# Patient Record
Sex: Female | Born: 1955 | Race: White | Hispanic: No | Marital: Married | Smoking: Never smoker
Health system: Southern US, Community
[De-identification: ages and names within clinical notes are randomized; demographics above are authoritative.]

## PROBLEM LIST (undated history)

## (undated) DIAGNOSIS — I340 Nonrheumatic mitral (valve) insufficiency: Secondary | ICD-10-CM

## (undated) DIAGNOSIS — IMO0002 Reserved for concepts with insufficient information to code with codable children: Secondary | ICD-10-CM

## (undated) DIAGNOSIS — D649 Anemia, unspecified: Secondary | ICD-10-CM

## (undated) DIAGNOSIS — K219 Gastro-esophageal reflux disease without esophagitis: Secondary | ICD-10-CM

## (undated) DIAGNOSIS — R079 Chest pain, unspecified: Secondary | ICD-10-CM

## (undated) DIAGNOSIS — F419 Anxiety disorder, unspecified: Secondary | ICD-10-CM

## (undated) DIAGNOSIS — Z87898 Personal history of other specified conditions: Secondary | ICD-10-CM

## (undated) DIAGNOSIS — R9430 Abnormal result of cardiovascular function study, unspecified: Secondary | ICD-10-CM

## (undated) DIAGNOSIS — J45909 Unspecified asthma, uncomplicated: Secondary | ICD-10-CM

## (undated) DIAGNOSIS — R55 Syncope and collapse: Secondary | ICD-10-CM

## (undated) DIAGNOSIS — R002 Palpitations: Secondary | ICD-10-CM

## (undated) DIAGNOSIS — K589 Irritable bowel syndrome without diarrhea: Secondary | ICD-10-CM

## (undated) HISTORY — DX: Irritable bowel syndrome, unspecified: K58.9

## (undated) HISTORY — DX: Reserved for concepts with insufficient information to code with codable children: IMO0002

## (undated) HISTORY — PX: UPPER GASTROINTESTINAL ENDOSCOPY: SHX188

## (undated) HISTORY — DX: Nonrheumatic mitral (valve) insufficiency: I34.0

## (undated) HISTORY — PX: COLONOSCOPY: SHX174

## (undated) HISTORY — DX: Syncope and collapse: R55

## (undated) HISTORY — PX: CHOLECYSTECTOMY: SHX55

## (undated) HISTORY — DX: Gastro-esophageal reflux disease without esophagitis: K21.9

## (undated) HISTORY — PX: STOMACH SURGERY: SHX791

## (undated) HISTORY — DX: Abnormal result of cardiovascular function study, unspecified: R94.30

## (undated) HISTORY — DX: Chest pain, unspecified: R07.9

## (undated) HISTORY — PX: ABDOMINAL HYSTERECTOMY: SHX81

## (undated) HISTORY — DX: Palpitations: R00.2

## (undated) HISTORY — DX: Unspecified asthma, uncomplicated: J45.909

---

## 2001-02-14 ENCOUNTER — Ambulatory Visit (HOSPITAL_COMMUNITY): Admission: RE | Admit: 2001-02-14 | Discharge: 2001-02-14 | Payer: Self-pay | Admitting: Internal Medicine

## 2001-07-31 ENCOUNTER — Ambulatory Visit (HOSPITAL_COMMUNITY): Admission: RE | Admit: 2001-07-31 | Discharge: 2001-07-31 | Payer: Self-pay | Admitting: Internal Medicine

## 2001-10-18 ENCOUNTER — Encounter (INDEPENDENT_AMBULATORY_CARE_PROVIDER_SITE_OTHER): Payer: Self-pay | Admitting: Internal Medicine

## 2001-10-18 ENCOUNTER — Ambulatory Visit (HOSPITAL_COMMUNITY): Admission: RE | Admit: 2001-10-18 | Discharge: 2001-10-18 | Payer: Self-pay | Admitting: Internal Medicine

## 2001-11-02 ENCOUNTER — Ambulatory Visit (HOSPITAL_COMMUNITY): Admission: RE | Admit: 2001-11-02 | Discharge: 2001-11-02 | Payer: Self-pay | Admitting: Internal Medicine

## 2002-05-27 ENCOUNTER — Ambulatory Visit (HOSPITAL_COMMUNITY): Admission: RE | Admit: 2002-05-27 | Discharge: 2002-05-27 | Payer: Self-pay | Admitting: Oral Surgery

## 2002-05-27 ENCOUNTER — Encounter: Payer: Self-pay | Admitting: Oral Surgery

## 2003-03-29 ENCOUNTER — Encounter: Payer: Self-pay | Admitting: Cardiology

## 2003-07-11 ENCOUNTER — Ambulatory Visit (HOSPITAL_COMMUNITY): Admission: RE | Admit: 2003-07-11 | Discharge: 2003-07-11 | Payer: Self-pay | Admitting: Internal Medicine

## 2004-03-19 ENCOUNTER — Ambulatory Visit (HOSPITAL_COMMUNITY): Admission: RE | Admit: 2004-03-19 | Discharge: 2004-03-19 | Payer: Self-pay | Admitting: Internal Medicine

## 2004-04-13 ENCOUNTER — Ambulatory Visit (HOSPITAL_COMMUNITY): Admission: RE | Admit: 2004-04-13 | Discharge: 2004-04-13 | Payer: Self-pay | Admitting: Internal Medicine

## 2004-04-23 ENCOUNTER — Ambulatory Visit (HOSPITAL_COMMUNITY): Admission: RE | Admit: 2004-04-23 | Discharge: 2004-04-23 | Payer: Self-pay | Admitting: Internal Medicine

## 2004-09-03 ENCOUNTER — Ambulatory Visit: Payer: Self-pay | Admitting: Internal Medicine

## 2004-09-28 ENCOUNTER — Ambulatory Visit: Payer: Self-pay | Admitting: Internal Medicine

## 2004-09-28 ENCOUNTER — Ambulatory Visit (HOSPITAL_COMMUNITY): Admission: RE | Admit: 2004-09-28 | Discharge: 2004-09-28 | Payer: Self-pay | Admitting: Internal Medicine

## 2004-11-16 ENCOUNTER — Ambulatory Visit: Payer: Self-pay | Admitting: Internal Medicine

## 2005-02-22 ENCOUNTER — Ambulatory Visit: Payer: Self-pay | Admitting: Internal Medicine

## 2005-03-17 ENCOUNTER — Ambulatory Visit: Payer: Self-pay | Admitting: Internal Medicine

## 2005-03-17 ENCOUNTER — Ambulatory Visit (HOSPITAL_COMMUNITY): Admission: RE | Admit: 2005-03-17 | Discharge: 2005-03-17 | Payer: Self-pay | Admitting: Internal Medicine

## 2005-06-14 ENCOUNTER — Ambulatory Visit: Payer: Self-pay | Admitting: Internal Medicine

## 2005-09-08 ENCOUNTER — Ambulatory Visit: Payer: Self-pay | Admitting: Internal Medicine

## 2005-09-08 ENCOUNTER — Ambulatory Visit (HOSPITAL_COMMUNITY): Admission: RE | Admit: 2005-09-08 | Discharge: 2005-09-08 | Payer: Self-pay | Admitting: Internal Medicine

## 2005-09-20 ENCOUNTER — Ambulatory Visit: Payer: Self-pay | Admitting: Internal Medicine

## 2006-02-21 ENCOUNTER — Ambulatory Visit: Payer: Self-pay | Admitting: Internal Medicine

## 2006-03-15 ENCOUNTER — Ambulatory Visit: Payer: Self-pay | Admitting: Internal Medicine

## 2006-04-13 ENCOUNTER — Ambulatory Visit: Payer: Self-pay | Admitting: Internal Medicine

## 2006-04-28 ENCOUNTER — Ambulatory Visit: Payer: Self-pay | Admitting: Internal Medicine

## 2006-05-02 ENCOUNTER — Ambulatory Visit (HOSPITAL_COMMUNITY): Admission: RE | Admit: 2006-05-02 | Discharge: 2006-05-02 | Payer: Self-pay | Admitting: Internal Medicine

## 2006-05-02 ENCOUNTER — Ambulatory Visit: Payer: Self-pay | Admitting: Internal Medicine

## 2006-05-02 ENCOUNTER — Encounter (INDEPENDENT_AMBULATORY_CARE_PROVIDER_SITE_OTHER): Payer: Self-pay | Admitting: Specialist

## 2006-06-29 ENCOUNTER — Ambulatory Visit: Payer: Self-pay | Admitting: Internal Medicine

## 2006-10-19 ENCOUNTER — Ambulatory Visit: Payer: Self-pay | Admitting: Internal Medicine

## 2007-02-16 ENCOUNTER — Ambulatory Visit: Payer: Self-pay | Admitting: Internal Medicine

## 2007-06-13 ENCOUNTER — Encounter: Payer: Self-pay | Admitting: Cardiology

## 2007-06-13 ENCOUNTER — Ambulatory Visit: Payer: Self-pay | Admitting: Family Medicine

## 2007-06-19 ENCOUNTER — Ambulatory Visit: Payer: Self-pay | Admitting: Cardiology

## 2007-07-24 ENCOUNTER — Ambulatory Visit: Payer: Self-pay | Admitting: Cardiology

## 2007-08-28 ENCOUNTER — Encounter: Payer: Self-pay | Admitting: Physician Assistant

## 2007-08-28 ENCOUNTER — Ambulatory Visit: Payer: Self-pay | Admitting: Cardiology

## 2007-09-05 ENCOUNTER — Encounter: Payer: Self-pay | Admitting: Cardiology

## 2007-09-18 ENCOUNTER — Ambulatory Visit: Payer: Self-pay | Admitting: Cardiology

## 2007-10-02 ENCOUNTER — Encounter: Payer: Self-pay | Admitting: Cardiology

## 2007-10-02 ENCOUNTER — Ambulatory Visit (HOSPITAL_COMMUNITY): Admission: RE | Admit: 2007-10-02 | Discharge: 2007-10-02 | Payer: Self-pay | Admitting: Cardiology

## 2007-10-02 ENCOUNTER — Ambulatory Visit: Payer: Self-pay | Admitting: Internal Medicine

## 2007-10-20 ENCOUNTER — Encounter: Payer: Self-pay | Admitting: Cardiology

## 2007-10-30 ENCOUNTER — Ambulatory Visit: Payer: Self-pay | Admitting: Cardiology

## 2009-03-18 ENCOUNTER — Encounter: Payer: Self-pay | Admitting: Cardiology

## 2009-03-27 ENCOUNTER — Encounter: Payer: Self-pay | Admitting: Cardiology

## 2009-05-16 ENCOUNTER — Encounter: Payer: Self-pay | Admitting: Cardiology

## 2009-05-26 ENCOUNTER — Ambulatory Visit: Payer: Self-pay | Admitting: Cardiology

## 2009-05-26 DIAGNOSIS — E785 Hyperlipidemia, unspecified: Secondary | ICD-10-CM

## 2009-05-26 DIAGNOSIS — R002 Palpitations: Secondary | ICD-10-CM

## 2009-05-26 DIAGNOSIS — R55 Syncope and collapse: Secondary | ICD-10-CM

## 2009-05-26 DIAGNOSIS — K222 Esophageal obstruction: Secondary | ICD-10-CM | POA: Insufficient documentation

## 2009-05-26 DIAGNOSIS — K589 Irritable bowel syndrome without diarrhea: Secondary | ICD-10-CM

## 2009-05-26 DIAGNOSIS — I2789 Other specified pulmonary heart diseases: Secondary | ICD-10-CM | POA: Insufficient documentation

## 2009-06-06 ENCOUNTER — Telehealth: Payer: Self-pay | Admitting: Physician Assistant

## 2009-07-04 ENCOUNTER — Encounter: Payer: Self-pay | Admitting: Cardiology

## 2009-07-05 ENCOUNTER — Encounter: Payer: Self-pay | Admitting: Cardiology

## 2009-07-07 ENCOUNTER — Telehealth (INDEPENDENT_AMBULATORY_CARE_PROVIDER_SITE_OTHER): Payer: Self-pay | Admitting: *Deleted

## 2009-07-07 ENCOUNTER — Encounter: Payer: Self-pay | Admitting: Cardiology

## 2009-07-14 ENCOUNTER — Encounter: Payer: Self-pay | Admitting: Cardiology

## 2009-07-14 ENCOUNTER — Ambulatory Visit: Payer: Self-pay | Admitting: Cardiology

## 2009-08-04 ENCOUNTER — Encounter: Payer: Self-pay | Admitting: Cardiology

## 2009-08-18 ENCOUNTER — Encounter: Payer: Self-pay | Admitting: Cardiology

## 2009-10-20 ENCOUNTER — Encounter: Payer: Self-pay | Admitting: Cardiology

## 2009-10-22 ENCOUNTER — Ambulatory Visit: Payer: Self-pay | Admitting: Cardiology

## 2009-10-22 DIAGNOSIS — R0602 Shortness of breath: Secondary | ICD-10-CM

## 2009-10-22 DIAGNOSIS — R0789 Other chest pain: Secondary | ICD-10-CM

## 2009-11-11 DEATH — deceased

## 2009-12-29 ENCOUNTER — Encounter: Payer: Self-pay | Admitting: Cardiology

## 2010-01-14 ENCOUNTER — Encounter: Payer: Self-pay | Admitting: Cardiology

## 2010-04-06 ENCOUNTER — Encounter: Payer: Self-pay | Admitting: Cardiology

## 2010-04-17 ENCOUNTER — Encounter: Payer: Self-pay | Admitting: Cardiology

## 2010-05-11 ENCOUNTER — Encounter: Payer: Self-pay | Admitting: Cardiology

## 2010-05-22 ENCOUNTER — Encounter: Payer: Self-pay | Admitting: Cardiology

## 2010-06-04 ENCOUNTER — Ambulatory Visit: Payer: Self-pay | Admitting: Cardiology

## 2010-06-04 ENCOUNTER — Encounter (INDEPENDENT_AMBULATORY_CARE_PROVIDER_SITE_OTHER): Payer: Self-pay | Admitting: *Deleted

## 2010-07-13 ENCOUNTER — Ambulatory Visit: Payer: Self-pay | Admitting: Internal Medicine

## 2010-07-24 ENCOUNTER — Ambulatory Visit: Payer: Self-pay | Admitting: Internal Medicine

## 2010-07-24 ENCOUNTER — Ambulatory Visit (HOSPITAL_COMMUNITY): Admission: RE | Admit: 2010-07-24 | Discharge: 2010-07-24 | Payer: Self-pay | Admitting: Internal Medicine

## 2010-08-31 ENCOUNTER — Ambulatory Visit: Payer: Self-pay | Admitting: Internal Medicine

## 2010-10-13 NOTE — Assessment & Plan Note (Signed)
Summary: 6 month fu recv reminder   Visit Type:  Follow-up Primary Provider:  Dr. Doreen Beam   History of Present Illness: 55 year old female, with no documented history of CAD, but with history of long-standing chest pain and chronic dyspnea, presents for scheduled followup.  Since her last visit, she has undergone extensive workup: She was briefly hospitalized here at [facility] in October for chest pain, and was released with plans for an outpatient stress test. This was once again negative, as had been the case in October 2008. She also had normal LVF by 2-D echo; however, it suggested a dilated IVC, and a followup ultrasound was ordered. This suggested only mildly dilated IVC, with no evidence of occlusion. She also underwent recent PFT testing, per Dr. Sherril Croon, which indicated normal lung volumes and moderate decrease in diffusing capacity. Of note, she had had previous formal PFTs, which we ordered, which indicated mild airflow obstruction (Gold stage II), with normal DLCO.   In addition, patient also has history of mild pulmonary hypertension by previous echocardiography. This current study, however, did not suggest any significant findings. Patient has also been formally evaluated by Dr. Cherie Ouch, in the past.  Clinically, she continues to complain of intermittent chest pain, which is unpredictable in onset and brief in duration. She also complains of chronic dyspnea. At time of last visit, she was placed on low dose Toprol for palpitations, but subsequently decreased this to 12.5 mg daily, citing low blood pressure readings at home. She also has not had any recurrent syncope, which was felt to be vasovagal in nature.    Preventive Screening-Counseling & Management  Alcohol-Tobacco     Smoking Status: never  Current Medications (verified): 1)  Nexium 40 Mg Cpdr (Esomeprazole Magnesium) .... Take 1 Capsule By Mouth Two Times A Day 2)  Alprazolam 0.5 Mg Tabs (Alprazolam) .... Take  1 Tablet By Mouth Two Times A Day As Needed 3)  Bentyl 20 Mg Tabs (Dicyclomine Hcl) .... Take 1 Tablet By Mouth Twice Times A Day 4)  Multivitamins   Tabs (Multiple Vitamin) .... Daily 5)  Vitamin B-12 2500 Mcg Subl (Cyanocobalamin) .... Take 1/2 Daily 6)  Vitamin B Complex-C  Caps (B Complex-C) .... Take 1 Capsule By Mouth Once A Day 7)  Toprol Xl 25 Mg Xr24h-Tab (Metoprolol Succinate) .... Take 1 Tablet By Mouth Once A Day 8)  Sucralfate 1 Gm Tabs (Sucralfate) .... Take One By Mouth Ac & Hs 9)  Azelastine Hcl 137 Mcg/spray Soln (Azelastine Hcl) .... Two Sprays/nostrils Two Times A Day 10)  Ventolin Hfa 108 (90 Base) Mcg/act Aers (Albuterol Sulfate) .... As Needed 11)  Trazodone Hcl 50 Mg Tabs (Trazodone Hcl) .... Take 1 Tablet By Mouth Once A Day At Betime As Needed 12)  Anucort-Hc 25 Mg Supp (Hydrocortisone Acetate) .... As Needed 13)  Atrovent Hfa 17 Mcg/act Aers (Ipratropium Bromide Hfa) .... One Puff As Needed  Allergies (verified): 1)  Darvocet 2)  Codeine 3)  * Fentanyl  Comments:  Nurse/Medical Assistant: The patient's medications and allergies were reviewed with the patient and were updated in the Medication and Allergy Lists. Bottles reviewed.  Past History:  Past Medical History: Last updated: 05/26/2009  1. She has chronic GERD.and stricture  2. History of gastric ulcer which was subsequently documented to have      healed completely.  Her H pylori serologies have been negative.  3. She also has history of dysphagia.  Last EGD was in December2006.  She  had mild changes of reflux esophagitis, limited GE junction and      incomplete ring. dyslipidemia multiple pulmonary hypertension anemia resolved irritable bowel syndrome chronic dyspnea status-post CPX  Review of Systems       No fevers, chills, hemoptysis, dysphagia, melena, hematocheezia, hematuria, rash, claudication, orthopnea, pnd, pedal edema. no recurrent syncope. Occasional palpitations. All other  systems negative.   Vital Signs:  Patient profile:   55 year old female Height:      64 inches Weight:      108 pounds Pulse rate:   69 / minute BP sitting:   110 / 74  (left arm) Cuff size:   regular  Vitals Entered By: Carlye Grippe (October 22, 2009 10:37 AM)   Physical Exam  Additional Exam:  GEN:55 year old female, sitting upright, in no distress HEENT: NCAT,PERRLA,EOMI NECK: palpable pulses, no bruits; no JVD; no TM LUNGS: CTA bilaterally HEART: RRR (S1S2); no significant murmurs; no rubs; no gallops ABD: soft, NT; intact BS EXT: intact distal pulses; no edema SKIN: warm, dry MUSC: no obvious deformity NEURO: A/O (x3)     Impression & Recommendations:  Problem # 1:  CHEST PAIN, ATYPICAL (ICD-786.59)  patient has long-standing history of atypical chest pain, and most recently had her second exercise stress Cardiolite, once again negative for ischemia. She does not suggest any change from her baseline pattern; therefore, no further ischemic testing is indicated. Moreover, a recent 2-D echo was also, once again, within normal limits. There was some suggestion of a dilated IVC; however, followup ultrasound suggested that this was only mildly dilated, and with no evidence of obstruction. This workup was initiated by Dr. Sherril Croon. We'll plan on having her return to Dr. Andee Lineman in 6 months, or sooner if needed.  Problem # 2:  PALPITATIONS, RECURRENT (ICD-785.1)  continue low dose Toprol.  Problem # 3:  DYSPNEA (ICD-786.05)  Will substitute albuterol with Atrovent MDI, to minimize the potential for inducing tachypalpitations.  Patient Instructions: 1)  Stop Albuterol MDI 2)  Atrovent MDI - may use one puff as needed  3)  Follow up in  6 months.   Prescriptions: ATROVENT HFA 17 MCG/ACT AERS (IPRATROPIUM BROMIDE HFA) one puff as needed  #1 x 0   Entered by:   Hoover Brunette, LPN   Authorized by:   Lewayne Bunting, MD, [facility]   Signed by:   Hoover Brunette, LPN on 16/06/9603   Method  used:   Electronically to        [facility] (retail)       [address]       [place], [place]  [address]       Ph: [phone]       Fax: [phone]   RxID:   [id number]   Handout requested.

## 2010-10-13 NOTE — Letter (Signed)
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 [address]   [place], [place] [address]   Phone: [phone]  Fax: [phone]     June 04, 2010    [facility]   The above named patient had a medical visit today at:  [facility].  Please take this into consideration when reviewing the time away from work/school.      Sincerely yours,  Architectural technologist

## 2010-10-13 NOTE — Assessment & Plan Note (Signed)
Summary: 6 mo fu per aug reminder   Visit Type:  Follow-up Primary Lindsay Lawrence:  Dr. Doreen Beam   History of Present Illness: the patient is a 55 year old female with no prior history of documented CAD.  She has a long-standing history of chest pain and chronic dyspnea.  Please see details note from February 2011.  The patient had a negative Lexiscan in November of 2010.  She complains of reflux and possible surgical spasm.  She has been using nitroglycerin with some success.  However she has no exertional chest pain.  From a cardiovascular standpoint she appears to be stable.  Preventive Screening-Counseling & Management  Alcohol-Tobacco     Smoking Status: never  Current Medications (verified): 1)  Nexium 40 Mg Cpdr (Esomeprazole Magnesium) .... Take 1 Capsule By Mouth Two Times A Day 2)  Alprazolam 0.5 Mg Tabs (Alprazolam) .... Take 1 Tablet By Mouth Two Times A Day As Needed 3)  Multivitamins   Tabs (Multiple Vitamin) .... Daily 4)  Vitamin B-12 2500 Mcg Subl (Cyanocobalamin) .... Take 1/2 Daily 5)  Vitamin B Complex-C  Caps (B Complex-C) .... Take 1 Capsule By Mouth Once A Day 6)  Toprol Xl 25 Mg Xr24h-Tab (Metoprolol Succinate) .... Take 1/2 Tablet By Mouth Once A Day 7)  Amitriptyline Hcl 10 Mg Tabs (Amitriptyline Hcl) .... Take 1 Tablet By Mouth Once A Day 8)  Flonase 50 Mcg/act Susp (Fluticasone Propionate) .... As Needed 9)  Ergocalciferol 8000 Unit/ml Soln (Ergocalciferol) .... Take 1.75ml By Mouth Once Daily. 10)  Fiber 625 Mg Tabs (Calcium Polycarbophil) .... Take 1 Tablet By Mouth Four Times A Day 11)  Magnesium 250 Mg Tabs (Magnesium) .... Take 1 Tablet By Mouth Once A Day 12)  Canasa 1000 Mg Supp (Mesalamine) .... As Needed At Bedtime 13)  Nitrostat 0.4 Mg Subl (Nitroglycerin) .... Dissolve One Tablet Under Tongue For Severe Chest Pain As Needed Every 5 Minutes, Not To Exceed 3 in 15 Min Time Frame  Allergies: 1)  Darvocet 2)  Codeine 3)  * Fentanyl 4)  Asa 5)   Nsaids  Comments:  Nurse/Medical Assistant: The patient's medications were reviewed with the patient and were updated in the Medication List. Pt brought medication bottles to office visit.  Cyril Loosen, RN, BSN (June 04, 2010 3:04 PM)  Past History:  Past Surgical History: Last updated: 05/23/2009  hysterectomy   Family History: Last updated: 05/23/2009   She has one brother and three sisters and they all have  abdominal problems but details unknown.  Father had MI at 108 and died at 69  of another MI.  Mother is doing fairly well at 69.  Social History: Last updated: 05/23/2009  She is married.  She has been working at [employer] in [place],  [place], for the last 13 years.  She does not have any children.  She  has never smoked cigarettes or drank alcohol.  Her husband has Crohn's  disease and has been sick for years.  Risk Factors: Smoking Status: never (06/04/2010)  Past Medical History:  1. She has chronic GERD.and stricture  2. History of gastric ulcer which was subsequently documented to have      healed completely.  Her H pylori serologies have been negative.  3. She also has history of dysphagia.  Last EGD was in December2006.  She      had mild changes of reflux esophagitis, limited GE junction and      incomplete ring. dyslipidemia multiple pulmonary hypertension anemia resolved  irritable bowel syndrome chronic dyspnea status-post CPX, mildly decreased DLCO but otherwise normal pulmonary function studies vitamin D deficiency esophageal spasm.  Review of Systems       The patient complains of chest pain and dyspnea on exertion.  The patient denies fatigue, malaise, fever, weight gain/loss, vision loss, decreased hearing, hoarseness, palpitations, shortness of breath, prolonged cough, wheezing, sleep apnea, coughing up blood, abdominal pain, blood in stool, nausea, vomiting, diarrhea, heartburn, incontinence, blood in urine, muscle weakness, joint  pain, leg swelling, rash, skin lesions, headache, fainting, dizziness, depression, anxiety, enlarged lymph nodes, easy bruising or bleeding, and environmental allergies.    Vital Signs:  Patient profile:   55 year old female Height:      63 inches Weight:      114.25 pounds BMI:     20.31 Pulse rate:   61 / minute BP sitting:   93 / 64  (left arm) Cuff size:   regular  Vitals Entered By: Cyril Loosen, RN, BSN (June 04, 2010 2:58 PM) Comments Follow up office visit. No cardiac complaints.   Physical Exam  Additional Exam:  GEN:55 year old female, sitting upright, in no distress HEENT: NCAT,PERRLA,EOMI NECK: palpable pulses, no bruits; no JVD; no TM LUNGS: CTA bilaterally HEART: RRR (S1S2); no significant murmurs; no rubs; no gallops ABD: soft, NT; intact BS EXT: intact distal pulses; no edema SKIN: warm, dry MUSC: no obvious deformity NEURO: A/O (x3)     EKG  Procedure date:  06/04/2010  Findings:      sinus rhythm with occasional PVCs otherwise normal tracing heart rate 72 beats/min  Impression & Recommendations:  Problem # 1:  DYSPNEA (ICD-786.05) no evidence of ischemia.  EKG is within normal limits.  Negative stress test in the recent past.  Abnormal DLCO otherwise normal pulmonary function studies Her updated medication list for this problem includes:    Toprol Xl 25 Mg Xr24h-tab (Metoprolol succinate) .Marland Kitchen... Take 1/2 tablet by mouth once a day  Problem # 2:  ESOPHAGEAL STRICTURE (ICD-530.3) patient appearsto have esophageall spasm.  She can use p.r.n. nitroglycerin.  Problem # 3:  PALPITATIONS, RECURRENT (ICD-785.1) stable. Her updated medication list for this problem includes:    Toprol Xl 25 Mg Xr24h-tab (Metoprolol succinate) .Marland Kitchen... Take 1/2 tablet by mouth once a day    Nitrostat 0.4 Mg Subl (Nitroglycerin) .Marland Kitchen... Dissolve one tablet under tongue for severe chest pain as needed every 5 minutes, not to exceed 3 in 15 min time frame  Patient  Instructions: 1)  Nitroglycerin as needed for severe chest pain 2)  Follow up in  1 year Prescriptions: TOPROL XL 25 MG XR24H-TAB (METOPROLOL SUCCINATE) Take 1/2 tablet by mouth once a day  #15 x 11   Entered by:   Hoover Brunette, LPN   Authorized by:   Lewayne Bunting, MD, [facility]   Signed by:   Hoover Brunette, LPN on 09/81/1914   Method used:   Electronically to        [facility] ([facility])       [address]       [place], [place]  [address]       Ph: [phone]       Fax: [phone]   RxID:   [id number] NITROSTAT 0.4 MG SUBL (NITROGLYCERIN) dissolve one tablet under tongue for severe chest pain as needed every 5 minutes, not to exceed 3 in 15 min time frame  #25 x 3  Entered by:   Hoover Brunette, LPN   Authorized by:   Lewayne Bunting, MD, [facility]   Signed by:   Hoover Brunette, LPN on 16/06/9603   Method used:   Electronically to        [facility]ret[facility])       [address]       [place], [place]  [address]       Ph: [phone]       Fax: [phone]   RxID:   [id number]

## 2010-10-13 NOTE — Letter (Signed)
Summary: [facility] D/C DR. DHRUV VYAS  [facility] D[facility]C DR. DHRUV VYAS   Imported By: Zachary George 10/22/2009 09:51:40  _____________________________________________________________________  External Attachment:    Type:   Image     Comment:   External Document

## 2010-11-09 ENCOUNTER — Ambulatory Visit (INDEPENDENT_AMBULATORY_CARE_PROVIDER_SITE_OTHER): Payer: BC Managed Care – PPO | Admitting: Internal Medicine

## 2010-11-09 DIAGNOSIS — Z862 Personal history of diseases of the blood and blood-forming organs and certain disorders involving the immune mechanism: Secondary | ICD-10-CM

## 2010-11-09 DIAGNOSIS — E559 Vitamin D deficiency, unspecified: Secondary | ICD-10-CM

## 2010-11-09 DIAGNOSIS — K273 Acute peptic ulcer, site unspecified, without hemorrhage or perforation: Secondary | ICD-10-CM

## 2010-11-09 DIAGNOSIS — K626 Ulcer of anus and rectum: Secondary | ICD-10-CM

## 2010-11-24 LAB — SALICYLATE LEVEL: Salicylate Lvl: <4 mg/dL (ref 2.8–20.0)

## 2010-12-28 ENCOUNTER — Ambulatory Visit (INDEPENDENT_AMBULATORY_CARE_PROVIDER_SITE_OTHER): Payer: BC Managed Care – PPO | Admitting: Internal Medicine

## 2010-12-28 DIAGNOSIS — R1310 Dysphagia, unspecified: Secondary | ICD-10-CM

## 2010-12-28 DIAGNOSIS — K219 Gastro-esophageal reflux disease without esophagitis: Secondary | ICD-10-CM

## 2011-01-01 ENCOUNTER — Ambulatory Visit (HOSPITAL_COMMUNITY)
Admission: RE | Admit: 2011-01-01 | Discharge: 2011-01-01 | Disposition: A | Discharge status: Home / Self Care | Payer: BC Managed Care – PPO | Source: Ambulatory Visit | Attending: Internal Medicine | Admitting: Internal Medicine

## 2011-01-01 ENCOUNTER — Encounter (HOSPITAL_BASED_OUTPATIENT_CLINIC_OR_DEPARTMENT_OTHER): Payer: BC Managed Care – PPO | Admitting: Internal Medicine

## 2011-01-01 DIAGNOSIS — K222 Esophageal obstruction: Secondary | ICD-10-CM

## 2011-01-01 DIAGNOSIS — R1310 Dysphagia, unspecified: Secondary | ICD-10-CM

## 2011-01-01 DIAGNOSIS — I10 Essential (primary) hypertension: Secondary | ICD-10-CM | POA: Insufficient documentation

## 2011-01-01 DIAGNOSIS — Z79899 Other long term (current) drug therapy: Secondary | ICD-10-CM | POA: Insufficient documentation

## 2011-01-01 DIAGNOSIS — K259 Gastric ulcer, unspecified as acute or chronic, without hemorrhage or perforation: Secondary | ICD-10-CM

## 2011-01-01 DIAGNOSIS — K219 Gastro-esophageal reflux disease without esophagitis: Secondary | ICD-10-CM

## 2011-01-01 DIAGNOSIS — K257 Chronic gastric ulcer without hemorrhage or perforation: Secondary | ICD-10-CM | POA: Insufficient documentation

## 2011-01-17 NOTE — Op Note (Signed)
NAMEKURSTYN, LARIOS                ACCOUNT NO.:  [id number]  MEDICAL RECORD NO.:  [id number]           PATIENT TYPE:  O  LOCATION:  DAYP                          FACILITY:  [facility]  PHYSICIAN:  Lionel December, M.D.    DATE OF BIRTH:  13-Jan-1956  DATE OF PROCEDURE:  01/01/2011 DATE OF DISCHARGE:                              OPERATIVE REPORT   PROCEDURE:  Esophagogastroduodenoscopy with esophageal dilation.  INDICATION:  Lindsay Lawrence is 55 year old Caucasian female with chronic GERD, history of Schatzki's ring who presents with recurrent solid food dysphagia.  She has had multiple dilation in the past most recently in November 2011.  She also has nonhealing peptic ulcer disease. Procedures were reviewed with the patient.  Informed consent was obtained.  MEDS FOR CONSCIOUS SEDATION:  Cetacaine spray for oropharyngeal topical anesthesia, Demerol 40 mg IV, Versed 7 mg IV.  FINDINGS:  Procedure performed in endoscopy suite.  The patient's vital signs and O2 sat were monitored during the procedure and remained stable.  The patient was placed in left lateral recumbent position and Pentax videoscope was passed through oropharynx without any difficulty into esophagus.  Esophagus.  Mucosa of the esophagus was unremarkable.  GE junction was located 40 cm from the incisors and appeared to be noncritical narrowing in this level, however, there was no obvious ring noted.  Stomach.  Other than small amount of bile it was empty.  Distended very well by insufflation.  In the gastric body, there were some linear scars and a 3-mm ulcer along the posterior wall.  In the antrum, there were two ulcers, one was about 5-6 mm along the posterior wall and one distal to it was about 10-12 mm long and 4-5 mm wide.  There was another scar proximal to the smaller ulcer.  Pyloric channel was patent.  However, there were two ulcers involving the pyloric channel, one at 7- 8 o'clock, another one towards 4 o'clock.   One on the right site was maybe 4-5 mm, other one was slightly larger.  Angularis, fundus, and cardia were examined by retroflexion of scope and were normal.  Duodenum.  Bulbar mucosa was normal.  The scar along the medial wall site previous ulceration.  Scope was passed in second part of duodenal mucosa and folds were normal.  Endoscope was withdrawn.  Esophageal dilation was performed by passing 54-French followed by 56- French Maloney dilator to full insertion.  As the dilator was withdrawn, endoscope was passed again and esophageal mucosa reexamined.  There was a very small focal mucosal disruption at GE junction.  Endoscope was withdrawn.  The patient tolerated the procedure well.  FINAL DIAGNOSES: 1. Noncritical narrowing or stricture at GE junction. 2. Esophagus dilated by passing 56-French Maloney dilator. 3. A 3-mm gastric ulcer at body and two slightly larger ulcers at     gastric antrum along with scars.  Some of the ulcers have healed. 4. Two pyloric channel ulcers, previously there was one. 5. Bulbar ulcer has completely healed.  RECOMMENDATIONS: 1. Lindsay Lawrence will continue her usual medications including Nexium 40 mg     twice daily. 2. She will return  for OV in 3 months.     Lionel December, M.D.     NR/MEDQ  D:  01/01/2011  T:  01/02/2011  Job:  [id number]  cc:   Dr. Sherryll Burger  Electronically Signed by Lionel December M.D. on 01/17/2011 09:49:49 PM

## 2011-01-26 NOTE — Assessment & Plan Note (Signed)
[facility]                          [facility] OFFICE NOTE   Lindsay Lawrence, Lindsay Lawrence                       MRN:          [id number]  DATE:09/18/2007                            DOB:          [birth year]    PRIMARY CARDIOLOGIST:  Lindsay Codding, MD, [facility]   REASON FOR VISIT:  Scheduled clinic follow-up.  Please refer to my  previous office note of November 10, for full details.   At that time, the patient returned for continued evaluation of  progressive, significant exertional dyspnea.  She has no known history  of heart disease and had a normal exercise stress Cardiolite this past  October.   I referred her for a repeat 2-D echocardiogram, for close monitoring of  previously noted mild pulmonary hypertension by a 2-D echocardiogram,  performed at Dr. Bonnita Levan office, in August.  The repeat study, reviewed  by Dr. Myrtis Ser, suggested continued mild pulmonary hypertension (33 mmHg)  and otherwise preserved left ventricular function, as well as normal  right ventricular function.  Of note, the echocardiogram in August was  read by Dr. Andee Lineman.   We also ordered a formal PFT with DLCO and room air ABG.  This yielded a  pO2 of 100 on room air and the PFT suggested mild airflow obstruction  (Gold stage II) with normal DLCO.  Blood tests consisting of ANA, ESR,  and rheumatoid factor were all normal.   The patient also informs me today that she has never been exposed to  Phen-Fen products, for weight reduction.  She also further clarifies her  shortness of breath as an inability to take a full breath of air when  she tries to sing in the choir.  As previously noted, she has never  smoked tobacco.   CURRENT MEDICATIONS:  Unchanged from previous office visit.   PHYSICAL EXAMINATION:  Blood pressure 88/65, pulse 67, weight 104.8  (down 6 pounds).  GENERAL:  A 54 year old female, quite thin and somewhat palate, sitting  upright in no distress.  HEENT:  Normocephalic, atraumatic.  NECK:  Palpable bilateral carotid pulses without bruits; no JVD at 90  degrees.  LUNGS:  Clear to auscultation all fields.  HEART:  Regular rhythm (S1, S2).  A soft grade 1-2/6 post holosystolic  murmur at the base.  ABDOMEN:  Benign.  EXTREMITIES:  There is no significant edema.  NEURO:  Flat affect, but no focal deficit.   ASSESSMENT:  1. Persistent, exertional dyspnea.      a.     Normal left ventricular function by repeat 2-D echo.      b.     Normal adequate exercise stress Cardiolite; ejection       fraction of 70%, October 2008.  2. Mild pulmonary hypertension.      a.     By 2-D echocardiography  3. Gastroesophageal reflux disease/esophageal stricture.      a.     Status post dilatation.  4. Asthma.  5. Irritable bowel syndrome.   PLAN:  Following review with Dr. Andee Lineman, recommendations are as follows:  1. Schedule a cardiopulmonary function test  in [place] in the next      several weeks, for further clarification of the etiology of the      persistent exertional dyspnea.  This will also help elucidate if      she has worsening pulmonary hypertension with exertion.  2. Ambulate here in the hallway and check pre/post SAO2 on room air.  3. Baseline 2 view chest x-ray.  4. Follow-up with Dr. Cherie Ouch later this month, as      scheduled.  5. Surveillance echocardiography in 2-3 years, for close monitoring of      mild pulmonary hypertension.  6. Schedule return clinic follow-up with myself and Dr. Andee Lineman in 6      weeks, for review of study results and further recommendations.      Gene Serpe, PA-C  Electronically Signed      Lindsay Codding, MD,FACC  Electronically Signed   GS/MedQ  DD: 09/18/2007  DT: 09/18/2007  Job #: [id number]   cc:   Doreen Beam, MD

## 2011-01-26 NOTE — Assessment & Plan Note (Signed)
NAMEMarland Kitchen  Lindsay, Lawrence                 CHART#:  [id number]   DATE:  02/16/2007                       DOB:  07-26-1956   PRESENTING COMPLAINT:  Abdominal pain and dysphagia.   OBJECTIVE:  The patient is a 55 year old Caucasian female patient of Dr.  Eliberto Ivory with a complicated history, who is here for a scheduled visit.  She was last seen 4 months ago.  She states she has not felt well over  the last 2 weeks.  She has felt tired and exhausted.  She complains of a  crampy pain across the lower abdomen.  She has had some nausea or queasy  feeling but has not been vomiting.  She states she has been able to  maintain her intake, though.  She believes she has a good appetite.  Her  weight is up by a pound and a half since her last visit but still over  30 pounds less than what she had when her symptoms began back in 2002.  She states her bowels move every day.  Lately they have been more  regular than before.  She has occasional blood on the tissue.  She also  complains of dysphagia.  She has had her esophagus dilated a few times,  more recently in December 2006.  She had incomplete ring and focal  esophagitis at GE junction, possibly pill-related.  Her esophagus was  dilated by passing 56 and 58 [place] Maloney dilator.  She states her  heartburn is well-controlled.  She is working full-time at [employer].   She is on:  1. Premarin 0.625 mg daily.  2. MVI daily.  3. Ferrous sulfate 325 mg daily.  4. Xanax 0.25-0.5 mg b.i.d. p.r.n.  5. Calcium with D daily.  6. Nexium 40 mg q.a.m.  7. Dicyclomine 20 mg b.i.d. p.r.n.  8. Citrucel 4 g daily.  9. Maalox p.r.n.   Her prior workup has been quite extensive.  Upper GI with small bowel  follow-through in May 2002, CT abdomen, and again in January 2003,  colonoscopy in June 2002 with removal of a small cecal adenoma.  A  repeat colonoscopy in August 2007 revealing distal proctitis with  ulceration.  This was felt to be a solitary rectal ulcer,  treated with  mesalamine suppositories.  Biopsy from this ulcer reveals nonspecific  inflammation.   She has had a history of gastric ulcer.  She has undergone multiple  EGDs, more recently as above.  She has had positive celiac antibody  panel in November 2003.  She was maintained on a gluten-free diet for  several weeks without symptomatic improvement.  She also had evaluation  at [facility] and it was felt that she does not have celiac disease.  Other  studies include abdominal angiography in July 2003, normal TSH, cortisol  levels.  Her H. pylori serology in the past has been negative.  She also  had a chest CT.  She had laparoscopic cholecystectomy in March 2003.  She had a normal bone density study 5 years ago.   She has a history of iron-deficiency anemia.  Her serum ferritin has  been as low as 4 but has been gradually coming up.   OBJECTIVE:  VITAL SIGNS:  Weight 107-1/2 pounds.  She is 5 feet 4 inches  tall.  Pulse 88 per minute, blood  pressure 98/60, temperature is 98.9.  HEENT:  Conjunctivae are pink.  Sclerae are nonicteric.  Oropharyngeal  mucosa is normal.  NECK:  No neck masses are noted.  LUNGS:  Clear to auscultation.  ABDOMEN:  Flat and soft.  She has some mild tenderness at LLQ area.  Sigmoid colon is palpable.  No guarding noted.  EXTREMITIES:  She does not have peripheral edema or clubbing.   Labs from Feb 09, 2007:  WBC is 5.6, H&H 11.6 and 34.1, platelet count  is 262,000, and MCV is 85.7.  Her serum ferritin is 12 ng/mL.  Her  hemoglobin one year ago was 13.1 and serum ferritin was 13 in January  2008.   ASSESSMENT:  1. Lower abdominal pain.  I feel this is due to irritable bowel      syndrome.  Dicyclomine is not providing much relief.  I am not sure      as to the reason for the flare-up.  She has been on an      antidepressant in the past, which has helped.  I feel this is worth      a try because other therapies are not helping.  2. Dysphagia.  Her  esophagus has been dilated on a few occasions.  If      it gets worse, I will consider EGD with a biopsy to rule out      eosinophilic esophagitis.  This is one diagnosis which has not been      considered; however, this would only account for her dysphagia and      not other symptoms.  3. Weight loss.  She has lost over 30 pounds, but her weight has been      gradually coming up over the last few years.  4. History of iron deficiency.  Her serum ferritin is coming up      although it is quite low.  Her H&H is normal but the trend is one      of slow drop.   PLAN:  1. Will start her on trazodone 50 mg q.h.s.  She will increase her      dose to 75 mg after 2 weeks.  Prescription given for a daily dose      of 150-mg tablets, 30 with 3 refills.  2. As trazodone kicks in, she can start cutting back on her      dicyclomine so she would not get constipated.  She was given      samples of Nexium.  3. A prescription given for Xanax 0.5 mg tablets, 60 with one refill.  4. The patient will call us with a progress report in a few weeks.  5. She will have a CBC repeated in one month from now.       Lionel December, M.D.  Electronically Signed     NR/MEDQ  D:  02/16/2007  T:  02/17/2007  Job:  [id number]   cc:   Weyman Pedro

## 2011-01-26 NOTE — Assessment & Plan Note (Signed)
[facility]                          [facility] OFFICE NOTE   AIMAR, SHREWSBURY                       MRN:          [id number]  DATE:06/13/2007                            DOB:          1955/11/30    PRIMARY CARE PHYSICIAN:  Dr. Sherril Croon.   SUPERVISING CARDIOLOGIST:  Dr. Andee Lineman.   SUMMARY OF HISTORY:  Ms. Chamblin is a 55 year old white female who is  referred by Dr. Sherril Croon for evaluation of her dyspnea.   Ms. Corral describes a long history dating back years of shortness of  breath.  However, more recently in the last couple of years she states  that it is gradually getting worse.  Her shortness of breath may occur  at any time but it particularly noticing that it is worse in the various  seasons, hot weather and with heavy exertion.  She states that it does  not hurt when she takes a deep breath, she states that she feels like  she can not take a deep breath and that she can not get enough air.  Rarely it may be followed by some anterior chest tightness without  radiation, nausea, vomiting or diaphoresis.  When this shortness of  breath resolves the rightness resolves.  She stated a long time ago she  was taking allergy shots for everything and occasionally takes a  p.r.n. Claritin.  She felt that her shots and her allergy medications  had helped her shortness of breath but her most recent symptom seems to  be a lot worse.  She actually states that she has had PFTs over the  summer and these were okay.  She has had an echocardiogram to evaluate  her symptoms, the results of this showed an EF of 60%, could not rule  out diastolic dysfunction and there is evidence of mild pulmonary  hypertension.   Patient states that in her usual activities at work and around the house  she does not have to rest because of shortness of breath or chest  discomfort.  In August she was hospitalized with near syncope and she  was found to be anemic and has followup with  Dr. Dionicia Abler.  She was placed  on iron.  During this time she also noticed some lower extremity edema  but this has not been an issue for her.  She specifically denies any  orthopnea, there is rare PND.   PAST MEDICAL HISTORY:  ALLERGIES INCLUDE CODEINE, FENTANYL, DARVOCET.   CURRENT MEDICATIONS:  1. MiraLax daily.  2. Nexium 40 mg b.i.d.  3. Trazodone 150 a third of a tablet daily.  4. Klor-Con daily.  5. Alprazolam 0.5 daily.  6. Premarin 0.625 daily.  7. Asmanex 220 mcg daily.  8. Hyoscyamine 0.125 b.i.d.  9. Iron.  10.Multivitamins.  11.Albuterol p.r.n.   PAST MEDICAL HISTORY:  1. Notable for remote stress testing that was unremarkable.  2. Asthma.  3. Allergies.  4. Anemia.  5. Irritable bowel syndrome.  6. Esophageal dilatation with negative H. pylori.  7. Peptic ulcer disease/hiatal hernia with GERD.  8. Has a history of hysterectomy, cholecystectomy  and T&A.   SOCIAL HISTORY:  She resides with her husband in [place] in her own home,  they do not have any children.  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  She does not exercise on a regular basis, she tries to  maintain a healthy diet.  She does not take any herbal medications.   FAMILY HISTORY:  Her mother is alive in her 62s, does not have any  health issues.  Her father died at the age of 63 with a second heart  attack.  She has 2 sisters age 61 and 53, alive and well and a brother  in his 59s that is alive and well.   REVIEW OF SYSTEMS:  In addition to the above is notable for longterm  mouth breathing, she states that she is unable to breath through her  nose..  Contacts.  Rare snoring, cough with nonproduction, rare nocturia  and chronic runny nose.  All other systems are unremarkable.   PHYSICAL EXAMINATION:  GENERAL:  Well-nourished, well-developed,  pleasant white female in no apparent distress.  Weight is 108 pounds,  blood pressure 94/65, pulse 70.  HEENT:  Unremarkable.  NECK:  Supple without  thyromegaly, adenopathy, JVD or carotid bruits.  CHEST:  Symmetrical excursion.  LUNGS:  Clear to auscultation.  HEART:  PMI is not displaced, regular rate and rhythm.  I did not  appreciate any murmurs, rubs, clicks or gallops.  I did not appreciate any abdominal or femoral bruits, all pulses are  symmetrical and intact.  ABDOMEN:  Slightly rounded, bowel sounds present without organomegaly,  masses or tenderness.  EXTREMITIES:  Negative cyanosis, clubbing or edema.  MUSCULOSKELETAL:  Neuro is unremarkable.   EKG shows normal sinus rhythm, normal axis.   IMPRESSION:  1. Shortness of breath of uncertain etiology.  2. History as above.   DISPOSITION:  Given her EKG and echocardiogram her symptoms do not sound  cardiac in etiology, however, given the sudden death of her brother with  myocardial infarction and the rare chest discomfort followed by her  onset of shortness of breath I feel a stress Myoview is warranted to  rule out cardiac etiology to her shortness of breath.  Ms. Cardin is  agreeable to proceed.  I explained to her the indications, procedure,  risk and benefits of a stress test.  She is under the understanding if  this is found to be abnormal we would refer her for a cardiac  catheterization.  If the stress test is unremarkable would consider  following up with a pulmonologist, consider a CPX test as well as  following up with an allergist/EENT.  In the meantime if she has any  problems or difficulties she was encouraged to call our office.      Joellyn Rued, PA-C  Electronically Signed      Learta Codding, MD,FACC  Electronically Signed   EW/MedQ  DD: 06/13/2007  DT: 06/13/2007  Job #: [id number]

## 2011-01-26 NOTE — Assessment & Plan Note (Signed)
[facility]                          [facility]ICE NOTE   DASHAWN, GOLDA                       MRN:          [id number]  DATE:07/24/2007                            DOB:          1955/09/24    PRIMARY CARDIOLOGIST:  Dr. Lewayne Bunting.   REASON FOR VISIT:  Scheduled 1 month followup.  Please refer to PA-C  Irving Burton Wilson's initial cardiology consultation note of September 30, for  full details.   At that time, the patient was referred for further evaluation of  significant exertional dyspnea.  According to the patient today, this  has progressed significantly over just this past year.  She presented  with no prior history of heart disease.   A recent 2D echo in Dr. Sherril Croon' office showed normal left ventricular  function, but with mild pulmonary hypertension (40-50 mmHg), mild mitral  regurgitation, and no focal wall motion abnormalities.  The patient was  then referred for an exercise stress Cardiolite, per Joellyn Rued, PA-C,  to rule out ischemic etiology.  The patient was able to exercise 6-1/2  minutes.  She Had no associated chest pain, but did have some  significant shortness of breath, and had normal perfusion images; EF  70%.   All of these results were reviewed in full with the patient today.   The patient has never smoked tobacco and reports no history of  hypertension, diabetes mellitus, or hyperlipidemia.   CURRENT MEDICATIONS:  Unchanged from recent visit.   PHYSICAL EXAMINATION:  Blood pressure 100/62, pulse 69 and regular,  weight 110.  GENERAL:  A 55 year old female, quite thin and somewhat pallid, but in  no distress.  HEENT:  Normocephalic, atraumatic.  NECK:  Palpable bilateral carotid pulses without bruits.  LUNGS:  Clear to auscultation in all fields.  HEART:  Regular rate and rhythm (S1, S2), a soft grade 1/6 to 2/6  holosystolic murmur at the base.  ABDOMEN:  Benign.  EXTREMITIES:  No significant edema.  NEURO:  No focal deficit.   IMPRESSION:  1. Exertional dyspnea.      a.     Normal left ventricular function, by recent echo.      b.     Normal adequate exercise stress Cardiolite.  2. Pulmonary hypertension.  3. Gastroesophageal reflux disease/peptic ulcer disease.  4. Asthma.   PLAN:  Following review with Dr. Andee Lineman, recommendation is to pursue the  following:  1. Surveillance annual 2D echocardiography for close monitoring of      pulmonary hypertension.  2. Formal pulmonary function test with DLCO and room air ABG.  If this      indicates hypoxemia, then plan is to follow up with a CT scan of      the chest for further evaluation (e.g. thromboembolic disease).  3. Check ANA, ESR, and rheumatoid factor.  4. Return clinic followup with myself and Dr. Andee Lineman for review of      these study results, and further recommendations.  Consideration      may be given at that time for a formal pulmonary evaluation with  Dr. Orson Aloe.  Of note, we may also need to review if the patient      had previously been exposed to diet-enhancers (Fen-Phen      medications).      Gene Serpe, PA-C  Electronically Signed      Learta Codding, MD,FACC  Electronically Signed   GS/MedQ  DD: 07/24/2007  DT: 07/25/2007  Job #: [id number]   cc:   Doreen Beam

## 2011-01-26 NOTE — Assessment & Plan Note (Signed)
[facility]                          [facility]   Lindsay Lawrence, Lindsay Lawrence                       MRN:          [id number]  DATE:10/30/2007                            DOB:          03/31/1956    HISTORY OF PRESENT ILLNESS:  The patient is a 55 year old female with a  history of exertional dyspnea.  The patient was evaluated, CPX test  results which we have not available yet.  The patient also had an  ischemia workup which was within normal limits.  She has mild pulmonary  hypertension by repeat echocardiographic testing.  CPX was ordered,  mainly, to rule out deconditioning as a cause of her dyspnea.  The  patient does not appear to have significant cardiovascular disease.  Also, pulmonary function tests were only mildly abnormal with normal  DLCO and mild obstructive parameters.  She denies any substernal chest  pain.  The patient does have significant IBS and has had very poor  appetite.  She has lost weight, and now she also is anemic with a  hemoglobin of 10.5, likely contributing to her dyspnea.   MEDICATIONS:  1. MiraLax.  2. Nexium 40 mg a day.  3. Alprazolam 0.5 daily.  4. Premarin 0.625.  5. Hyoscyamine 0.125 daily.   PHYSICAL EXAMINATION:  VITAL SIGNS:  Blood pressure is 84/60, heart rate  is 61, weight is 105 pounds.  NECK EXAM:  Normal carotid upstroke, no carotid bruit.  LUNGS:  Clear breath sounds bilaterally.  HEART:  Regular rate and rhythm, normal S1, S2.  ABDOMEN:  Soft.  EXTREMITY EXAM:  No cyanosis, clubbing, or edema.   PROBLEM LIST:  1. Exertional dyspnea.      a.     Normal left ventricular function by echo.      b.     Normal stress Cardiolite study with an ejection fraction of       70%.  2. Mild pulmonary hypertension.  3. Gastroesophageal reflux disease/esophageal stricture and status      post dilatation.  4. Asthma.  5. Irritable bowel syndrome.  6. No definite pulmonary disease.   PLAN:  1. The  patient has no definite cardiovascular disease.  We can reorder      an echocardiogram in 1 year to follow up on the mild degree of      pulmonary hypertension.  2. The patient saw Dr. Orson Aloe, also; he felt that the patient does      not have a significant underlying lung disease.  3. From a cardiovascular perspective, no further workup is required      now.  4. I did discuss with the patient her significant weight loss and      anemia, and this will need further evaluation by her primary care      physician.  I have also suggested the patient use Endurox or      accelerate protein supplements.     Learta Codding, MD,FACC  Electronically Signed    GED/MedQ  DD: 10/30/2007  DT: 10/31/2007  Job #: [id number]

## 2011-01-29 NOTE — Op Note (Signed)
NAMEJERALDINE, Lindsay Lawrence                          ACCOUNT NO.:  [id number]   MEDICAL RECORD NO.:  [id number]                   PATIENT TYPE:  AMB   LOCATION:  DAY                                  FACILITY:  [facility]   PHYSICIAN:  Lionel December, M.D.                 DATE OF BIRTH:  [birth year]   DATE OF PROCEDURE:  03/19/2004  DATE OF DISCHARGE:                                  PROCEDURE NOTE   PROCEDURE:  Esophagogastroduodenoscopy.   ENDOSCOPIST:  Lionel December, M.D.   INDICATION:  Omari is a 55 year old Caucasian female with recurrent  abdominal pain with intermittent nausea and vomiting/heaving, who also has  unexplained weight loss of about 50 pounds.  Over the last 2 years, she has  undergone numerous studies.  She was also evaluated at [facility].  She has  history of gastric ulcer which was documented to have healed in 2003.  Now  she is back with epigastric pain which is not responding to therapy.  She is  therefore undergoing diagnostic EGD.  Procedure and risks were reviewed with  the patient; informed consent was obtained.   PREOPERATIVE MEDICATIONS:  Demerol 25 mg IV, Versed 4 mg IV.   FINDINGS:  Procedure performed in endoscopy suite.  Patient's vital signs  and O2 saturations were monitored during the procedure and remained stable.  The patient was placed in the left lateral decubitus position and the  Olympus video scope was passed via oropharynx without any difficulty into  esophagus.   ESOPHAGUS:  Mucosa of the esophagus normal.  Squamocolumnar junction was  unremarkable.   STOMACH:  It was empty and distended very well with insufflation.  Folds in  the proximal stomach are normal.  Examination of the mucosa revealed  multiple erosions at antrum, along with a linear ulcer which was over a  centimeter long but superficial.  Biopsy was taken from this ulcer/angularis  margin for histology.  Pyloric channel was patent.  Angularis, fundus and  cardia were examined by  retroflexing the scope and were normal.   DUODENUM:  Exam of the bulb reveals normal mucosa.  Mucosa and folds in the  second part of the duodenum were normal.  Endoscope was withdrawn.  Patient  tolerated the procedure well.   FINAL DIAGNOSIS:  Erosive antral gastritis with linear superficial ulcer at  antral mucosa, biopsied for histology.   Note:  Her Helicobacter pylori serology in the past has been negative and  she does not take any nonsteroidal anti-inflammatory drugs.   RECOMMENDATIONS:  1. She will continue her PPI at b.i.d. schedule.  2. I will be contact the patient with biopsy results and further     recommendations.      ___________________________________________  Lionel December, M.D.   NR/MEDQ  D:  03/19/2004  T:  03/19/2004  Job:  [id number]

## 2011-01-29 NOTE — Procedures (Signed)
Lindsay Lawrence, Lindsay Lawrence                          ACCOUNT NO.:  [id number]   MEDICAL RECORD NO.:  [id number]                  PATIENT TYPE:  PREC   LOCATION:                                       FACILITY:   PHYSICIAN:  Kelly Bing, M.D.               DATE OF BIRTH:  August 18, 1956   DATE OF PROCEDURE:  04/23/2004  DATE OF DISCHARGE:                                  ECHOCARDIOGRAM   REFERRING PHYSICIAN:  Dr. Karilyn Cota.   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   M-MODE:  1. Aorta 2.2.  2. Left atrium 3.4.  3. Septum 0.7.  4. Posterior wall 0.7.  5. LV diastole 3.9.  6. LV systole 2.5  7. RV diastole 3.1.   FINDINGS:  1. Technically adequate echocardiographic study.  2. Normal left atrium, right atrium, and right ventricle.  3. Very mild aortic valvular sclerosis; normal function.  4. Normal tricuspid valve; trace regurgitation; estimated RV systolic     pressure probably normal.  5. Minimal mitral valve thickening; borderline prolapse; trivial     regurgitation.  6. Normal internal dimension, wall thickness, regional and global function     of the left ventricle.  7. IVC at the upper limits of normal in diameter; size decreases normally     with inspiration.  8. Minimal posterior pericardia effusion.  9. No evidence for right ventricular pressure or volume overload.      ___________________________________________                                            Glorieta Bing, M.D.   RR/MEDQ  D:  04/24/2004  T:  04/24/2004  Job:  [id number]

## 2011-01-29 NOTE — Op Note (Signed)
NAMERUTHE, Lindsay Lawrence                ACCOUNT NO.:  [id number]   MEDICAL RECORD NO.:  [id number]          PATIENT TYPE:  AMB   LOCATION:  DAY                           FACILITY:  [facility]   PHYSICIAN:  Lionel December, M.D.    DATE OF BIRTH:  06/30/1956   DATE OF PROCEDURE:  03/17/2005  DATE OF DISCHARGE:                                 OPERATIVE REPORT   PROCEDURE:  Esophagogastroduodenoscopy with esophageal dilation.   INDICATIONS:  Deby is a 55 year old Caucasian female with recurrent solid  food dysphagia. She has chronic GERD but symptoms well-controlled with  therapy. She had her esophagus dilated back in January this year when she  was found to have inconspicuous ring. She noted relief for a few months.  Barium study was done recently which showed slight narrowing at GE junction  with barium pill hangover. She is therefore undergoing this exam. Procedure  risks were reviewed with the patient, and informed consent was obtained.   PREMEDICATION:  Cetacaine spray for pharyngeal topical anesthesia, Demerol  15 mg IV, Versed 6 mg IV in divided dose.   FINDINGS:  Procedure performed in endoscopy suite. The patient's vital signs  and O2 saturation were monitored during the procedure and remained stable.  The patient was placed in left lateral position and Olympus videoscope was  passed via oropharynx without any difficulty into esophagus.   Esophagus. Mucosa of the esophagus was normal. GE junction was at 38 cm, and  there was no obvious ring or stricture formation.   Stomach. It was empty and distended very well insufflation. Folds of  proximal stomach were normal. Examination mucosa at body, antrum, pyloric  channel as well as angularis, fundus and cardia was normal.   Duodenum. Bulbar mucosa was normal. Scope was passed to the second part of  the duodenum where mucosa and folds were normal. Endoscope was withdrawn.   Esophagus was dilated by passing 56-French Maloney dilator to  full  insertion. As the dilator was withdrawn, endoscope was passed again and  esophagus reexamined. There was a small mucosal tear at GE junction,  indicative of inconspicuous ring. Pictures taken for the record. Endoscope  was withdrawn. The patient tolerated the procedure well.   FINAL DIAGNOSIS:  Inconspicuous distal esophageal ring which was  dilated/disrupted by passing 56-French Maloney dilator.   The rest of the exam was normal.   RECOMMENDATIONS:  She will continue antireflux measures and Nexium at 40 mg  p.o. q.a.m.       NR/MEDQ  D:  03/17/2005  T:  03/17/2005  Job:  [id number]   cc:   Nena Jordan

## 2011-01-29 NOTE — H&P (Signed)
Lindsay, Lawrence                ACCOUNT NO.:  [id number]   MEDICAL RECORD NO.:  [id number]          PATIENT TYPE:  AMB   LOCATION:  DAY                           FACILITY:  [facility]   PHYSICIAN:  Lionel December, M.D.    DATE OF BIRTH:  04-09-1956   DATE OF ADMISSION:  05/02/2006  DATE OF DISCHARGE:  LH                                HISTORY & PHYSICAL   PRESENTING COMPLAINT:  Lower abdominal pain, diarrhea and rectal bleeding.   HISTORY OF PRESENT ILLNESS:  Lindsay Lawrence is a 55 year old Caucasian female  patient of Dr. Magnus Ivan who has been seen fairly regularly in our office  since 2003 with multiple medical problems predominate for abdominal pain and  progressive weight loss.  She has undergone fairly extensive evaluation with  some findings along the way as reviewed under past medical history but  weight loss never reversed.  She also felt to have IBS.  She was last seen  on February 21, 2006, when she was complaining of abdominal pain and diarrhea  and I felt that she had flare-up of her IBS.  Dicyclomine dose was  increased.  Her hemoccult was positive.  Her H&H, however, was normal at  12.8 and 38.3 and ferritin was 16; it was less than 5 over a year ago.  We  repeated three more Hemoccults last month and all of these are positive.  The patient does not feel well.  She has had worsening pain across the lower  abdomen over the last 2 weeks with constant diarrhea.  She had seven stools  yesterday.  She has been passing small amount of fresh blood but yesterday  she passed a moderate amount of blood.  She also noticed some black aspects  but no frank melena.  She does not have a good appetite.  She has been  nauseated but has not experienced vomiting.  However, she has not lost any  weight since her last visit, in fact, her weight is up by 2 pounds, still 40  pounds less than what she used to be in 2003.  There is no history of recent  antibiotic use or travel out of the country.  She has not  had fever,  hematuria or vaginal bleeding.   MEDICATIONS:  1. Premarin 0.625 mg daily.  2. Multivitamins daily.  3. Ferrous sulfate 325 mg daily.  4. Xanax 4.5 mg b.i.d. p.r.n.  5. Calcium with D daily.  6. Nexium 40 mg q.a.m.  7. Dicyclomine 10 mg three times a day.  8. Citrucel one tablespoonful daily.   PAST MEDICAL HISTORY:  1. She has chronic GERD.  2. History of gastric ulcer which was subsequently documented to have      healed completely.  Her H pylori serologies have been negative.  3. She also has history of dysphagia.  Last EGD was in December2006.  She      had mild changes of reflux esophagitis, limited GE junction and      incomplete ring.   Regarding her weight loss, she has had multiple studies including CAT scans  ultrasound,  abdominal angio as well as the TSH levels cortisol level and  chest CT revealing a stable left upper lobe density.   She had colonoscopy in June 2002 with removal of a small cecal adenoma.  Her  last colonoscopy was in October2004 with rectal ulcers and two polyps which  were inflammatory.  Biopsy from these ulcers revealed nonspecific  inflammation with ulceration.  She was treated with Pentasa suppositories  and responded.  She also has irritable bowel syndrome.  History of iron-  deficiency anemia, her H&H has always been normal and serum ferritin has  been coming up with iron therapy.   She had laparoscopic cholecystectomy in February2006 with resolution of her  vomiting and right upper quadrant pain.   She had hysterectomy 8 or 9 years ago.   As far as her abdominal pain and weight loss is concerned, she was also  evaluated.  Her evaluation for celiac disease was negative although at one  point it was felt that she may have celiac disease.   ALLERGIES:  DARVOCET-N 114, CODEINE, OTC antihistamines and fentanyl.   FAMILY HISTORY:  She has one brother and three sisters and they all have  abdominal problems but details unknown.   Father had MI at 62 and died at 62  of another MI.  Mother is doing fairly well at 107.   SOCIAL HISTORY:  She is married.  She has been working at [employer] in [place],  [place], for the last 13 years.  She does not have any children.  She  has never smoked cigarettes or drank alcohol.  Her husband has Crohn's  disease and has been sick for years.   OBJECTIVE:  GENERAL:  A well-developed, thin Caucasian female who is in no  acute distress.  VITAL SIGNS:  She weighs 105 pounds.  She is 5 feet 4 inches tall.  Pulse 80  per minute, blood pressure 104/72, temperature is 98.3.  HEENT:  Conjunctivae is pink.  Sclerae is nonicteric.  Oropharyngeal mucosa  is normal.  No neck masses are noted.  CARDIAC:  Regular rhythm.  Normal S1-S2.  No murmur or gallop noted.  LUNGS:  Clear to auscultation.  ABDOMEN:  Flat.  Bowel sounds are hyperactive.  On palpation she has mild  generalized tenderness but more so across lower abdomen without masses or  organomegaly and no guarding.  RECTAL:  Examination deferred.  She does not have clubbing or edema.   CBC from April 25, 2006, WBC 5.1, H&H is 12.4 and 36.9, platelet count is  215,000 and MCV is 86.9.   ASSESSMENT:  Lindsay Lawrence is a 55 year old Caucasian female who presents with  diarrhea, lower abdominal pain and rectal bleeding.  She has had hemoccult  times four and all of these have been positive.  Her H&H is normal.  She has  history of nonspecific rectal ulcers (October2004) and she had a small cecal  adenoma removed in June2002.  With a presentation one has to be concerned  about colitis or inflammatory bowel disease.  I do not believe that  proctitis alone would explain her diarrhea and lower abdominal pain.   RECOMMENDATIONS:  1. She will continue dicyclomine at 10 mg t.i.d.  2. Colonoscopy to be performed and a pH in the future.  Further      recommendation will depend on endoscopic findings.     Lionel December, M.D.  Electronically  Signed     NR/MEDQ  D:  04/28/2006  T:  04/28/2006  Job:  [id number]   cc:   Nena Jordan  [address]  [place] [address]

## 2011-01-29 NOTE — Op Note (Signed)
NAMELINLEE, CROMIE                ACCOUNT NO.:  [id number]   MEDICAL RECORD NO.:  [id number]          PATIENT TYPE:  AMB   LOCATION:  DAY                           FACILITY:  [facility]   PHYSICIAN:  Lionel December, M.D.    DATE OF BIRTH:  1956/03/08   DATE OF PROCEDURE:  09/28/2004  DATE OF DISCHARGE:                                 OPERATIVE REPORT   PROCEDURE:  Esophagogastroduodenoscopy with esophageal dilation.   INDICATION:  Lucindia is a 55 year old Caucasian female with recurrent  epigastric pain and intermittent nausea and vomiting, who also complains of  dysphagia.  She has a history of gastric ulcer, which was documented to have  healed completely on EGD over a year ago.  She is undergoing diagnostic and  therapeutic procedure.  The procedures were reviewed with the patient,  informed consent was obtained.   PREMEDICATION:  Cetacaine spray for pharyngeal topical anesthesia, Demerol  15 mg IV, Versed 8 mg IV in divided dose.   FINDINGS:  Procedure performed in endoscopy suite.  The patient's vital  signs and O2 saturation were monitored during procedure and remained stable.  The patient was placed in the left lateral recumbent position.  The Olympus  video scope was passed via oropharynx without any difficulty into esophagus.   Esophagus:  Mucosa of the esophagus was normal.  There was erythema at GE  junction, which was at 40 cm from the incisors.  No hernia was noted.   Stomach:  It was empty and distended very well insufflation.  Folds of  proximal stomach normal.  Examination of the mucosa at body, antrum, pyloric  channel as well as angularis, fundus and cardia was normal.   Duodenum:  Examination of the bulbar mucosa was normal.  Scope was passed to  the second and third part of the duodenum, where the mucosa and folds are  normal.  Endoscope was withdrawn.   Esophagus was dilated by passing 54-French Maloney dilator to full  insertion.  As the dilator was withdrawn,  the endoscope was passed again and  mucosa of the esophagus was reexamined. There were two small linear tears at  25-26 cm and another small tear at GE junction.  Endoscope was withdrawn.  The patient tolerated the procedure well.   FINAL DIAGNOSES:  1.  Mild changes of reflux esophagitis limited to the gastroesophageal      junction with inconspicuous ring.  2.  Normal examination of the stomach, first and second part of the      duodenum.  3.  Esophagus dilated by passing 54 French Maloney dilator, resulting in      disruption of the ring along with two small tears at the body of      esophagus.   RECOMMENDATIONS:  1.  She will continue antireflux measures and Nexium at 40 mg p.o. q.a.m..  2.  As planned, will check a sedimentation rate today.      NR/MEDQ  D:  09/28/2004  T:  09/28/2004  Job:  [id number]   cc:   Weyman Pedro, M.D.

## 2011-01-29 NOTE — Op Note (Signed)
[name], Lindsay Lawrence                          ACCOUNT NO.:  [id number]   MEDICAL RECORD NO.:  [id number]                   PATIENT TYPE:  AMB   LOCATION:  DAY                                  FACILITY:  [facility]   PHYSICIAN:  Lionel December, M.D.                 DATE OF BIRTH:  1956-01-17   DATE OF PROCEDURE:  07/11/2003  DATE OF DISCHARGE:                                 OPERATIVE REPORT   PROCEDURE:  Total colonoscopy.   INDICATIONS FOR PROCEDURE:  [name] is a 55 year old Caucasian female who has  not been feeling well for the last 2 1/2 years, she has lost almost 50  pounds. She has had a very extensive workup which is summarized in my note.  She has iron deficiency anemia which was documented 2 1/2 years ago and it  has always been difficult to get her ferritin in double digits. She was  recently seen by Dr. Eliberto Ivory and she was noted to have heme positive stools.  She also has had some hematochezia. She is therefore undergoing diagnostic  colonoscopy. Her last colonoscopy was in June 2002. The procedure and risks  were reviewed with the patient and informed consent was obtained.   PREMEDICATION:  Demerol 25 mg IV, Versed 2 mg IV.   FINDINGS:  The procedure performed in endoscopy suite. The patient's vital  signs and O2 saturations were monitored during the procedure and remained  stable. The patient was placed in the left lateral decubitus position and a  rectal examination performed. No abnormality noted on external or digital  exam. The Olympus videoscope was passed in the rectum and advanced under  direct vision into the sigmoid colon and beyond. Preparation was excellent.  The scope was advanced to the cecum which was identified by the ileocecal  valve and appendiceal orifice. There was a bulging mucosa at the orifice.  This was palpate with the closed biopsy forceps, very soft. Therefore it was  left alone. As the scope was withdrawn, the colonic mucosa was once again  carefully examined and was normal. However in the rectum, there were three  small ulcers, the largest one was about 2 x 5 mm. There was a small polyp  next to one ulcer and there was another tiny polyp. These polyps are oblated  via cold biopsy and submitted in one container. A biopsy was then also taken  from these ulcers. These were so small, the biopsy essentially ablated these  ulcers. The scope was retroflexed to examine the anorectal junction which  was unremarkable. The endoscope was straightened and withdrawn. The patient  tolerated the procedure well.   FINAL DIAGNOSES:  Three small rectal ulcers and two polyps.  These polyps  were ablated via cold biopsy. The rest of the colonoscopy and terminal ileum  was normal.   The finding of rectal ulcers will explain the patient's heme positive stools  and  hematochezia; however, still will have difficulty stating that this  finding will explain all her symptoms.   RECOMMENDATIONS:  She will resume her usual diet. I will be contacting the  patient with the biopsy results and further recommendations.    ADDENDUM:  CBC was checked today and her hemoglobin is 12.6, her hematocrit  is 37.9, WBC is 5.9, platelet count is 197, and MCV is 85.5.      ___________________________________________                                            Lionel December, M.D.   NR/MEDQ  D:  07/11/2003  T:  07/11/2003  Job:  [id number]   cc:   Weyman Pedro, M.D.  [place], [place]

## 2011-01-29 NOTE — Op Note (Signed)
Lindsay Lawrence, Lindsay Lawrence                ACCOUNT NO.:  [id number]   MEDICAL RECORD NO.:  [id number]          PATIENT TYPE:  AMB   LOCATION:  DAY                           FACILITY:  [facility]   PHYSICIAN:  Lionel December, M.D.    DATE OF BIRTH:  June 02, 1956   DATE OF PROCEDURE:  05/02/2006  DATE OF DISCHARGE:                                 OPERATIVE REPORT   PROCEDURE:  Esophagogastroduodenoscopy followed by colonoscopy.   INDICATION:  Lindsay Lawrence is a 55 year old Caucasian female who was seen in the  office last week with diarrhea and rectal bleeding.  She had multiple stools  which were heme-positive.  She has also been experiencing lower abdominal  pain.  She has history of rectal ulcers.  However, she has not had any  problem for about two years.  She was here for colonoscopy, but she is  complaining of intractable burning all the way from throat down to  epigastric area, and she feels that she may have developed injury from the  pills that she took for her prep and she wants me to evaluate her upper GI  tract before colonoscopy is performed.  Both procedures were reviewed with  the patient, and informed consent was obtained.   MEDICATIONS FOR CONSCIOUS SEDATION:  Benzocaine spray for pharyngeal topical  anesthesia, Demerol 50 mg IV, Versed 4 mg IV.   FINDINGS:  Procedure performed in endoscopy suite.  The patient's vital  signs and O2 saturation were monitored during the procedure and remained  stable.   PROCEDURE #1:  Esophagogastroduodenoscopy.  The patient was placed in left  lateral position.  Olympus videoscope was passed via oropharynx without any  difficulty into esophagus.   Esophagus.  Mucosa of the esophagus was normal except there was focal  erythema and friability right at GE junction.  It was a fairly discrete  area.  The GE junction was at 40 cm from the incisors.  No hernia was noted.   Stomach.  It was empty and distended very well with insufflation.  Folds of  proximal  stomach were normal.  Examination mucosa revealed multiple  petechiae at body and antrum and granularity at antrum.  No erosions or  ulcers noted.  Pyloric channel was patent.  Angularis, fundus and cardia  were examined by retroflexing scope and were normal.   Duodenum.  Bulbar mucosa was normal.  Scope was passed into the second part  of the duodenum where mucosa and folds were normal.  Endoscope was  withdrawn.  The patient prepared for procedure #2.   PROCEDURE IN DETAIL:  Colonoscopy rectal examination performed.  No  abnormality noted on external or digital exam.  Olympus videoscope was  placed in rectum and advanced under vision into sigmoid colon and beyond.  Preparation was excellent.  Scope was passed into cecum which was identified  by appendiceal orifice and ileocecal valve.  Pictures taken for the record.  Colonic mucosa was examined for the second time on the way out and was  normal throughout.  Mucosa of proximal rectum was normal.  Distal rectal  mucosa revealed localized  erythema with friability and ulcer and in the  center.  Pictures taken for the record.  Scope was retroflexed to examine  anorectal junction, and this area with proctitis and ulceration was much  better seen on this view.  Biopsy was taken for routine histology.  Endoscope was withdrawn.  The patient tolerated the procedure well.   FINAL DIAGNOSIS:  Focal esophagitis at GE junction, possibly pill induced.   Nonerosive antral gastritis.  H pylori serology in the past have been  negative.   Distal proctitis with ulceration.  Questions __________ rectal ulcer.  Rest  of the exam was normal.  Biopsy taken from this area.   RECOMMENDATIONS:  She will increase her Nexium to 40 mg b.i.d. for 2 weeks  and then back to 40 mg q.a.m.   Canasa suppository 1 g per rectum at bedtime for 30 days.  Prescription  given for a month with 2 refills.  I will be contacting the patient with  results of biopsy and further  recommendations if any.      Lionel December, M.D.  Electronically Signed     NR/MEDQ  D:  05/02/2006  T:  05/03/2006  Job:  [id number]   cc:   Weyman Pedro, M.D.  [place], [place]

## 2011-01-29 NOTE — Op Note (Signed)
NAMEKYNLEE, KOENIGSBERG                ACCOUNT NO.:  [id number]   MEDICAL RECORD NO.:  [id number]          PATIENT TYPE:  AMB   LOCATION:  DAY                           FACILITY:  [facility]   PHYSICIAN:  Lionel December, M.D.    DATE OF BIRTH:  01/12/1956   DATE OF PROCEDURE:  09/08/2005  DATE OF DISCHARGE:                                 OPERATIVE REPORT   PROCEDURE:  Esophagogastroduodenoscopy with esophageal dilation.   INDICATION:  Amandajo is a 55 year old Caucasian female with recurrent solid  food dysphagia. She has chronic GERD, and symptoms are well-controlled with  PPI. Her last EGD/ED was in July this year, and it helped with her dysphagia  for about four to five months. She had barium study recently which was  normal. The patient would like to have her esophagus dilated before she is  referred to tertiary center for manometry and impedance study. Procedure and  risks were reviewed the with patient, abdominal discomfort informed consent  was obtained.   MEDICINES FOR CONSCIOUS SEDATION:  Cetacaine spray for pharyngeal topical  anesthesia, Demerol 25 mg IV, Versed 8 mg IV.   FINDINGS:  Procedure performed in endoscopy suite. The patient's vital signs  and O2 saturation were monitored during the procedure and remained stable.  The patient was placed in left lateral position, and Olympus videoscope was  passed via oropharynx without any difficulty into esophagus. Mucosa of the  esophagus normal. There was incomplete ring and erythema at GE junction  which was at 38 cm from the incisors. This was not critical, however. No  hernia was noted.   Stomach. It was empty and distended very well with insufflation. Folds of  proximal stomach were normal. Examination of mucosa at body, antrum, pyloric  channel as well as angularis, fundus and cardia was normal.   Duodenum. Bulbar mucosa was normal. Scope was passed into the second part of  duodenum where mucosa and folds were normal. Endoscope  was withdrawn.   Esophagus was dilated by passing 56- and 58-French Maloney dilators to full  insertion. As the dilation was completed, endoscope was passed again, and  there was some mucosal disruption right at GE junction. Pictures taken for  the record. Endoscope was withdrawn. The patient tolerated the procedure  well.   FINAL DIAGNOSIS:  Focal esophagitis at GE junction with incomplete ring  which was dilated by passing 56- and 58-French Maloney dilators.   RECOMMENDATIONS:  She will continue anti-reflux measures and Nexium at 40 mg  p.o. q.a.m. She will call the office with progress report in one week. If  dysphagia persists, will send her to Dr. Mellody Memos of [facility] for esophageal  manometry and impedance study.      Lionel December, M.D.  Electronically Signed     NR/MEDQ  D:  09/08/2005  T:  09/08/2005  Job:  [id number]   cc:   Weyman Pedro, M.D.   Nena Jordan  [address]  [place] [address]

## 2011-01-29 NOTE — H&P (Signed)
Lindsay Lawrence, Lindsay Lawrence                ACCOUNT NO.:  [id number]   MEDICAL RECORD NO.:  [id number]           PATIENT TYPE:   LOCATION:                                 FACILITY:   PHYSICIAN:  Lionel December, M.D.    DATE OF BIRTH:  [birth year]   DATE OF ADMISSION:  DATE OF DISCHARGE:  LH                                HISTORY & PHYSICAL   PRESENTING COMPLAINT:  Dysphagia, recurrent abdominal pain with nausea and  vomiting.   HISTORY OF PRESENT ILLNESS:  Lindsay Lawrence is a 55 year old Caucasian female  patient of Dr. Gretel Acre who is here for scheduled visit. Today, she has a  new symptom of dysphagia which started about a month ago. She has had a few  episodes of food impaction that she had to regurgitate in order to get  relief. She reports to her mid sternum as the site of bolus obstruction. She  states her heartburn is well controlled with therapy. She denies hematemesis  or melena. She continues to complain of pain across her upper abdomen and  left lower quadrant. She states she got sick at Thanksgiving. She had a few  episodes of vomiting and epigastric pain. She had more nausea and vomiting  the following day. She finally got better after two days. Pain in her upper  gastric area as well as LLQ and episodes of nausea and vomiting. She has had  three episodes in the last one month. She got sick Thanksgiving Day. This  sickness lasted for 2 to 3 days. She states she had excruciating pain which  doubled her over, and her husband wanted to take her to the emergency room,  but she decided not to because of long wanting time. She had another episode  on August 15, 2004 and last episode was on August 21, 2004. She continues  to complain of constipation, but MiraLax seems to be helping. She has not  lost any weight since the last visit. Her weight loss and abdominal pain  began in May of 2003. She has lost about 55 pounds. She has had very  extensive workup including head CT and MRI, chest and  abdominal CTs,  laparoscopic, abdominal angiography, in addition to EGD, colonoscopy,  ultrasound, gallbladder studies, as well as evaluation at [facility]. Her  thyroid study has been normal as have been cortisol levels. A referral to  tertiary center has been recommended multiple times, but she has declined  it. She also had small-bowel study was within normal limits. At one point,  there was question of her having celiac disease, but her symptoms did not  improve with gluten-free diet. She has gone back on regular diet. Her  helicobacter pylori serology has been negative.   Her gallbladder function was reassessed for the second time in August. She  had right pelvic kidney but no evidence of cholelithiasis. Her IVC was  distended as were the hepatic veins; however, followup echocardiography was  within normal limits. She had repeat HIDA scan, and her EF was 46%. Her EF  back in August 2003 was 88%.   MEDICATIONS:  1.  Premarin 0.625 mg q.d.  2.  B complex q.d.  3.  MVI q.d.  4.  Ferrous sulfate 325 mg p.o. b.i.d.  5.  Xanax 0.5 mg q.d. p.r.n.  6.  Zelnorm 6 mg b.i.d.  7.  Calcium and vitamin D q.d.  8.  Citrucel 1 tablespoonful daily.  9.  Nexium 40 mg q.a.m.  10. Prozac 20 mg q.d.   PAST MEDICAL HISTORY:  1.  She has history of iron-deficiency anemia.  2.  She has had gastric ulcer, and this has been documented to have healed      on followup EGDs. She had EGD in July 2005 and noted to have erosive      gastritis and recurrence of linear superficial ulcer at gastric antrum.      Biopsy showed benign mucosa. No evidence of granulomas. Her recent      colonoscopy was in October 2004 revealing three small rectal ulcers and      two polyps. Biopsy revealed nonspecific inflammation with ulceration.      These polyps were three small ulcers. Biopsy revealed nonspecific      ulceration. She was treated with Rowasa enemas. She has had normal small-      bowel studies.  3.  Irritable  bowel syndrome. She has had problem with diarrhea and/or      constipation.  4.  She has chronic GERD.  5.  She has been on chronic hormone replacement therapy. She has had      hysterectomy about six years ago.   ALLERGIES:  DARVOCET-N 100, CODEINE, AND OTC ANTIHISTAMINES.   FAMILY HISTORY:  Negative for colon carcinoma or liver disease.   SOCIAL HISTORY:  She is married. She works at [employer]. She does not have  any children. She has never smoked cigarettes or drank alcohol.   PHYSICAL EXAMINATION:  GENERAL:  Well-developed, thin, emaciated, Caucasian  female who is in no acute distress. She weighs 92 pounds. She is 5 feet 4  inches tall. Pulse 84 per minute, blood pressure 90/60. She is afebrile.  HEENT:  Conjunctivae is pink. Sclerae is nonicteric. Oropharyngeal mucosa is  normal. No neck masses are noted.  CARDIAC:  Regular rhythm. Normal S1 and S2. No murmur or gallop noted.  LUNGS:  Clear to auscultation.  ABDOMEN:  Flat, soft. She has mild tenderness at LLQ as well as epigastric  area. No organomegaly or masses noted.  RECTAL:  Deferred. She has thin extremities without edema or clubbing.   LABORATORY DATA:  Her labs from June 24, 2004:  TSH was 0.62. Bilirubin  0.2, AP 75, AST 18, ALT 15, albumin 3.3. Nonfasting glucose of 120, BUN 8,  creatinine 0.7, calcium 8.6, WBC 7.1, H and H 12.7 and 37.6, platelet count  342,000, and sed rate was 39.   ASSESSMENT:  Lindsay Lawrence is a 55 year old Caucasian female who remains with upper  and lower abdominal pain and episodic nausea and vomiting. She has lost over  50 pounds in the last three years or so, but now her weight loss seems to  have leveled off. She has had very extensive workup which has been  nonrevealing. A few things that have been found on GI studies, treating her  for peptic ulcer disease or IBS and rectal ulcers has not helped her overall condition. Now, she presents with solid food dysphagia. She is on chronic  acid  suppression. She could have a ring or esophageal motility disorder.   PLAN:  Will  proceed with esophagogastroduodenoscopy and possibly esophageal  dilation.   Will repeat her sed rate at the time of EGD. If sed rate is elevated, we  will do an ANA.   She was also given prescription for MiraLax 17 g q.d. p.r.n., one month's  supply with 5 refills.     Naje   NR/MEDQ  D:  09/03/2004  T:  09/03/2004  Job:  [id number]   cc:   [facility]  Fax: [phone]

## 2011-02-15 ENCOUNTER — Ambulatory Visit (INDEPENDENT_AMBULATORY_CARE_PROVIDER_SITE_OTHER): Payer: BC Managed Care – PPO | Admitting: Internal Medicine

## 2011-02-15 DIAGNOSIS — R109 Unspecified abdominal pain: Secondary | ICD-10-CM

## 2011-02-15 DIAGNOSIS — K270 Acute peptic ulcer, site unspecified, with hemorrhage: Secondary | ICD-10-CM

## 2011-02-15 DIAGNOSIS — Z862 Personal history of diseases of the blood and blood-forming organs and certain disorders involving the immune mechanism: Secondary | ICD-10-CM

## 2011-02-15 DIAGNOSIS — R197 Diarrhea, unspecified: Secondary | ICD-10-CM

## 2011-04-21 ENCOUNTER — Encounter (INDEPENDENT_AMBULATORY_CARE_PROVIDER_SITE_OTHER): Payer: Self-pay

## 2011-04-28 ENCOUNTER — Telehealth (INDEPENDENT_AMBULATORY_CARE_PROVIDER_SITE_OTHER): Payer: Self-pay | Admitting: *Deleted

## 2011-04-28 DIAGNOSIS — D509 Iron deficiency anemia, unspecified: Secondary | ICD-10-CM

## 2011-04-28 DIAGNOSIS — K922 Gastrointestinal hemorrhage, unspecified: Secondary | ICD-10-CM

## 2011-04-28 NOTE — Telephone Encounter (Signed)
Per Dr Karilyn Cota, it's ok to schedule [facility] for GIVENS study, this has been requested by Dr Pamala Hurry in [facility]  GIVENS sch'd 05/05/11 @ 7:30 (7:00), verbal instructions given to [facility]

## 2011-05-03 ENCOUNTER — Ambulatory Visit (HOSPITAL_COMMUNITY)
Admission: RE | Admit: 2011-05-03 | Discharge: 2011-05-03 | Disposition: A | Discharge status: Home / Self Care | Payer: BC Managed Care – PPO | Source: Ambulatory Visit | Attending: Internal Medicine | Admitting: Internal Medicine

## 2011-05-03 ENCOUNTER — Encounter (HOSPITAL_COMMUNITY)
Admission: RE | Disposition: A | Discharge status: Home / Self Care | Payer: Self-pay | Source: Ambulatory Visit | Attending: Internal Medicine

## 2011-05-03 DIAGNOSIS — K921 Melena: Secondary | ICD-10-CM

## 2011-05-03 DIAGNOSIS — K259 Gastric ulcer, unspecified as acute or chronic, without hemorrhage or perforation: Secondary | ICD-10-CM

## 2011-05-03 DIAGNOSIS — K257 Chronic gastric ulcer without hemorrhage or perforation: Secondary | ICD-10-CM | POA: Insufficient documentation

## 2011-05-03 DIAGNOSIS — D509 Iron deficiency anemia, unspecified: Secondary | ICD-10-CM | POA: Insufficient documentation

## 2011-05-03 DIAGNOSIS — K922 Gastrointestinal hemorrhage, unspecified: Secondary | ICD-10-CM

## 2011-05-03 HISTORY — PX: GIVENS CAPSULE STUDY: SHX5432

## 2011-05-03 SURGERY — IMAGING PROCEDURE, GI TRACT, INTRALUMINAL, VIA CAPSULE
Anesthesia: Moderate Sedation

## 2011-05-03 NOTE — OR Nursing (Signed)
Pt states she also takes Canasta 1000mg  supp at hs prn

## 2011-05-03 NOTE — OR Nursing (Signed)
Also takes vitamin d-3 5000 IU daily

## 2011-05-03 NOTE — OR Nursing (Signed)
Pt swallowed capsule without difficulty. Verbalizes understanding of procedure and eating/drinking restrictions. Will return at 1630 today to return equipment.

## 2011-05-07 ENCOUNTER — Encounter (HOSPITAL_COMMUNITY): Payer: Self-pay | Admitting: Internal Medicine

## 2011-05-10 NOTE — Op Note (Signed)
Job [id number]

## 2011-05-10 NOTE — Op Note (Signed)
Lindsay Lawrence, Lindsay Lawrence                ACCOUNT NO.:  [id number]  MEDICAL RECORD NO.:  [id number]  LOCATION:  [facility]                          FACILITY:  [facility]  PHYSICIAN:  Lionel December, M.D.    DATE OF BIRTH:  06/09/1956  DATE OF PROCEDURE:  05/03/2011 DATE OF DISCHARGE:  05/03/2011                              OPERATIVE REPORT   PROCEDURE:  Small bowel Given-capsule study.  INDICATION:  The patient is a 55 year old Caucasian female with complicated GI history who has nonhealing gastric ulcers, iron- deficiency anemia, recurrent GI bleed, both overt and occult who was referred to [facility] for further evaluation.  She is being evaluated by Dr. Catheryn Bacon and his team.  She had EGD and colonoscopy in June 2012.  She had 3 gastric ulcers.  Biopsies revealed benign etiology and H. pylori was negative.  Colonoscopy was unremarkable except left-sided diverticulosis.  She also had a small-bowel follow- through which was normal.  Dr. Catheryn Bacon recommended the patient undergo small bowel Given- capsule study to complete her workup.  Since the patient lives in [place], she requested study to be performed locally to save her next trip. Therefore, this study was performed at [facility].  Procedure risks were reviewed with the patient and informed consent was obtained.  FINDINGS:  The patient was able to swallow Given capsule without any difficulty.  This is an 8-hour study.  Transit times.  First gastric image noted at 7 seconds.  Duodenal bulb reached at 45 minutes and 39 seconds.  Ileocecal valve reached at 3 hours 58 minutes and 19 seconds.  Cecum reached at 3 hours 58 minutes and 23 seconds.  Mucosal findings.  Three small gastric ulcers noted with clean base.  Two of these are close to the pyloric channel, one towards 12 o'clock and another around 2.  These ulcers would correspond to previous endoscopic findings here as well at [facility]. Normal small bowel  mucosa; no submucosal abnormalities either.  FINAL DIAGNOSIS:  Three small gastric ulcers with a clean base or no stigmata of bleeds.  Normal small bowel exam.  RECOMMENDATIONS:  These findings have been reviewed with the patient. She has a followup visit at [facility].          ______________________________ Lionel December, M.D.     NR/MEDQ  D:  05/10/2011  T:  05/10/2011  Job:  [id number]

## 2011-05-31 ENCOUNTER — Ambulatory Visit (INDEPENDENT_AMBULATORY_CARE_PROVIDER_SITE_OTHER): Payer: BC Managed Care – PPO | Admitting: Internal Medicine

## 2011-05-31 ENCOUNTER — Encounter (INDEPENDENT_AMBULATORY_CARE_PROVIDER_SITE_OTHER): Payer: Self-pay | Admitting: Internal Medicine

## 2011-05-31 DIAGNOSIS — D509 Iron deficiency anemia, unspecified: Secondary | ICD-10-CM

## 2011-05-31 DIAGNOSIS — F411 Generalized anxiety disorder: Secondary | ICD-10-CM

## 2011-05-31 DIAGNOSIS — F419 Anxiety disorder, unspecified: Secondary | ICD-10-CM

## 2011-05-31 DIAGNOSIS — K279 Peptic ulcer, site unspecified, unspecified as acute or chronic, without hemorrhage or perforation: Secondary | ICD-10-CM

## 2011-05-31 MED ORDER — SUCRALFATE 1 G PO TABS: 1.0000 g | ORAL_TABLET | Freq: Two times a day (BID) | ORAL | Status: DC

## 2011-05-31 MED ORDER — ALPRAZOLAM 0.5 MG PO TABS: 0.5000 mg | ORAL_TABLET | Freq: Three times a day (TID) | ORAL | Status: DC | PRN

## 2011-05-31 MED ORDER — ESOMEPRAZOLE MAGNESIUM 40 MG PO CPDR: 40.0000 mg | DELAYED_RELEASE_CAPSULE | Freq: Every day | ORAL | Status: DC

## 2011-05-31 NOTE — Patient Instructions (Signed)
Nexium, Carafate and alprazolam prescriptions sent to your pharmacy. CBC and serum ferritin in 2 months.

## 2011-05-31 NOTE — Progress Notes (Signed)
Presenting complaint; followup for peptic ulcer disease abdominal pain and anemia. Subjective; Lindsay Lawrence is 55 year old Caucasian female who presents for scheduled visit. She I last saw in June she also had a given capsule study last month month as recommended by Dr. Catheryn Bacon of [facility]. Back in June she had EGD and a colonoscopy at you and she [facility]. She was documented to have benign gastric ulcers and colonoscopy was normal. Given capsule study revealed 3 gastric ulcers with a clean base but normal small bowel mucosa. She continues to experience epigastric pain as well as pain in left lower quadrant of her abdomen. She has frequent nausea but sporadic vomiting. She is still seeing the bright red blood with her bowel movements. Lately her stools have been formed and she is neither having diarrhea nor constipation her weight has remained stable. She was also treated for URI recently just like her husband she never did have fever. Current medications; Current Outpatient Prescriptions on File Prior to Visit  Medication Sig Dispense Refill  . ALPRAZolam (XANAX) 0.5 MG tablet Take 0.5 mg by mouth 2 (two) times daily as needed.        Marland Kitchen esomeprazole (NEXIUM) 40 MG capsule Take 40 mg by mouth daily before breakfast.        . fluticasone (VERAMYST) 27.5 MCG/SPRAY nasal spray Place 2 sprays into the nose daily.        Marland Kitchen ipratropium (ATROVENT HFA) 17 MCG/ACT inhaler Inhale 2 puffs into the lungs as needed.       . metoprolol succinate (TOPROL-XL) 25 MG 24 hr tablet Take 12.5 mg by mouth daily.        . Multiple Vitamins-Minerals (MULTIVITAMIN,TX-MINERALS) tablet Take 1 tablet by mouth daily.        . nitroGLYCERIN (NITROSTAT) 0.4 MG SL tablet Place 0.4 mg under the tongue every 5 (five) minutes as needed.        . sucralfate (CARAFATE) 1 G tablet Take 1 g by mouth daily.        objective; BP 106/66  Pulse 70  Temp(Src) 97.7 F (36.5 C) (Oral)  Ht 5\' 4"  (1.626 m)  Wt 108 lb (48.988 kg)   BMI 18.54 kg/m2 She does not appear to be in any distress. Conjunctiva is pink. Sclerae nonicteric.  Oropharyngeal mucosa is normal. No neck masses are noted. Abdomen is flat. Bowel sounds are normal. She has mild tenderness in midepigastric region and LLQ. No organomegaly or masses noted.  She does not have peripheral edema or clubbing. Lab data; CBC from 04/26/2011 WBC 2.9 hemoglobin 12.7 hematocrit 39.3; chemistry panel normal CBC from 05/10/2011 her WBC was back to normal at 5.6. Her vitamin D2 level was 13.7 which is better than the last time. Assessment; #1 nonhealing peptic ulcer disease of 3 years duration. She is currently on Nexium and Carafate as recommended by physicians from [facility]. Unless she has overt GI bleed or other complications we'll monitor her for now. Patient knows not to take any NSAIDs. #2. History of iron deficiency anemia and GI bleed. Recent H&H was normal. #3. Solitary rectal ulcer. On colonoscopy of June this year this ulcer had completely healed. He still has Canasa suppositories that she can use on when necessary basis #4 chronic low vitamin D level. High-dose therapy has not made any difference in her workup was negative for malabsorptive syndrome. Plan; I told patient I will talk with Dr. Catheryn Bacon of [facility] if he has any other  recommendations. In the meantime she will continue Nexium and Carafate; she should take Carafate 1 g twice daily. New prescription for alprazolam was also filled. She'll have CBC and serum ferritin in 2 months. Office visit in 3 months.

## 2011-06-17 ENCOUNTER — Ambulatory Visit: Payer: BC Managed Care – PPO | Admitting: Cardiology

## 2011-06-21 ENCOUNTER — Ambulatory Visit (INDEPENDENT_AMBULATORY_CARE_PROVIDER_SITE_OTHER): Payer: BC Managed Care – PPO | Admitting: Cardiology

## 2011-06-21 ENCOUNTER — Encounter: Payer: Self-pay | Admitting: Cardiology

## 2011-06-21 VITALS — BP 108/71 | HR 76 | Resp 16 | Ht 64.0 in | Wt 103.0 lb

## 2011-06-21 DIAGNOSIS — K589 Irritable bowel syndrome without diarrhea: Secondary | ICD-10-CM

## 2011-06-21 DIAGNOSIS — R55 Syncope and collapse: Secondary | ICD-10-CM

## 2011-06-21 DIAGNOSIS — R0789 Other chest pain: Secondary | ICD-10-CM

## 2011-06-21 DIAGNOSIS — R002 Palpitations: Secondary | ICD-10-CM

## 2011-06-21 MED ORDER — METOPROLOL SUCCINATE ER 25 MG PO TB24: 25.0000 mg | ORAL_TABLET | Freq: Every day | ORAL | Status: DC

## 2011-06-21 MED ORDER — NITROGLYCERIN 0.4 MG SL SUBL: 0.4000 mg | SUBLINGUAL_TABLET | SUBLINGUAL | Status: DC | PRN

## 2011-06-21 NOTE — Assessment & Plan Note (Signed)
No recurrent syncope. Stable. Continue medical therapy and no further cardiac workup needed

## 2011-06-21 NOTE — Patient Instructions (Addendum)
   Increase Metoprolol to 25mg  daily  Nitroglycerin refill given   No decaf coffee Your physician wants you to follow up in:  1 year.  You will receive a reminder letter in the mail one-two months in advance.  If you don't receive a letter, please call our office to schedule the follow up appointment

## 2011-06-21 NOTE — Progress Notes (Signed)
History of present illness: The patient is a 55 year old female with no prior history of documented CAD. She had a negative Lexi scan in 2010. She has significant intestinal problems including nonhealing ulcers and severe irritable bowel syndrome. She has irritable bowel symptoms with both diarrhea and constipation. She also significant abdominal pain throughout most of the day associated with her IBS. The patient has been seen in [facility] and recommendation was given to start amitriptyline/TCA for chronic pain control but the patient has not started the prescription yet. Unfortunately she continues to drink decaffeinated coffee also check cultures not particularly wise. She has frequent symptoms of abdominal pain and bloating. From a cardiac standpoint she is stable. She denies any chest pain. Shows occasional palpitations. She has no exertional dyspnea or chest pain. Electrocardio was reviewed and was essentially within normal limits short of a single PVC   Allergies, family history and social history: As documented in chart and reviewed  Medications: Documented and reviewed in chart.  Past medical history: Reviewed and see problem list below   Review of systems: No nausea vomiting. No fever or chills. No melena hematochezia. No palpitations or syncope. No dysuria or frequency   Physical examination : Vital signs documented below General: Underweight white female in no distress HEENT: Normal carotid upstroke no carotid bruits. No thyromegaly nonnodular thyroid. EOMI, PERRLA Lungs: Clear breath sounds bilaterally Heart: Regular rate and rhythm normal S1-S2 no murmur rubs or gallops Abdomen: Soft and nontender no rebound or guarding good bowel sounds Extremity exam: No cyanosis clubbing or edema Neurologic: Alert and oriented grossly nonfocal. Psychiatric: Normal affect Vascular exam: Normal pulses peripherally

## 2011-06-21 NOTE — Assessment & Plan Note (Signed)
Recommended FODMAP diet.

## 2011-06-21 NOTE — Assessment & Plan Note (Signed)
Rare palpitations. Again continue medical therapy.

## 2011-06-21 NOTE — Assessment & Plan Note (Signed)
Atypical chest pain but no typical symptoms necessitating further workup.

## 2011-08-30 ENCOUNTER — Encounter (INDEPENDENT_AMBULATORY_CARE_PROVIDER_SITE_OTHER): Payer: Self-pay | Admitting: Internal Medicine

## 2011-08-30 ENCOUNTER — Ambulatory Visit (INDEPENDENT_AMBULATORY_CARE_PROVIDER_SITE_OTHER): Payer: BC Managed Care – PPO | Admitting: Internal Medicine

## 2011-08-30 VITALS — BP 102/68 | HR 70 | Temp 97.4°F | Resp 12 | Ht 64.0 in | Wt 104.0 lb

## 2011-08-30 DIAGNOSIS — K626 Ulcer of anus and rectum: Secondary | ICD-10-CM | POA: Insufficient documentation

## 2011-08-30 DIAGNOSIS — E559 Vitamin D deficiency, unspecified: Secondary | ICD-10-CM | POA: Insufficient documentation

## 2011-08-30 DIAGNOSIS — K219 Gastro-esophageal reflux disease without esophagitis: Secondary | ICD-10-CM | POA: Insufficient documentation

## 2011-08-30 DIAGNOSIS — K279 Peptic ulcer, site unspecified, unspecified as acute or chronic, without hemorrhage or perforation: Secondary | ICD-10-CM

## 2011-08-30 DIAGNOSIS — D509 Iron deficiency anemia, unspecified: Secondary | ICD-10-CM

## 2011-08-30 DIAGNOSIS — R109 Unspecified abdominal pain: Secondary | ICD-10-CM

## 2011-08-30 MED ORDER — DICYCLOMINE HCL 20 MG PO TABS: 20.0000 mg | ORAL_TABLET | Freq: Every day | ORAL | Status: DC

## 2011-08-30 NOTE — Patient Instructions (Addendum)
Take dicyclomine 30 minutes before breakfast daily by mouth. Physician will contact you with results of blood work.

## 2011-08-30 NOTE — Progress Notes (Signed)
Presenting complaint; Followup for abdominal pain and peptic ulcer disease. Subjective: Lindsay Lawrence is 55 year old Caucasian female who is here for scheduled visit. She was last seen in August 2012 and she had small bowel given capsule study as recommended by physicians from GI division of [facility]. She was sent over there for refractory peptic ulcer disease and had her upper and lower GI tract reevaluated and no definite reason found for nonhealing ulcers. She continues to have frequent if not daily epigastric pain and pain also in left lower quadrant of her abdomen. She is having 3-4 bowel movements per day. Stools are soft to loose but she denies melena. She had an episode or rectal bleeding several weeks ago. She has intermittent nausea but has not vomited recently. She had her esophagus dilated in April this year. She is having some difficulty with apples and other solids but not bad enough she needs another dilation. She has lost 4 pounds in the last 3 months. She walks 20 at work but does not do any scheduled exercise. Current Medications Current Outpatient Prescriptions  Medication Sig Dispense Refill  . ALPRAZolam (XANAX) 0.5 MG tablet Take 1 tablet (0.5 mg total) by mouth 3 (three) times daily as needed for sleep or anxiety.  60 tablet  2  . Cholecalciferol (VITAMIN D3) 5000 UNITS CAPS Take 5,000 Units by mouth daily.        Marland Kitchen esomeprazole (NEXIUM) 40 MG capsule Take 1 capsule (40 mg total) by mouth daily.  30 capsule  11  . fluticasone (VERAMYST) 27.5 MCG/SPRAY nasal spray Place 2 sprays into the nose daily.        . Inulin (FIBERCHOICE) 2 G CHEW Chew 4 tablets by mouth daily.        Marland Kitchen ipratropium (ATROVENT HFA) 17 MCG/ACT inhaler Inhale 2 puffs into the lungs as needed.       . mesalamine (CANASA) 1000 MG suppository Place 1,000 mg rectally as needed.        . metoprolol succinate (TOPROL-XL) 25 MG 24 hr tablet Take 1 tablet (25 mg total) by mouth daily.  30 tablet  6  . Multiple  Vitamins-Minerals (MULTIVITAMIN,TX-MINERALS) tablet Take 1 tablet by mouth daily.        . nitroGLYCERIN (NITROSTAT) 0.4 MG SL tablet Place 1 tablet (0.4 mg total) under the tongue every 5 (five) minutes as needed.  25 tablet  3  . sucralfate (CARAFATE) 1 G tablet Take 1 tablet (1 g total) by mouth 2 (two) times daily.  60 tablet  5    Objective: BP 102/68  Pulse 70  Temp(Src) 97.4 F (36.3 C) (Oral)  Resp 12  Ht 5\' 4"  (1.626 m)  Wt 104 lb (47.174 kg)  BMI 17.85 kg/m2 Patient does not appear to be in any distress. Conjunctiva is pink. Sclera is nonicteric Oropharyngeal mucosa is normal. No neck masses or thyromegaly noted. Cardiac exam with regular rhythm normal S1 and S2. No murmur or gallop noted. Lungs are clear to auscultation. Abdomen is is flat with normal bowel sounds. She has mild tenderness at epigastrium and left lower quadrant. No LE edema or clubbing noted.  Assessment: #1. Refractory gastric ulcers. She remains on PPI and Carafate. She does not take any NSAIDs. Her gastrin levels have been negative and so has H. pylori serology been. She's been treated empirically for H. pylori infection. She has history of vitamin D deficiency and I wonder if that has anything to do with nonhealing ulcers. #2. Vitamin D  deficiency. #3. Lower abdominal pain secondary to IBS. #4. History of iron deficiency anemia felt to be multifactorial; GI blood loss and poor iron absorption with chronic acid suppression. #5. History of solitary rectal ulcer.   Plan: She will go to the lab for CBC, serum ferritin and vitamin D 2 level. Dicyclomine 20 mg by mouth 30 minutes before breakfast daily. Patient advised not to take any more fiber supplement as she is already on 8 g per day should take it 4 g twice a day. Office visit in 4 months.

## 2011-09-30 ENCOUNTER — Telehealth (INDEPENDENT_AMBULATORY_CARE_PROVIDER_SITE_OTHER): Payer: Self-pay | Admitting: *Deleted

## 2011-09-30 NOTE — Telephone Encounter (Signed)
Patient called, I spoke with Lindsay Lawrence and gave him the vitamin d level, as I had to call [facility] and get the results.  Results were 12.2- 08-30-11 , in August 2012 result was 13.7, April 2012 results was 10.3. I told Lindsay Lawrence that I would leave for Dr. Karilyn Cota and if he had recommendations either he or myself would call them back.

## 2011-09-30 NOTE — Telephone Encounter (Signed)
Lindsay Lawrence said they haven't heard the phone when Lindsay Lawrence tried to call. Lindsay Lawrence is at home and would like for Lindsay Lawrence to try and call him back. Please leave a message if Lindsay Lawrence doesn't answer. Dr. Karilyn Cota did call her about the lab work but didn't say anything about the Vit. D level. She would like to know about the Vit D. The return phone number is [phone].

## 2011-12-14 ENCOUNTER — Telehealth (INDEPENDENT_AMBULATORY_CARE_PROVIDER_SITE_OTHER): Payer: BC Managed Care – PPO | Admitting: *Deleted

## 2011-12-14 DIAGNOSIS — K279 Peptic ulcer, site unspecified, unspecified as acute or chronic, without hemorrhage or perforation: Secondary | ICD-10-CM

## 2011-12-14 NOTE — Telephone Encounter (Signed)
Labs due prior to office visit 

## 2011-12-27 ENCOUNTER — Encounter (INDEPENDENT_AMBULATORY_CARE_PROVIDER_SITE_OTHER): Payer: Self-pay | Admitting: Internal Medicine

## 2011-12-27 ENCOUNTER — Ambulatory Visit (INDEPENDENT_AMBULATORY_CARE_PROVIDER_SITE_OTHER): Payer: BC Managed Care – PPO | Admitting: Internal Medicine

## 2011-12-27 DIAGNOSIS — F411 Generalized anxiety disorder: Secondary | ICD-10-CM

## 2011-12-27 DIAGNOSIS — K626 Ulcer of anus and rectum: Secondary | ICD-10-CM

## 2011-12-27 DIAGNOSIS — R109 Unspecified abdominal pain: Secondary | ICD-10-CM

## 2011-12-27 DIAGNOSIS — F419 Anxiety disorder, unspecified: Secondary | ICD-10-CM

## 2011-12-27 MED ORDER — MESALAMINE 1000 MG RE SUPP: 1000.0000 mg | RECTAL | Status: DC | PRN

## 2011-12-27 MED ORDER — DICYCLOMINE HCL 20 MG PO TABS: 20.0000 mg | ORAL_TABLET | Freq: Two times a day (BID) | ORAL | Status: DC

## 2011-12-27 MED ORDER — ALPRAZOLAM 0.5 MG PO TABS: 0.5000 mg | ORAL_TABLET | Freq: Three times a day (TID) | ORAL | Status: DC | PRN

## 2011-12-27 NOTE — Progress Notes (Signed)
Presenting complaint; The patient complains of dysphagia, upper and lower abdominal pain, hematochezia, and rectal bleeding  Subjective: Lindsay Lawrence is a 56 year old Caucasian female, patient of Dr. Sherril Croon, who is here for scheduled visit.  She was last seen on August 30, 2011.  She states she is not feeling well.  She has had increasing abdominal pain over the last couple of weeks.  She complains of epigastric pain as well as pain in her left lower quadrant hypogastric area.  She is passing long stringy stools with mucus and she also noted small volume bright red blood per rectum.  She has not used mesalamine suppositories for a while.  She also complains of solid food dysphagia, but she is able to wash the food down with liquids.  She has not had any episode of food impaction with regurgitation.   The patient's last EGD was in April 2012.   She had EGD and a colonoscopy on March 08, 2011 at [facility].   She denies recent use of antibiotics.  Her appetite is fair.  She has gained 5 pounds in the last 4 months.  Current Medications: Current Outpatient Prescriptions  Medication Sig Dispense Refill  . ALPRAZolam (XANAX) 0.5 MG tablet Take 1 tablet (0.5 mg total) by mouth 3 (three) times daily as needed for sleep or anxiety.  60 tablet  2  . Cholecalciferol (VITAMIN D3) 5000 UNITS CAPS Take 5,000 Units by mouth daily.        Marland Kitchen dicyclomine (BENTYL) 20 MG tablet Take 1 tablet (20 mg total) by mouth daily.  30 tablet  5  . esomeprazole (NEXIUM) 40 MG capsule Take 1 capsule (40 mg total) by mouth daily.  30 capsule  11  . fluticasone (VERAMYST) 27.5 MCG/SPRAY nasal spray Place 2 sprays into the nose daily.        . Inulin (FIBERCHOICE) 2 G CHEW Chew 4 tablets by mouth daily.        Marland Kitchen ipratropium (ATROVENT HFA) 17 MCG/ACT inhaler Inhale 2 puffs into the lungs as needed.       . mesalamine (CANASA) 1000 MG suppository Place 1,000 mg rectally as needed.        . metoprolol succinate (TOPROL-XL) 25 MG 24  hr tablet Take 1 tablet (25 mg total) by mouth daily.  30 tablet  6  . Multiple Vitamins-Minerals (MULTIVITAMIN,TX-MINERALS) tablet Take 1 tablet by mouth daily.        . nitroGLYCERIN (NITROSTAT) 0.4 MG SL tablet Place 1 tablet (0.4 mg total) under the tongue every 5 (five) minutes as needed.  25 tablet  3  . sucralfate (CARAFATE) 1 G tablet Take 1 tablet (1 g total) by mouth 2 (two) times daily.  60 tablet  5    Objective: Blood pressure 110/68, pulse 70, temperature 97.8 F (36.6 C), temperature source Oral, resp. rate 16, height 5\' 4"  (1.626 m), weight 109 lb (49.442 kg).  GENERAL:  The patient is alert and does not appear to be in any distress.  HEENT:  Conjunctivae pink.  Sclerae nonicteric.  Oropharyngeal mucosa is normal.  NECK:  No neck masses or thyromegaly noted.  CARDIAC:  Regular rhythm.  Normal S1 and S2.  No murmur or gallop noted.  LUNGS:  Clear to auscultation.  ABDOMEN:  Flat.  Bowel sounds are normal.  On palpation, soft abdomen with mild-to-moderate midepigastric tenderness as well as mild tenderness at hypogastric area and LLQ.  No organomegaly or masses.  No peripheral edema, clubbing noted  Labs/studies Results: From December 17, 2011, WBC 4.3, H and H is 12 and 37.6, platelet count is 194 K.   Serum ferritin 7.3 ng/mL, was 11.1 on August 30, 2011.   Vitamin D2 level 11 ng/mL, was 12.2 on August 30, 2011.  Assessment: Lakrisha has multiple GI issues and she has not responded well to therapy. 1. Peptic ulcer disease.  She has refractory gastric ulcers.  She has not responded to double dose PPI.  She has well-documented ulcers locally as well as [facility].  Her gastrin levels have been normal.  She has been empirically treated for H. pylori gastritis.  She does not take any NSAIDs.  We will continue with medical therapy unless there is dramatic change in her symptoms. 2. Dysphagia.  She has chronic gastroesophageal reflux disease and history of Schatzki ring.  We  will consider repeat dilation if symptoms get worse.  Last dilation was one year ago. 3. Lower abdominal pain appears to be due to irritable bowel syndrome.  Once again, she has had multiple studies including colonoscopy less than a year ago at [facility]. 4. History of rectal ulcer, felt to be source of her rectal bleed and mucus. 5. Chronic vitamin D deficiency.  Once again, she has not responded to mega dose of vitamin D.  Workup for malabsorption has been negative.  Her trough level was 8.7 last year. 6.     Iron deficiency.  She has had chronically low serum               ferritin levels.  Her H and H is normal.  If her                hemoglobin drops, we will consider parenteral iron  Plan: 1. Increase dicyclomine to 20 mg p.o. b.i.d. 2. Prescription for alprazolam 0.3 mg t.i.d. p.r.n. 90 with 2 refills given.  Mesalamine suppository 1 g per rectum, p.r. at bedtime for 4 weeks and thereafter p.r.n., and prescription given for 30 with refills. 3. If symptoms get worse, she will give [place] a call.  Otherwise, return for office visit in July and we will consider lab studies prior to that visit.

## 2011-12-27 NOTE — Patient Instructions (Signed)
Increase dicyclomine to 20 mg by mouth daily before breakfast and evening meal. Use mesalamine suppository one per rectum daily at bedtime for 4 weeks and thereafter on as-needed basis.

## 2011-12-28 ENCOUNTER — Telehealth (INDEPENDENT_AMBULATORY_CARE_PROVIDER_SITE_OTHER): Payer: Self-pay | Admitting: *Deleted

## 2011-12-28 DIAGNOSIS — R1030 Lower abdominal pain, unspecified: Secondary | ICD-10-CM

## 2011-12-28 DIAGNOSIS — K625 Hemorrhage of anus and rectum: Secondary | ICD-10-CM

## 2011-12-28 DIAGNOSIS — K279 Peptic ulcer, site unspecified, unspecified as acute or chronic, without hemorrhage or perforation: Secondary | ICD-10-CM

## 2011-12-28 DIAGNOSIS — R1013 Epigastric pain: Secondary | ICD-10-CM

## 2011-12-28 NOTE — Telephone Encounter (Signed)
Per Dr.Rehman at the patient's office visit she will need the following labs in 3 monhts, they have been noted and the patient will be sent a letter as a reminder in July.

## 2011-12-31 ENCOUNTER — Encounter (INDEPENDENT_AMBULATORY_CARE_PROVIDER_SITE_OTHER): Payer: Self-pay

## 2012-01-27 ENCOUNTER — Telehealth (INDEPENDENT_AMBULATORY_CARE_PROVIDER_SITE_OTHER): Payer: Self-pay | Admitting: *Deleted

## 2012-01-27 NOTE — Telephone Encounter (Signed)
Per Dr. Karilyn Cota the patient will need to have a Abdominal /Pelvic CT with Contrast Patient called and made aware. Forwarded to [place] to arrange.

## 2012-01-27 NOTE — Telephone Encounter (Signed)
Lindsay Lawrence called the office this morning and states that for about 1 month she has had upper abdominal pain where her ulcer was,lower abdominal pain, the lower has been like a constant pain. Stomach appears swollen,looks like she is 5-6 months pregnant at times. Denies constipation, BM's are normal ,stools look greenish in color and she has experienced nausea. She has increased her Bentyl to twice a day. Patient told that we would contact Dr.Rehman her next appointment is July 15 th @ 9:30 am. She ask that we call her back on her cell [phone] at work till 6 pm tonight , lunch at 1

## 2012-01-28 ENCOUNTER — Telehealth (INDEPENDENT_AMBULATORY_CARE_PROVIDER_SITE_OTHER): Payer: Self-pay | Admitting: *Deleted

## 2012-01-28 ENCOUNTER — Other Ambulatory Visit (INDEPENDENT_AMBULATORY_CARE_PROVIDER_SITE_OTHER): Payer: Self-pay | Admitting: Internal Medicine

## 2012-01-28 DIAGNOSIS — R110 Nausea: Secondary | ICD-10-CM

## 2012-01-28 DIAGNOSIS — K279 Peptic ulcer, site unspecified, unspecified as acute or chronic, without hemorrhage or perforation: Secondary | ICD-10-CM

## 2012-01-28 DIAGNOSIS — Z0189 Encounter for other specified special examinations: Secondary | ICD-10-CM

## 2012-01-28 NOTE — Telephone Encounter (Signed)
Patient is to have CT of Abd/Pelvic with contrast,Monday 01/31/12

## 2012-01-28 NOTE — Telephone Encounter (Signed)
CT has been arranged for May 20 th the patient is to be there at 12:45 pm She will go to [facility] today and get contrast and instructions then go to [facility] and get lab work.

## 2012-01-28 NOTE — Telephone Encounter (Signed)
Patient has been set up for Monday 20th of May, arrive at 12:45 Pm. Lab will be drawn today. Patient will also be picking up her Contrast and instructions today.

## 2012-01-28 NOTE — Telephone Encounter (Signed)
Refer to previous encounter

## 2012-01-28 NOTE — Telephone Encounter (Addendum)
Dr. Karilyn Cota needed [facility] scheduled for a Abd Pelvic CT with contrast. (Please see other telephone encounters) She has been scheduled for 01/31/12 at 1:00 and will need to arrive at 12:45.  The Dx: Abd Pain, Nausea & Peptic Ulcer Diease. Patient has been notified and voices understood.  Authorization number is [id number] - good for 60 days until 03/27/12 [name], will you please put the order in?

## 2012-01-29 NOTE — Telephone Encounter (Signed)
CT ordered to evaluate changing symptoms

## 2012-01-31 ENCOUNTER — Encounter (INDEPENDENT_AMBULATORY_CARE_PROVIDER_SITE_OTHER): Payer: Self-pay | Admitting: *Deleted

## 2012-01-31 ENCOUNTER — Ambulatory Visit (HOSPITAL_COMMUNITY)
Admission: RE | Admit: 2012-01-31 | Discharge: 2012-01-31 | Disposition: A | Discharge status: Home / Self Care | Payer: BC Managed Care – PPO | Source: Ambulatory Visit | Attending: Internal Medicine | Admitting: Internal Medicine

## 2012-01-31 DIAGNOSIS — K279 Peptic ulcer, site unspecified, unspecified as acute or chronic, without hemorrhage or perforation: Secondary | ICD-10-CM

## 2012-01-31 DIAGNOSIS — R109 Unspecified abdominal pain: Secondary | ICD-10-CM | POA: Insufficient documentation

## 2012-01-31 DIAGNOSIS — R112 Nausea with vomiting, unspecified: Secondary | ICD-10-CM

## 2012-01-31 DIAGNOSIS — R110 Nausea: Secondary | ICD-10-CM

## 2012-01-31 DIAGNOSIS — R197 Diarrhea, unspecified: Secondary | ICD-10-CM | POA: Insufficient documentation

## 2012-01-31 DIAGNOSIS — R933 Abnormal findings on diagnostic imaging of other parts of digestive tract: Secondary | ICD-10-CM | POA: Insufficient documentation

## 2012-01-31 MED ORDER — IOHEXOL 300 MG/ML  SOLN
100.0000 mL | Freq: Once | INTRAMUSCULAR | Status: AC | PRN
  Administered 2012-01-31: 100 mL via INTRAVENOUS

## 2012-01-31 NOTE — Progress Notes (Signed)
This encounter was created in error - please disregard.

## 2012-02-02 ENCOUNTER — Other Ambulatory Visit: Payer: Self-pay | Admitting: Cardiology

## 2012-02-03 ENCOUNTER — Other Ambulatory Visit (INDEPENDENT_AMBULATORY_CARE_PROVIDER_SITE_OTHER): Payer: Self-pay | Admitting: *Deleted

## 2012-02-03 DIAGNOSIS — R935 Abnormal findings on diagnostic imaging of other abdominal regions, including retroperitoneum: Secondary | ICD-10-CM

## 2012-02-03 MED ORDER — SODIUM CHLORIDE 0.45 % IV SOLN
Freq: Once | INTRAVENOUS | Status: AC
  Administered 2012-02-04: 15:00:00 via INTRAVENOUS

## 2012-02-04 ENCOUNTER — Ambulatory Visit (HOSPITAL_COMMUNITY)
Admission: RE | Admit: 2012-02-04 | Discharge: 2012-02-04 | Disposition: A | Discharge status: Home / Self Care | Payer: BC Managed Care – PPO | Source: Ambulatory Visit | Attending: Internal Medicine | Admitting: Internal Medicine

## 2012-02-04 ENCOUNTER — Encounter (HOSPITAL_COMMUNITY): Payer: Self-pay | Admitting: *Deleted

## 2012-02-04 ENCOUNTER — Encounter (HOSPITAL_COMMUNITY)
Admission: RE | Disposition: A | Discharge status: Home / Self Care | Payer: Self-pay | Source: Ambulatory Visit | Attending: Internal Medicine

## 2012-02-04 DIAGNOSIS — K257 Chronic gastric ulcer without hemorrhage or perforation: Secondary | ICD-10-CM | POA: Insufficient documentation

## 2012-02-04 DIAGNOSIS — K259 Gastric ulcer, unspecified as acute or chronic, without hemorrhage or perforation: Secondary | ICD-10-CM

## 2012-02-04 DIAGNOSIS — K279 Peptic ulcer, site unspecified, unspecified as acute or chronic, without hemorrhage or perforation: Secondary | ICD-10-CM

## 2012-02-04 DIAGNOSIS — R935 Abnormal findings on diagnostic imaging of other abdominal regions, including retroperitoneum: Secondary | ICD-10-CM

## 2012-02-04 DIAGNOSIS — R112 Nausea with vomiting, unspecified: Secondary | ICD-10-CM

## 2012-02-04 DIAGNOSIS — R1013 Epigastric pain: Secondary | ICD-10-CM

## 2012-02-04 DIAGNOSIS — K2100 Gastro-esophageal reflux disease with esophagitis, without bleeding: Secondary | ICD-10-CM | POA: Insufficient documentation

## 2012-02-04 DIAGNOSIS — R933 Abnormal findings on diagnostic imaging of other parts of digestive tract: Secondary | ICD-10-CM

## 2012-02-04 HISTORY — PX: ESOPHAGOGASTRODUODENOSCOPY: SHX5428

## 2012-02-04 SURGERY — EGD (ESOPHAGOGASTRODUODENOSCOPY)
Anesthesia: Moderate Sedation

## 2012-02-04 MED ORDER — ESOMEPRAZOLE MAGNESIUM 40 MG PO CPDR: 40.0000 mg | DELAYED_RELEASE_CAPSULE | Freq: Two times a day (BID) | ORAL | Status: DC

## 2012-02-04 MED ORDER — MEPERIDINE HCL 25 MG/ML IJ SOLN
INTRAMUSCULAR | Status: DC | PRN
  Administered 2012-02-04: 25 mg via INTRAVENOUS

## 2012-02-04 MED ORDER — BUTAMBEN-TETRACAINE-BENZOCAINE 2-2-14 % EX AERO
INHALATION_SPRAY | CUTANEOUS | Status: DC | PRN
  Administered 2012-02-04: 2 via TOPICAL

## 2012-02-04 MED ORDER — STERILE WATER FOR IRRIGATION IR SOLN
Status: DC | PRN
  Administered 2012-02-04: 15:00:00

## 2012-02-04 MED ORDER — MIDAZOLAM HCL 5 MG/5ML IJ SOLN
INTRAMUSCULAR | Status: DC | PRN
  Administered 2012-02-04 (×2): 2 mg via INTRAVENOUS

## 2012-02-04 MED ORDER — MIDAZOLAM HCL 5 MG/5ML IJ SOLN
INTRAMUSCULAR | Status: AC
  Filled 2012-02-04: qty 10

## 2012-02-04 MED ORDER — MEPERIDINE HCL 50 MG/ML IJ SOLN
INTRAMUSCULAR | Status: AC
  Filled 2012-02-04: qty 1

## 2012-02-04 NOTE — H&P (Signed)
Lindsay Lawrence is an 56 y.o. female.   Chief Complaint: Patient is here for diagnostic EGD. HPI: Patient is 56 year old Caucasian female with known history of refractory/nonhealing gastroduodenal ulcers who has chronic symptoms of epigastric pain and intermittent nausea and vomiting. Patient called last week stating that the pain was worse and she is having more nausea with emesis. She did not experience hematemesis or melena. She had abdominopelvic CT earlier this week which revealed marked thickening and antrum as well as dilated second and third part of the duodenum suspicious for SMA syndrome. Patient has been maintained on PPI. She denies using NSAIDs. She hasn't had any weight loss recently. Patient's last EGD and colonoscopy was in June 2012 at [facility] on by given capsule study that she had at this facility. Patient's PUD was diagnosed over 4 years ago. She's been empirically treated for H. pylori gastritis level have been normal. Biopsies from the gastric ulcers been negative for Crohn's disease but other etiologies.  Past Medical History  Diagnosis Date  . Irritable bowel syndrome   . Gastric ulcer   . GERD (gastroesophageal reflux disease)   . Heart palpitations     Past Surgical History  Procedure Date  . Givens capsule study 05/03/2011    Procedure: GIVENS CAPSULE STUDY;  Surgeon: Malissa Hippo, MD;  Location: [facility];  Service: Endoscopy;  Laterality: N/A;  7:30 am  . Colonoscopy   . Upper gastrointestinal endoscopy   . Cholecystectomy   . Abdominal hysterectomy     Family History  Problem Relation Age of Onset  . Healthy Mother   . Heart attack Father   . Healthy Sister    Social History:  reports that she has never smoked. She has never used smokeless tobacco. She reports that she does not drink alcohol or use illicit drugs.  Allergies:  Allergies  Allergen Reactions  . Aspirin     REACTION: ulcer, reflux  . Codeine     REACTION: Speeds heart up,  n/v, dizziness  . Fentanyl     REACTION: decreased LOC  . Nsaids     REACTION: reflux, ulcer  . Propoxyphene-Acetaminophen     REACTION: Speeds heart up, n/v, dizziness    Medications Prior to Admission  Medication Sig Dispense Refill  . ALPRAZolam (XANAX) 0.5 MG tablet Take 1 tablet (0.5 mg total) by mouth 3 (three) times daily as needed for sleep or anxiety.  90 tablet  2  . Cholecalciferol (VITAMIN D3) 5000 UNITS CAPS Take 5,000 Units by mouth daily.        Marland Kitchen dicyclomine (BENTYL) 20 MG tablet Take 1 tablet (20 mg total) by mouth 2 (two) times daily.  60 tablet  5  . esomeprazole (NEXIUM) 40 MG capsule Take 1 capsule (40 mg total) by mouth daily.  30 capsule  11  . Inulin (FIBERCHOICE) 2 G CHEW Chew 4 tablets by mouth daily.        . mesalamine (CANASA) 1000 MG suppository Place 1 suppository (1,000 mg total) rectally as needed.  30 suppository  2  . metoprolol succinate (TOPROL-XL) 25 MG 24 hr tablet TAKE ONE TABLET BY MOUTH EVERY DAY  30 tablet  6  . Multiple Vitamins-Minerals (MULTIVITAMIN,TX-MINERALS) tablet Take 1 tablet by mouth daily.        . sucralfate (CARAFATE) 1 G tablet Take 1 tablet (1 g total) by mouth 2 (two) times daily.  60 tablet  5  . fluticasone (VERAMYST) 27.5 MCG/SPRAY nasal spray  Place 2 sprays into the nose daily.        Marland Kitchen ipratropium (ATROVENT HFA) 17 MCG/ACT inhaler Inhale 2 puffs into the lungs as needed.       . nitroGLYCERIN (NITROSTAT) 0.4 MG SL tablet Place 1 tablet (0.4 mg total) under the tongue every 5 (five) minutes as needed.  25 tablet  3    No results found for this or any previous visit (from the past 48 hour(s)). No results found.  ROS  Blood pressure 119/80, pulse 66, temperature 97.8 F (36.6 C), temperature source Oral, resp. rate 22, height 5\' 4"  (1.626 m), weight 107 lb (48.535 kg), SpO2 100.00%. Physical Exam  Constitutional:       Pleasant well developed thin Caucasian female in NAD  HENT:  Mouth/Throat: Oropharynx is clear and  moist.  Eyes: Conjunctivae are normal. No scleral icterus.  Neck: No thyromegaly present.  Cardiovascular: Normal rate, regular rhythm and normal heart sounds.   No murmur heard. Respiratory: Effort normal and breath sounds normal.  GI: Soft. She exhibits no mass. There is tenderness (Mild to moderate midepigastric tenderness).  Musculoskeletal: She exhibits no edema.  Lymphadenopathy:    She has no cervical adenopathy.  Neurological: She is alert.  Skin: Skin is warm and dry.     Assessment/Plan Worsening epigastric pain with nausea and vomiting. Abnormal CT with marked thickening to antrum and duodenal dilation. Diagnostic EGD  Kaiser Belluomini U 02/04/2012, 2:56 PM

## 2012-02-04 NOTE — Op Note (Signed)
EGD PROCEDURE REPORT  PATIENT:  Lindsay Lawrence  MR#:  [id number] Birthdate:  02-15-1956, 56 y.o., female Endoscopist:  Dr. Malissa Hippo, MD Referred By:  Dr. Kirstie Peri, MD Procedure Date: 02/04/2012  Procedure:   EGD  Indications:  Patient is 56 year old Caucasian female with nonhealing peptic ulcer disease with been having worsening pain with nausea vomiting. She had abdominopelvic CT area this week which reveals marked thickening to antrum and dilated duodenum. She is undergoing diagnostic EGD.            Informed Consent:  The risks, benefits, alternatives & imponderables which include, but are not limited to, bleeding, infection, perforation, drug reaction and potential missed lesion have been reviewed.  The potential for biopsy, lesion removal, esophageal dilation, etc. have also been discussed.  Questions have been answered.  All parties agreeable.  Please see history & physical in medical record for more information.  Medications:  Demerol 25 mg IV Versed 4 mg IV Cetacaine spray topically for oropharyngeal anesthesia  Description of procedure:  The endoscope was introduced through the mouth and advanced to the second portion of the duodenum without difficulty or limitations. The mucosal surfaces were surveyed very carefully during advancement of the scope and upon withdrawal.  Findings:  Esophagus:  Mucosa of the esophagus was normal. Focal edema and erythema noted at GE junction but  no stricture or ring noted. GEJ:  39 cm Stomach:  Stomach was empty and distended very well with insufflation. Folds in the proximal stomach are normal. Examination mucosa revealed few linear erosions at gastric body along with 4 small ulcers at antrum these range in size from 3-6 mm in all had clean base. In addition multiple scars were noted. Angularis fundus and cardia were examined by retroflexing the scope and were normal. Two pyloric channel ulcers were noted one along the anterior wall was 7-8  mm the other one was slightly smaller. Pyloric channel was patent. Duodenum:  Normal mucosa of the bulb second and third part of the duodenum. No fluid or debris noted.  Therapeutic/Diagnostic Maneuvers Performed:  Multiple biopsies are taken from gastric ulcers for routine histology.  Complications:  None  Impression: Mild changes of reflux esophagitis limited to GE junction. Four small gastric ulcers at antrum along with multiple scars and few erosions at gastric body. Patent pyloric channel with 2 ulcers. Normal mucosa of first second and third part of duodenum.  Recommendations:  Increase Nexium to 40 mg by mouth twice a day. Once again patient advised not to take any NSAIDs whatsoever. I will contact patient with results of biopsy and further recommendations.  Haidynn Almendarez U  02/04/2012  3:29 PM  CC: Dr. Ignatius Specking., MD, MD & Dr. Bonnetta Barry ref. provider found

## 2012-02-04 NOTE — Discharge Instructions (Signed)
Increase Nexium to 40 mg by mouth twice a day. Resume other medications as before. No driving for 24 hours. Physician will contact you with biopsy results. Remember you cannot take any arthritis medications including aspirin, Aleve, ibuprofen, Advil, Anaprox etc.

## 2012-02-09 ENCOUNTER — Encounter (HOSPITAL_COMMUNITY): Payer: Self-pay | Admitting: Internal Medicine

## 2012-02-11 ENCOUNTER — Encounter (INDEPENDENT_AMBULATORY_CARE_PROVIDER_SITE_OTHER): Payer: Self-pay | Admitting: *Deleted

## 2012-02-14 ENCOUNTER — Telehealth (INDEPENDENT_AMBULATORY_CARE_PROVIDER_SITE_OTHER): Payer: Self-pay | Admitting: *Deleted

## 2012-02-14 NOTE — Telephone Encounter (Signed)
Patient called and is requesting something for pain. She uses [facility]. Per Dr. Karilyn Cota may call in Vicodin 5/500 mg - Take one (1) by mouth Twice a day as needed for pain -#30 no refills This was called to [facility]. Patient called and made aware

## 2012-03-09 ENCOUNTER — Other Ambulatory Visit: Payer: Self-pay

## 2012-03-09 ENCOUNTER — Encounter (INDEPENDENT_AMBULATORY_CARE_PROVIDER_SITE_OTHER): Payer: Self-pay | Admitting: *Deleted

## 2012-03-09 ENCOUNTER — Ambulatory Visit (HOSPITAL_COMMUNITY)
Admission: RE | Admit: 2012-03-09 | Discharge: 2012-03-09 | Disposition: A | Discharge status: Home / Self Care | Payer: BC Managed Care – PPO | Source: Ambulatory Visit | Attending: Internal Medicine | Admitting: Internal Medicine

## 2012-03-09 ENCOUNTER — Other Ambulatory Visit (INDEPENDENT_AMBULATORY_CARE_PROVIDER_SITE_OTHER): Payer: Self-pay | Admitting: *Deleted

## 2012-03-09 DIAGNOSIS — K625 Hemorrhage of anus and rectum: Secondary | ICD-10-CM

## 2012-03-09 DIAGNOSIS — K279 Peptic ulcer, site unspecified, unspecified as acute or chronic, without hemorrhage or perforation: Secondary | ICD-10-CM

## 2012-03-09 DIAGNOSIS — R0602 Shortness of breath: Secondary | ICD-10-CM | POA: Insufficient documentation

## 2012-03-09 DIAGNOSIS — R1013 Epigastric pain: Secondary | ICD-10-CM

## 2012-03-09 DIAGNOSIS — R1030 Lower abdominal pain, unspecified: Secondary | ICD-10-CM

## 2012-03-09 MED ORDER — ALBUTEROL SULFATE (5 MG/ML) 0.5% IN NEBU
2.5000 mg | INHALATION_SOLUTION | Freq: Once | RESPIRATORY_TRACT | Status: AC
  Administered 2012-03-09: 2.5 mg via RESPIRATORY_TRACT

## 2012-03-09 NOTE — Procedures (Signed)
Lindsay Lawrence, Lindsay Lawrence                ACCOUNT NO.:  [id number]  MEDICAL RECORD NO.:  [id number]  LOCATION:  RESP                          FACILITY:  [facility]  PHYSICIAN:  Ayesha Markwell L. Juanetta Lawrence, M.D.DATE OF BIRTH:  [birth year]  DATE OF PROCEDURE: DATE OF DISCHARGE:                           PULMONARY FUNCTION TEST   Reason for pulmonary function testing is shortness of breath. 1. Spirometry shows a mild ventilatory defect with evidence of airflow     obstruction. 2. Lung volumes show normal total lung capacity. 3. DLCO is mildly reduced, but corrects somewhat when volume is     accounted for. 4. Airway resistance is low. 5. There is significant bronchodilator improvement. 6. Based on the shape of the flow volume, this could indicate primary     restrictory change, but clinical correlation is suggested.  This     could also represent asthma.     Lindsay Lawrence, M.D.     [facility]/MEDQ  D:  03/09/2012  T:  03/09/2012  Job:  [id number]  cc:   Catalina Pizza, M.D. Fax: [phone]

## 2012-03-15 LAB — PULMONARY FUNCTION TEST

## 2012-03-17 ENCOUNTER — Encounter (HOSPITAL_COMMUNITY): Payer: BC Managed Care – PPO

## 2012-03-27 ENCOUNTER — Encounter (INDEPENDENT_AMBULATORY_CARE_PROVIDER_SITE_OTHER): Payer: Self-pay | Admitting: Internal Medicine

## 2012-03-27 ENCOUNTER — Ambulatory Visit (INDEPENDENT_AMBULATORY_CARE_PROVIDER_SITE_OTHER): Payer: BC Managed Care – PPO | Admitting: Internal Medicine

## 2012-03-27 VITALS — BP 90/52 | HR 72 | Temp 97.3°F | Ht 64.0 in | Wt 111.7 lb

## 2012-03-27 DIAGNOSIS — F419 Anxiety disorder, unspecified: Secondary | ICD-10-CM

## 2012-03-27 DIAGNOSIS — R109 Unspecified abdominal pain: Secondary | ICD-10-CM

## 2012-03-27 DIAGNOSIS — K279 Peptic ulcer, site unspecified, unspecified as acute or chronic, without hemorrhage or perforation: Secondary | ICD-10-CM

## 2012-03-27 DIAGNOSIS — F411 Generalized anxiety disorder: Secondary | ICD-10-CM

## 2012-03-27 DIAGNOSIS — D72819 Decreased white blood cell count, unspecified: Secondary | ICD-10-CM

## 2012-03-27 MED ORDER — ALPRAZOLAM 0.5 MG PO TABS: 0.5000 mg | ORAL_TABLET | Freq: Three times a day (TID) | ORAL | Status: DC | PRN

## 2012-03-27 MED ORDER — HYDROCODONE-ACETAMINOPHEN 5-500 MG PO TABS: 1.0000 | ORAL_TABLET | ORAL | Status: DC | PRN

## 2012-03-27 MED ORDER — HYDROCODONE-ACETAMINOPHEN 5-500 MG PO TABS: 1.0000 | ORAL_TABLET | Freq: Three times a day (TID) | ORAL | Status: AC | PRN

## 2012-03-27 NOTE — Progress Notes (Signed)
Presenting complaint;  Epigastric pain.  Subjective:   Patient is 56 year old Caucasian female was at her scheduled visit. She was last seen 3 months ago. She complains of worsening epigastric pain occurring on daily basis associated with nausea and sporadic vomiting. She denies hematemesis or melena. She says the pain at times is severe and she feels like she would pass out. She was called in prescription for Vicodin but she decided not to fill it. She reassures me that she does not take any NSAIDs. Her last EGD was in May 2013 and she was found to have 4 gastric ulcers and 2 ulcers the pyloric channel. Biopsy of gastric ulcers revealed benign etiology and H. pylori stains were negative. She also complains of intermittent bloating. She also complains of intermittent LLQ abdominal pain. Her bowels move daily. She has occasional rectal discharge and or hematochezia. She is using mesalamine suppositories on when necessary basis. Her weight is up by two pounds since her last visit.  Current Medications: Current Outpatient Prescriptions  Medication Sig Dispense Refill  . ALPRAZolam (XANAX) 0.5 MG tablet Take 1 tablet (0.5 mg total) by mouth 3 (three) times daily as needed for sleep or anxiety.  90 tablet  2  . Cholecalciferol (VITAMIN D3) 5000 UNITS CAPS Take 5,000 Units by mouth daily.        Marland Kitchen esomeprazole (NEXIUM) 40 MG capsule Take 1 capsule (40 mg total) by mouth 2 (two) times daily before a meal.  60 capsule  5  . fluticasone (VERAMYST) 27.5 MCG/SPRAY nasal spray Place 2 sprays into the nose as needed.       . Inulin (FIBERCHOICE) 2 G CHEW Chew 4 tablets by mouth daily.        . metoprolol succinate (TOPROL-XL) 25 MG 24 hr tablet TAKE ONE TABLET BY MOUTH EVERY DAY  30 tablet  6  . mometasone (ASMANEX 120 METERED DOSES) 220 MCG/INH inhaler Inhale 2 puffs into the lungs daily.      . Multiple Vitamins-Minerals (MULTIVITAMIN,TX-MINERALS) tablet Take 1 tablet by mouth daily.        .  nitroGLYCERIN (NITROSTAT) 0.4 MG SL tablet Place 1 tablet (0.4 mg total) under the tongue every 5 (five) minutes as needed.  25 tablet  3  . predniSONE (DELTASONE) 10 MG tablet Take 10 mg by mouth daily.      . sucralfate (CARAFATE) 1 G tablet Take 1 tablet (1 g total) by mouth 2 (two) times daily.  60 tablet  5  . dicyclomine (BENTYL) 20 MG tablet Take 1 tablet (20 mg total) by mouth 2 (two) times daily.  60 tablet  5  . ipratropium (ATROVENT HFA) 17 MCG/ACT inhaler Inhale 2 puffs into the lungs as needed.       . mesalamine (CANASA) 1000 MG suppository Place 1 suppository (1,000 mg total) rectally as needed.  30 suppository  2     Objective: Blood pressure 90/52, pulse 72, temperature 97.3 F (36.3 C), height 5\' 4"  (1.626 m), weight 111 lb 11.2 oz (50.667 kg). Patient is alert and does not appear to be in acute distress. Conjunctiva is pink. Sclera is nonicteric Oropharyngeal mucosa is normal. No neck masses or thyromegaly noted. Cardiac exam with regular rhythm normal S1 and S2. No murmur or gallop noted. Lungs are clear to auscultation. Abdomen. Bowel sounds are normal. Abdomen is soft with mild to moderate midepigastric tenderness. No organomegaly or masses. No LE edema or clubbing noted.  Labs/studies Results: 03/20/2012 WBC 2.7, H&H 12.4 and  38.4, platelet count 174K. Auto differential within normal limits Serum ferritin is 12.3 ng/ml. Cholesterol 222  serum magnesium 2.2 Vitamin D2 14.0   Assessment:  #1. Worsening epigastric pain. Patient has nonhealing gastric ulcers. She has been extensively evaluated at to tertiary centers. Last EGD was in may 2013 revealing for gastric ulcers and to pyloric channel ulcers. Gastrin levels have been normal. H. pylori serology has been negative. She's been empirically treated for H. Pylori. She denies NSAID use. She has been on PPI for years both for peptic ulcer disease and GERD. #2. History of iron deficiency anemia. H&H is normal and  the ferritin is up slightly. #3. Vitamin D deficiency. Vitamin D level is up slightly. Once again she has been given all sorts of doses but nothing seemed to make the difference. #4. Solitary rectal ulcer. He has occasional hematochezia and uses mesalamine on as-needed basis. #5. Leukopenia. H&H and platelet count are normal. Last WBC was normal. Her WBC has been borderline in the past but never this low.   Plan:  Prescription given for hydrocodone/acetaminophen 5 500; 1 tablet 3 times a day when necessary. Please note patient did not fill her last prescription for 20 doses. Prescription for alprazolam renewed. CBC with differential and peripheral smear by pathologist. Will confer with Dr. Catheryn Bacon, gastroenterologist at [facility] who has seen patient.

## 2012-03-30 ENCOUNTER — Telehealth (INDEPENDENT_AMBULATORY_CARE_PROVIDER_SITE_OTHER): Payer: Self-pay | Admitting: *Deleted

## 2012-03-30 NOTE — Telephone Encounter (Signed)
Reba, please type up a simple letter stating that patient has irritable bowel syndrome with unpredictable diarrhea and urgency and needs to go to bathroom. Patient also has peptic ulcer disease refractory to therapy which can result in nausea and vomiting

## 2012-03-30 NOTE — Telephone Encounter (Signed)
Lindsay Lawrence would like to remind Dr. Karilyn Cota that she is needing a letter for work. The letter needs to explain why she has to stop working and/or go to the bathroom. It needs to be detailed. May include her ulers, pain and nausea.

## 2012-04-07 ENCOUNTER — Encounter (INDEPENDENT_AMBULATORY_CARE_PROVIDER_SITE_OTHER): Payer: Self-pay

## 2012-04-11 NOTE — Telephone Encounter (Signed)
Patient has brought a FMLA since [place] will not accept letters

## 2012-04-19 ENCOUNTER — Telehealth (INDEPENDENT_AMBULATORY_CARE_PROVIDER_SITE_OTHER): Payer: Self-pay | Admitting: *Deleted

## 2012-04-19 DIAGNOSIS — R42 Dizziness and giddiness: Secondary | ICD-10-CM

## 2012-04-19 DIAGNOSIS — M6281 Muscle weakness (generalized): Secondary | ICD-10-CM

## 2012-04-19 DIAGNOSIS — I959 Hypotension, unspecified: Secondary | ICD-10-CM

## 2012-04-19 NOTE — Telephone Encounter (Signed)
Lindsay Lawrence called and left a message that she was a patient at [facility]. She was admitted on 04/18/12 by Dr.Vyas. She says that her B/P dropped real low, vomiting, and IBS acting up. Ms. Schiller did not want [place] to think she was ignoring [place] if we had called her .  She may be reached at [phone] room 221

## 2012-04-19 NOTE — Telephone Encounter (Signed)
Dr. Catheryn Bacon commended gastric pH 24 hours while on PPI. We do not have been to do this test at [facility]. Will ask Dr. Pete Glatter office if they could schedule that test at [facility]. Ann, please call Dr. Pete Glatter office and see they could schedule this test.

## 2012-04-20 NOTE — Telephone Encounter (Signed)
Referral & notes faxed to [facility], they will contact patient with appt

## 2012-05-02 ENCOUNTER — Telehealth: Payer: Self-pay | Admitting: *Deleted

## 2012-05-02 NOTE — Telephone Encounter (Signed)
Message left on voice mail - was in hospital on 8/9, BP dropped real low & PMD took her off of the Metoprolol.  GD had on for heart palpitations & wants to know if safe.  Does she need any med in its place?  Attempted to return call - left message with husband to return call.

## 2012-05-03 NOTE — Telephone Encounter (Signed)
Message left on voice mail - giving permission to discuss with husband since she is at work during the day and can not accept calls.  Wanting to make sure it was okay for her to just stop this med like Vyas told her to.  Stated she had been on this for 2 years for palpitations & scared they may come back without this med.  Wanted OV.    OV was scheduled for 07-17-2012 with GD.  Per scheduling note, patient was offered another provider or Gene, but needed a Monday & preferred to wait for GD.

## 2012-05-04 NOTE — Telephone Encounter (Signed)
I can not get my own patients in. For now, have her see Gene. I need to see the hospital consult and D/C summary from recent visit.

## 2012-05-04 NOTE — Telephone Encounter (Signed)
Lindsay Lawrence called today stating that she was wanting to know if she can switch doctors. She said that Dr. Myrtis Ser saw her in the hospital On 04-19-2012 and that she really liked him. She feels that she needs to re-establish with another cardiologist. I will send this note to Dr.Katz for approval.

## 2012-05-05 NOTE — Telephone Encounter (Signed)
OV scheduled for 9/4 with Gene.  Patient aware.

## 2012-05-09 ENCOUNTER — Other Ambulatory Visit (INDEPENDENT_AMBULATORY_CARE_PROVIDER_SITE_OTHER): Payer: Self-pay | Admitting: *Deleted

## 2012-05-09 ENCOUNTER — Encounter (INDEPENDENT_AMBULATORY_CARE_PROVIDER_SITE_OTHER): Payer: Self-pay | Admitting: *Deleted

## 2012-05-09 DIAGNOSIS — R112 Nausea with vomiting, unspecified: Secondary | ICD-10-CM

## 2012-05-09 DIAGNOSIS — R1013 Epigastric pain: Secondary | ICD-10-CM

## 2012-05-16 ENCOUNTER — Encounter (INDEPENDENT_AMBULATORY_CARE_PROVIDER_SITE_OTHER): Payer: Self-pay | Admitting: *Deleted

## 2012-05-17 ENCOUNTER — Ambulatory Visit (INDEPENDENT_AMBULATORY_CARE_PROVIDER_SITE_OTHER): Payer: BC Managed Care – PPO | Admitting: Physician Assistant

## 2012-05-17 ENCOUNTER — Telehealth (INDEPENDENT_AMBULATORY_CARE_PROVIDER_SITE_OTHER): Payer: Self-pay | Admitting: *Deleted

## 2012-05-17 ENCOUNTER — Encounter: Payer: Self-pay | Admitting: Physician Assistant

## 2012-05-17 VITALS — BP 111/70 | HR 90 | Ht 64.0 in | Wt 110.4 lb

## 2012-05-17 DIAGNOSIS — I059 Rheumatic mitral valve disease, unspecified: Secondary | ICD-10-CM

## 2012-05-17 DIAGNOSIS — I340 Nonrheumatic mitral (valve) insufficiency: Secondary | ICD-10-CM

## 2012-05-17 DIAGNOSIS — R0789 Other chest pain: Secondary | ICD-10-CM

## 2012-05-17 MED ORDER — METOPROLOL TARTRATE 25 MG PO TABS: 12.5000 mg | ORAL_TABLET | Freq: Two times a day (BID) | ORAL | Status: DC

## 2012-05-17 NOTE — Assessment & Plan Note (Signed)
No further workup indicated. Recent hospitalization with normal troponins, and normal LVF by echocardiogram. Patient was also advised to not take NTG, in light of recent significant hypotension. She has no documented CAD.

## 2012-05-17 NOTE — Assessment & Plan Note (Signed)
We'll resume beta blocker, albeit at half the previous dose with 12.5 mg twice a day. We'll reassess clinical status in 2 weeks, with me.

## 2012-05-17 NOTE — Assessment & Plan Note (Signed)
Moderate, by recent echocardiogram

## 2012-05-17 NOTE — Patient Instructions (Addendum)
   Stop Nitroglycerin   Begin Lopressor 12.5mg  twice a day  Continue all other current medications. Follow up in  2 weeks

## 2012-05-17 NOTE — Telephone Encounter (Signed)
Lindsay Lawrence has called to ask if when she has the EGD done if Lindsay Lawrence will be able to do the pH study at the same time. Lindsay Lawrence has told her that Lindsay Lawrence could but she wanted to be sure. She currently is on for 06/02/12 to have the EGD. She mentions that she may have to have the ED with the procedure as she has gotten to where she cannot swallow. If there should be a cancellation prior to 06/02/12 , she would like to be moved up and a morning appointment instead of afternoon. Lindsay Lawrence says that she is feeling real bad,just sick all the time.

## 2012-05-17 NOTE — Progress Notes (Signed)
Primary Cardiologist: Lewayne Bunting, MD   HPI: Post hospital followup from [facility], seen in consultation by Dr Myrtis Ser for evaluation of weakness and hypotension, with systolics in the 80 range. Metoprolol was placed on hold. NL Troponins. 2D echo revealed NL LVF, mild MR.   Over the past month, patient has not had any recurrent near syncope/syncope or falls. However, she continues to experience occasional palpitations, several times a week. There has been no increase in frequency, intensity, or duration, however. Nevertheless, she remains quite aware of these, and had been on beta blocker for suppression of this, over the past several years.  She denies any exertional chest pain. She has never smoked tobacco.  Allergies  Allergen Reactions  . Aspirin     REACTION: ulcer, reflux  . Codeine     REACTION: Speeds heart up, n/v, dizziness  . Fentanyl     REACTION: decreased LOC  . Nsaids     REACTION: reflux, ulcer  . Propoxyphene-Acetaminophen     REACTION: Speeds heart up, n/v, dizziness    Current Outpatient Prescriptions  Medication Sig Dispense Refill  . albuterol (PROVENTIL HFA;VENTOLIN HFA) 108 (90 BASE) MCG/ACT inhaler Inhale 2 puffs into the lungs as needed.      . ALPRAZolam (XANAX) 0.5 MG tablet Take 1 tablet (0.5 mg total) by mouth 3 (three) times daily as needed for sleep or anxiety.  90 tablet  2  . Cholecalciferol (VITAMIN D3) 5000 UNITS CAPS Take 5,000 Units by mouth daily.        Marland Kitchen esomeprazole (NEXIUM) 40 MG capsule Take 1 capsule (40 mg total) by mouth 2 (two) times daily before a meal.  60 capsule  5  . Inulin (FIBERCHOICE) 2 G CHEW Chew 4 tablets by mouth daily.        . mesalamine (CANASA) 1000 MG suppository Place 1 suppository (1,000 mg total) rectally as needed.  30 suppository  2  . mometasone (ASMANEX 120 METERED DOSES) 220 MCG/INH inhaler Inhale 2 puffs into the lungs daily.      . Multiple Vitamins-Minerals (MULTIVITAMIN,TX-MINERALS) tablet Take 1 tablet by mouth daily.         . sucralfate (CARAFATE) 1 G tablet Take 1 tablet (1 g total) by mouth 2 (two) times daily.  60 tablet  5  . fluticasone (VERAMYST) 27.5 MCG/SPRAY nasal spray Place 2 sprays into the nose as needed.       . metoprolol tartrate (LOPRESSOR) 25 MG tablet Take 0.5 tablets (12.5 mg total) by mouth 2 (two) times daily.  30 tablet  6    Past Medical History  Diagnosis Date  . Irritable bowel syndrome   . Gastric ulcer   . GERD (gastroesophageal reflux disease)   . Heart palpitations     Past Surgical History  Procedure Date  . Givens capsule study 05/03/2011    Procedure: GIVENS CAPSULE STUDY;  Surgeon: Malissa Hippo, MD;  Location: [facility];  Service: Endoscopy;  Laterality: N/A;  7:30 am  . Colonoscopy   . Upper gastrointestinal endoscopy   . Cholecystectomy   . Abdominal hysterectomy   . Esophagogastroduodenoscopy 02/04/2012    Procedure: ESOPHAGOGASTRODUODENOSCOPY (EGD);  Surgeon: Malissa Hippo, MD;  Location: [facility];  Service: Endoscopy;  Laterality: N/A;  320    History   Social History  . Marital Status: Married    Spouse Name: N/A    Number of Children: N/A  . Years of Education: N/A   Occupational History  . Not  on file.   Social History Main Topics  . Smoking status: Never Smoker   . Smokeless tobacco: Never Used  . Alcohol Use: No  . Drug Use: No  . Sexually Active: Not on file   Other Topics Concern  . Not on file   Social History Narrative  . No narrative on file    Family History  Problem Relation Age of Onset  . Healthy Mother   . Heart attack Father   . Healthy Sister     ROS: no nausea, vomiting; no fever, chills; no melena, hematochezia; no claudication  PHYSICAL EXAM: BP 111/70  Pulse 90  Ht 5\' 4"  (1.626 m)  Wt 110 lb 6.4 oz (50.077 kg)  BMI 18.95 kg/m2  SpO2 100% GENERAL: 55 yof; NAD HEENT: NCAT, PERRLA, EOMI; sclera clear; no xanthelasma NECK: palpable bilateral carotid pulses, no bruits; no JVD; no TM LUNGS: CTA  bilaterally CARDIAC: RRR (S1, S2); no significant murmurs; no rubs or gallops ABDOMEN: soft, non-tender; intact BS EXTREMETIES: intact distal pulses; no significant peripheral edema SKIN: warm/dry; no obvious rash/lesions MUSCULOSKELETAL: no joint deformity NEURO: no focal deficit; NL affect   EKG:    ASSESSMENT & PLAN:  PALPITATIONS, RECURRENT We'll resume beta blocker, albeit at half the previous dose with 12.5 mg twice a day. We'll reassess clinical status in 2 weeks, with me.  CHEST PAIN, ATYPICAL No further workup indicated. Recent hospitalization with normal troponins, and normal LVF by echocardiogram. Patient was also advised to not take NTG, in light of recent significant hypotension. She has no documented CAD.  Mitral regurgitation Moderate, by recent echocardiogram    Gene Khalise Billard, PAC

## 2012-05-19 ENCOUNTER — Telehealth: Payer: Self-pay | Admitting: *Deleted

## 2012-05-19 ENCOUNTER — Encounter (HOSPITAL_COMMUNITY): Payer: Self-pay | Admitting: Pharmacy Technician

## 2012-05-19 NOTE — Telephone Encounter (Signed)
Patient called and I spoke with her husband Peyton Najjar. I told him that Dr.Rehman would be able to do the EGD and the pH study as he and Dr.Orlando had discussed. Also if she needs her Esophagus needs stretching he will take care of that also. If there are any cancellations in Endo ,we will see if patient can be moved up as her appointment is 06/02/12.

## 2012-05-19 NOTE — Telephone Encounter (Signed)
BP running low.  Felt lightheaded this morning.  Checked at work - 85/54  60.  Still taking 12.5mg  daily on the Metoprolol.    Suggested she keep a log over the weekend & call back on Monday with readings.  Advised her that if she felt syncopal that she could hold med if she felt necessary.  She verbalized understanding.

## 2012-05-21 NOTE — Telephone Encounter (Signed)
Please tell patient to stay on her Nexium per schedule prior to the procedure. Will plan to dilate her esophagus at the time of EGD.

## 2012-05-22 NOTE — Telephone Encounter (Signed)
Stop the metoprolol.

## 2012-05-22 NOTE — Telephone Encounter (Signed)
Patient returned call with BP readings.    9/6 - 102/51          106/55   9/7 -    84/51  70              91/53  67            101/60  89               85/50  68  9/8 -      80/46  69              76/45  58   This morning was 84/51  69

## 2012-05-23 ENCOUNTER — Encounter (INDEPENDENT_AMBULATORY_CARE_PROVIDER_SITE_OTHER): Payer: Self-pay | Admitting: *Deleted

## 2012-05-23 NOTE — Telephone Encounter (Signed)
lmom advising patient of Dr Patty Sermons recommendations

## 2012-05-23 NOTE — Telephone Encounter (Signed)
Left message to return call 

## 2012-05-25 NOTE — Telephone Encounter (Signed)
States she had already stopped med on 9/10.  States she can't tell much of a change yet.  Informed her that since it had only been a few days to give a little more time.  Advised to continue to keep log & call back next week with update.  If no improvement, may need to bring in for OV.  Patient verbalized understanding.

## 2012-05-29 ENCOUNTER — Telehealth: Payer: Self-pay | Admitting: Cardiology

## 2012-05-29 ENCOUNTER — Ambulatory Visit: Payer: BC Managed Care – PPO | Admitting: Physician Assistant

## 2012-05-29 NOTE — Telephone Encounter (Signed)
States her head does feel a little woozy headed occasionally.   She has already stopped the Lopressor.  See previous phone notes.  Patient states that she really wants to get this endoscopy done on Friday, 9/20 & wants to make sure she is safe to proceed.  Patient does have OV scheduled for 10/21 with Dr. Myrtis Ser, but is willing to come in before then to get bp issue straight.

## 2012-05-29 NOTE — Telephone Encounter (Signed)
Gene, please advise on below.

## 2012-05-29 NOTE — Telephone Encounter (Signed)
Is pt symptomatic with these BP readings? I will review her at time of f/u OV. In the interim, she can hold the Lopressor 24 hours before her scheduled procedure with Dr Karilyn Cota.

## 2012-05-29 NOTE — Telephone Encounter (Signed)
Patient called to give BP Readings:    05-25-12    89/50    Pulse 86  05-26-12    86/50    Pulse 68                                99/52   Pulse 86                              105/53   Pulse 86                                83/51   Pulse 66           05-27-12  110/59    Pulse 82                             111/61     Pulse 73                    89/52     Pulse 55           05-28-12    91/52     Pulse 66                        78/46     Pulse 68             05-29-12   97/59     Pulse 63  Patient also states that she is schedule for a EGD with Dr. Waldo Laine at [facility].  She wants to make sure that she is able to do this with BP running the way it is.  Please call patient.

## 2012-05-30 NOTE — Telephone Encounter (Signed)
Patient notified of below.  Will continue to monitor BP & keep log.  If no change, will wait till OV on 10/21 with Dr. Myrtis Ser.

## 2012-05-30 NOTE — Telephone Encounter (Signed)
Pt has NL LVF. Increase fluid intake. Proceed with EGD, as planned

## 2012-06-01 MED ORDER — SODIUM CHLORIDE 0.45 % IV SOLN
INTRAVENOUS | Status: DC
  Administered 2012-06-02: 09:00:00 via INTRAVENOUS

## 2012-06-02 ENCOUNTER — Encounter (HOSPITAL_COMMUNITY): Payer: Self-pay

## 2012-06-02 ENCOUNTER — Encounter (HOSPITAL_COMMUNITY)
Admission: RE | Disposition: A | Discharge status: Home / Self Care | Payer: Self-pay | Source: Ambulatory Visit | Attending: Internal Medicine

## 2012-06-02 ENCOUNTER — Ambulatory Visit (HOSPITAL_COMMUNITY)
Admission: RE | Admit: 2012-06-02 | Discharge: 2012-06-02 | Disposition: A | Discharge status: Home / Self Care | Payer: BC Managed Care – PPO | Source: Ambulatory Visit | Attending: Internal Medicine | Admitting: Internal Medicine

## 2012-06-02 DIAGNOSIS — K277 Chronic peptic ulcer, site unspecified, without hemorrhage or perforation: Secondary | ICD-10-CM

## 2012-06-02 DIAGNOSIS — R112 Nausea with vomiting, unspecified: Secondary | ICD-10-CM

## 2012-06-02 DIAGNOSIS — R1013 Epigastric pain: Secondary | ICD-10-CM

## 2012-06-02 DIAGNOSIS — K257 Chronic gastric ulcer without hemorrhage or perforation: Secondary | ICD-10-CM | POA: Insufficient documentation

## 2012-06-02 DIAGNOSIS — K222 Esophageal obstruction: Secondary | ICD-10-CM | POA: Insufficient documentation

## 2012-06-02 DIAGNOSIS — K259 Gastric ulcer, unspecified as acute or chronic, without hemorrhage or perforation: Secondary | ICD-10-CM

## 2012-06-02 DIAGNOSIS — R1310 Dysphagia, unspecified: Secondary | ICD-10-CM | POA: Insufficient documentation

## 2012-06-02 HISTORY — PX: ESOPHAGOGASTRODUODENOSCOPY: SHX5428

## 2012-06-02 LAB — GASTRIC OCCULT BLOOD (1-CARD TO LAB): pH, Gastric: 4

## 2012-06-02 SURGERY — EGD (ESOPHAGOGASTRODUODENOSCOPY)
Anesthesia: Moderate Sedation

## 2012-06-02 MED ORDER — BUTAMBEN-TETRACAINE-BENZOCAINE 2-2-14 % EX AERO
INHALATION_SPRAY | CUTANEOUS | Status: DC | PRN
  Administered 2012-06-02: 2 via TOPICAL

## 2012-06-02 MED ORDER — TRAMADOL HCL 50 MG PO TABS: 50.0000 mg | ORAL_TABLET | Freq: Two times a day (BID) | ORAL | Status: DC | PRN

## 2012-06-02 MED ORDER — STERILE WATER FOR IRRIGATION IR SOLN
Status: DC | PRN
  Administered 2012-06-02: 10:00:00

## 2012-06-02 MED ORDER — MEPERIDINE HCL 25 MG/ML IJ SOLN
INTRAMUSCULAR | Status: DC | PRN
  Administered 2012-06-02: 25 mg via INTRAVENOUS

## 2012-06-02 MED ORDER — MIDAZOLAM HCL 5 MG/5ML IJ SOLN
INTRAMUSCULAR | Status: AC
  Filled 2012-06-02: qty 10

## 2012-06-02 MED ORDER — MIDAZOLAM HCL 5 MG/5ML IJ SOLN
INTRAMUSCULAR | Status: DC | PRN
  Administered 2012-06-02 (×2): 2 mg via INTRAVENOUS

## 2012-06-02 MED ORDER — MEPERIDINE HCL 50 MG/ML IJ SOLN
INTRAMUSCULAR | Status: AC
  Filled 2012-06-02: qty 1

## 2012-06-02 NOTE — Op Note (Signed)
EGD PROCEDURE REPORT  PATIENT:  Lindsay Lawrence  MR#:  [id number] Birthdate:  03-Jun-1956, 56 y.o., female Endoscopist:  Dr. Malissa Hippo, MD Referred By:  Dr. Ignatius Specking, MD Procedure Date: 06/02/2012  Procedure:   EGD with ED gastric fluid aspiration for pH.  Indications:  Patient is 55 year old Caucasian female with the refractory peptic ulcer disease who is complaining of solid food dysphagia. She is undergoing EGD with ED and we'll also check gastric pH.            Informed Consent:  The risks, benefits, alternatives & imponderables which include, but are not limited to, bleeding, infection, perforation, drug reaction and potential missed lesion have been reviewed.  The potential for biopsy, lesion removal, esophageal dilation, etc. have also been discussed.  Questions have been answered.  All parties agreeable.  Please see history & physical in medical record for more information.  Medications:  Demerol 25 mg IV Versed 4 mg IV Cetacaine spray topically for oropharyngeal anesthesia  Description of procedure:  The endoscope was introduced through the mouth and advanced to the second portion of the duodenum without difficulty or limitations. The mucosal surfaces were surveyed very carefully during advancement of the scope and upon withdrawal.  Findings:  Esophagus:  Mucosa of the esophagus was normal. Noncritical narrowing noted at GE junction. GEJ:  39 cm Stomach:  There was scant amount of fluid at proximal stomach which was aspirated for gastric pH. She had some bile in antrum as well. Stomach  distended very well with insufflation. Few erosions are noted at gastric body along the posterior wall along with 3 ulcers at antrum measuring 5-6 mm in diameter. There were 2 scars as well. 2 small pyloric channel ulcers are noted at pyloric channel was patent. Angularis fundus and cardia were examined by retroflexing the scope was normal. Duodenum:  Single bulbar erosion. Post bulbar mucosa  was normal.  Therapeutic/Diagnostic Maneuvers Performed:  Esophagus dilated by passing 56 [place] Maloney dilator. Post dilation esophageal examination revealed no mucosal disruption at proximal esophagus as well as at GE junction.  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 Three small antral ulcers along with erosion at gastric body and angle erosion at bulb. Two small pyloric channel ulcers but  pyloric channel is patent. Gastric Fluid aspirated for pH. Esophageal dilation with 56 [place] Maloney dilator resulted in mucosal disruption at proximal esophagus indicative of a web along with disruption at [employer] junction.  Recommendations:  Standard instructions given. She will continue Nexium as before. Patient will be informed about gastric ph.  Quintasia Theroux U  06/02/2012  9:51 AM  CC: Dr. Ignatius Specking., MD & Dr. Bonnetta Barry ref. provider found

## 2012-06-02 NOTE — H&P (Signed)
Lindsay Lawrence is an 56 y.o. female.   Chief Complaint: Patient is here for EGD, ED. HPI: Patient is 56 year old Caucasian female who is refractory peptic ulcer disease. She has been evaluated at [facility] by Dr. Catheryn Bacon. Was recommended lesion check gastric pH while in therapy to make sure she is effectively acid suppressed. He remains with daily epigastric pain and intermittent nausea and vomiting. She now also complains of solid food dysphagia. Patient has been empirically treated for H. pylori gastritis and gastrin levels have been within normal limits.  Past Medical History  Diagnosis Date  . Irritable bowel syndrome   . Gastric ulcer   . GERD (gastroesophageal reflux disease)   . Heart palpitations     Past Surgical History  Procedure Date  . Givens capsule study 05/03/2011    Procedure: GIVENS CAPSULE STUDY;  Surgeon: Malissa Hippo, MD;  Location: [facility];  Service: Endoscopy;  Laterality: N/A;  7:30 am  . Colonoscopy   . Upper gastrointestinal endoscopy   . Cholecystectomy   . Abdominal hysterectomy   . Esophagogastroduodenoscopy 02/04/2012    Procedure: ESOPHAGOGASTRODUODENOSCOPY (EGD);  Surgeon: Malissa Hippo, MD;  Location: [facility];  Service: Endoscopy;  Laterality: N/A;  320    Family History  Problem Relation Age of Onset  . Healthy Mother   . Heart attack Father   . Healthy Sister    Social History:  reports that she has never smoked. She has never used smokeless tobacco. She reports that she does not drink alcohol or use illicit drugs.  Allergies:  Allergies  Allergen Reactions  . Aspirin     REACTION: ulcer, reflux  . Codeine     REACTION: Speeds heart up, n/v, dizziness  . Fentanyl     REACTION: decreased LOC  . Nsaids     REACTION: reflux, ulcer  . Propoxyphene-Acetaminophen     REACTION: Speeds heart up, n/v, dizziness    Medications Prior to Admission  Medication Sig Dispense Refill  . albuterol (PROVENTIL HFA;VENTOLIN HFA)  108 (90 BASE) MCG/ACT inhaler Inhale 2 puffs into the lungs as needed.      . ALPRAZolam (XANAX) 0.5 MG tablet Take 1 tablet (0.5 mg total) by mouth 3 (three) times daily as needed for sleep or anxiety.  90 tablet  2  . Cholecalciferol 1000 UNITS capsule Take 1,000 Units by mouth daily.      Marland Kitchen esomeprazole (NEXIUM) 40 MG capsule Take 1 capsule (40 mg total) by mouth 2 (two) times daily before a meal.  60 capsule  5  . fluticasone (VERAMYST) 27.5 MCG/SPRAY nasal spray Place 2 sprays into the nose as needed.       . Inulin (FIBERCHOICE) 2 G CHEW Chew 4 tablets by mouth daily.        . mesalamine (CANASA) 1000 MG suppository Place 1 suppository (1,000 mg total) rectally as needed.  30 suppository  2  . mometasone (ASMANEX 120 METERED DOSES) 220 MCG/INH inhaler Inhale 2 puffs into the lungs daily.      . Multiple Vitamins-Minerals (MULTIVITAMIN,TX-MINERALS) tablet Take 1 tablet by mouth daily.        . sucralfate (CARAFATE) 1 G tablet Take 1 tablet (1 g total) by mouth 2 (two) times daily.  60 tablet  5    No results found for this or any previous visit (from the past 48 hour(s)). No results found.  ROS  Blood pressure 115/67, pulse 66, temperature 98.1 F (36.7 C),  temperature source Oral, resp. rate 12. Physical Exam  Constitutional: She appears well-developed.       Thin Caucasian female in NAD  HENT:  Mouth/Throat: Oropharynx is clear and moist.  Eyes: Conjunctivae normal are normal. No scleral icterus.  Neck: No thyromegaly present.  Cardiovascular: Regular rhythm and normal heart sounds.   No murmur heard. Respiratory: Effort normal and breath sounds normal.  GI: Soft. She exhibits no mass. There is Tenderness: mild tenderness across lower abdomen and at epigastrium..  Musculoskeletal: She exhibits no edema.  Lymphadenopathy:    She has no cervical adenopathy.  Neurological: She is alert.  Skin: Skin is warm and dry.     Assessment/Plan Refractory peptic ulcer disease. Solid  food dysphagia. EGD with ED and also check gastric pH.  Torrey Ballinas U 06/02/2012, 9:27 AM

## 2012-06-08 ENCOUNTER — Encounter (HOSPITAL_COMMUNITY): Payer: Self-pay | Admitting: Internal Medicine

## 2012-06-26 ENCOUNTER — Encounter (INDEPENDENT_AMBULATORY_CARE_PROVIDER_SITE_OTHER): Payer: Self-pay | Admitting: Internal Medicine

## 2012-06-26 ENCOUNTER — Ambulatory Visit (INDEPENDENT_AMBULATORY_CARE_PROVIDER_SITE_OTHER): Payer: BC Managed Care – PPO | Admitting: Internal Medicine

## 2012-06-26 VITALS — BP 98/70 | HR 72 | Temp 97.0°F | Resp 20 | Ht 64.0 in | Wt 110.9 lb

## 2012-06-26 DIAGNOSIS — R109 Unspecified abdominal pain: Secondary | ICD-10-CM

## 2012-06-26 DIAGNOSIS — D509 Iron deficiency anemia, unspecified: Secondary | ICD-10-CM

## 2012-06-26 DIAGNOSIS — K279 Peptic ulcer, site unspecified, unspecified as acute or chronic, without hemorrhage or perforation: Secondary | ICD-10-CM

## 2012-06-26 DIAGNOSIS — F419 Anxiety disorder, unspecified: Secondary | ICD-10-CM

## 2012-06-26 DIAGNOSIS — F411 Generalized anxiety disorder: Secondary | ICD-10-CM

## 2012-06-26 MED ORDER — ALPRAZOLAM 0.25 MG PO TABS: 0.2500 mg | ORAL_TABLET | Freq: Three times a day (TID) | ORAL | Status: DC | PRN

## 2012-06-26 MED ORDER — ALPRAZOLAM 0.5 MG PO TABS: 0.2500 mg | ORAL_TABLET | Freq: Three times a day (TID) | ORAL | Status: DC | PRN

## 2012-06-26 MED ORDER — ESOMEPRAZOLE MAGNESIUM 40 MG PO CPDR: 40.0000 mg | DELAYED_RELEASE_CAPSULE | Freq: Two times a day (BID) | ORAL | Status: DC

## 2012-06-26 MED ORDER — SUCRALFATE 1 G PO TABS: 2.0000 g | ORAL_TABLET | Freq: Every day | ORAL | Status: DC

## 2012-06-26 NOTE — Patient Instructions (Signed)
Physician will contact you with results of blood work. If sleep disorder does not improve with reduced dose of alprazolam please make an appointment with Dr. Sherril Croon.

## 2012-06-26 NOTE — Progress Notes (Signed)
Presenting complaint;  Multiple symptoms.  Subjective:  Patient is 56 year old Caucasian female who is here for scheduled visit. She was last seen in the office on 03/27/2012. She underwent EGD with ED on 06/02/2012 Gastric pH was checked as recommended by Dr. Catheryn Bacon at [facility]. Gastric pH was 4. She had 3 antral ulcers along with few erosions and to pyloric channel ulcers. He also at soft stricture at GE junction which was dilated by passing 56 [place] Maloney dilator. She was advised to continue Nexium at twice a day schedule. She does not feel any better. She remains with epigastric as well as lower abdominal pain which occurs every day. Epigastric pain is worse with meals and associated with frequent nausea heaving and intermittent vomiting. She denies taking NSAIDs. She is able to swallow better but still has difficulty with some foods such as rice potatoes and meat. Her bowels move daily. She reports occasional hematochezia when she passes either fresh or.-colored blood. She also complains of intermittent abdominal bloating and her abdomen feels very tight. She continues to complain of frequent heartburn usually after each meal. She continues to complain of feeling tired and drained out and states she sleeps while she's on the phone and once was she was eating her dinner. She also complains of a rash above both elbows. This rash started few weeks ago. She would like to get new prescription for alprazolam Carafate and Nexium.  Current Medications: Current Outpatient Prescriptions  Medication Sig Dispense Refill  . albuterol (PROVENTIL HFA;VENTOLIN HFA) 108 (90 BASE) MCG/ACT inhaler Inhale 2 puffs into the lungs as needed.      . ALPRAZolam (XANAX) 0.5 MG tablet Take 1 tablet (0.5 mg total) by mouth 3 (three) times daily as needed for sleep or anxiety.  90 tablet  2  . Cholecalciferol 1000 UNITS capsule Take 1,000 Units by mouth daily.      Marland Kitchen esomeprazole (NEXIUM) 40 MG capsule Take  1 capsule (40 mg total) by mouth 2 (two) times daily before a meal.  60 capsule  5  . Inulin (FIBERCHOICE) 2 G CHEW Chew 4 tablets by mouth daily.        . mesalamine (CANASA) 1000 MG suppository Place 1 suppository (1,000 mg total) rectally as needed.  30 suppository  2  . mometasone (ASMANEX 120 METERED DOSES) 220 MCG/INH inhaler Inhale 2 puffs into the lungs daily.      . Multiple Vitamins-Minerals (MULTIVITAMIN,TX-MINERALS) tablet Take 1 tablet by mouth daily.        Marland Kitchen nystatin-triamcinolone (MYCOLOG II) cream Apply 1 application topically as needed.       . traMADol (ULTRAM) 50 MG tablet Take 1 tablet (50 mg total) by mouth 2 (two) times daily as needed for pain.  30 tablet  2  . fluticasone (VERAMYST) 27.5 MCG/SPRAY nasal spray Place 2 sprays into the nose as needed.       . sucralfate (CARAFATE) 1 G tablet Take 1 tablet (1 g total) by mouth 2 (two) times daily.  60 tablet  5     Objective: Blood pressure 98/70, pulse 72, temperature 97 F (36.1 C), temperature source Oral, resp. rate 20, height 5\' 4"  (1.626 m), weight 110 lb 14.4 oz (50.304 kg). Patient is alert and in no acute distress. Conjunctiva is pink. Sclera is nonicteric Oropharyngeal mucosa is normal. No neck masses or thyromegaly noted. Cardiac exam with regular rhythm normal S1 and S2. No murmur or gallop noted. Lungs are clear to auscultation. Abdomen  is flat. Bowel sounds are normal. Abdomen is soft with mild generalized tenderness without guarding but it's most pronounced at epigastric region and LLQ. No organomegaly or masses. No LE edema or clubbing noted.   Assessment:  #1. Nonhealing peptic ulcer disease. She has undergone multiple endoscopies including one 4 weeks ago. Last year she also had EGD and colonoscopy at [facility]. Her gastrin levels have been within normal limits and she's been empirically treated for H. pylori infection. Biopsy has been negative for Crohn's disease or other etiologies. Gastric pH  was 4 about 10-12 hours  after Nexium dose which would indicate she is effectively acid suppressed. #2. Lower abdominal pain was likely secondary to IBS. #3. Dysphagia. Significantly improved with esophageal dilation. #4. Sleep disorder. She needs to be further evaluated but will defer to Dr. Sherril Croon. In the meantime she needs to drop alprazolam dose. #5. History of iron deficiency anemia both secondary to GI blood loss and impaired iron absorption. #6. Vitamin D deficiency unresponsive to all sorts of doses. No evidence of steatorrhea order B12/folate deficiency.   Plan:  New prescription given for Nexium at 40 mg twice a day. Decrease alprazolam to 0.25 mg by mouth 3 times a day when necessary. New prescription given for Carafate 2 g by mouth each bedtime. Patient advised to make one with Dr. Sherril Croon to discuss sleep issues. She will go to the lab for CBC, ferritin and potassium level. Will confer with Dr. Catheryn Bacon of [facility] if he has any other thoughts in patient,s management. Office visit in 3 months.

## 2012-06-30 ENCOUNTER — Encounter (INDEPENDENT_AMBULATORY_CARE_PROVIDER_SITE_OTHER): Payer: Self-pay

## 2012-07-03 ENCOUNTER — Ambulatory Visit: Payer: BC Managed Care – PPO | Admitting: Cardiology

## 2012-07-04 ENCOUNTER — Telehealth (INDEPENDENT_AMBULATORY_CARE_PROVIDER_SITE_OTHER): Payer: Self-pay | Admitting: *Deleted

## 2012-07-04 NOTE — Telephone Encounter (Signed)
Lindsay Lawrence was called and states that she has tried taking the Iron by mouth again,but it is bothering her stomach. She says that she will not be able to take them and ask what is Dr.Rehman's recommendation. Lindsay Lawrence ask about a Iron Infusion? Per Dr. Karilyn Cota due to her Hgb being so good a Iron Infusion could not be done. He ask that the patient get [employer] chewable vitamins with Iron and take 2 by mouth daily. Lindsay Lawrence was called and made aware.

## 2012-07-09 ENCOUNTER — Telehealth (INDEPENDENT_AMBULATORY_CARE_PROVIDER_SITE_OTHER): Payer: Self-pay | Admitting: Internal Medicine

## 2012-07-09 NOTE — Telephone Encounter (Signed)
Told patient that i reviewed her condition with Dr. Pamala Hurry at length and touched upon all symptoms; he recommended consultation with Dr. Jorja Loa. Sadiq at [facility]

## 2012-07-09 NOTE — Telephone Encounter (Signed)
Told patient that I reviewed patient,s condition at length with  Dr. Pamala Hurry. He recommended consultation with Dr. Hunt Oris of Surgical svc at [facility]. Patient will let me know.

## 2012-07-11 ENCOUNTER — Encounter (INDEPENDENT_AMBULATORY_CARE_PROVIDER_SITE_OTHER): Payer: Self-pay | Admitting: *Deleted

## 2012-07-11 ENCOUNTER — Telehealth (INDEPENDENT_AMBULATORY_CARE_PROVIDER_SITE_OTHER): Payer: Self-pay | Admitting: *Deleted

## 2012-07-11 NOTE — Telephone Encounter (Signed)
Lindsay Lawrence called and states that since she had talked with Dr.Rehman. She would like to precede with seeing the surgeon at [facility]. She ask that Dr.Rehman go ahead and make the arrangements for her to be seen. As soon as possible,so that she want back out.

## 2012-07-11 NOTE — Telephone Encounter (Signed)
appt w/ Dr Elenore Rota 07/26/12 at 11:00, records faxed, patient aware

## 2012-07-11 NOTE — Telephone Encounter (Signed)
Thanks. i will call Dr. Elenore Rota

## 2012-07-17 ENCOUNTER — Ambulatory Visit: Payer: BC Managed Care – PPO | Admitting: Cardiology

## 2012-07-21 ENCOUNTER — Encounter: Payer: Self-pay | Admitting: Cardiology

## 2012-07-21 DIAGNOSIS — IMO0002 Reserved for concepts with insufficient information to code with codable children: Secondary | ICD-10-CM | POA: Insufficient documentation

## 2012-07-21 DIAGNOSIS — I340 Nonrheumatic mitral (valve) insufficiency: Secondary | ICD-10-CM | POA: Insufficient documentation

## 2012-07-21 DIAGNOSIS — R079 Chest pain, unspecified: Secondary | ICD-10-CM | POA: Insufficient documentation

## 2012-07-21 DIAGNOSIS — R9430 Abnormal result of cardiovascular function study, unspecified: Secondary | ICD-10-CM | POA: Insufficient documentation

## 2012-07-24 ENCOUNTER — Encounter: Payer: Self-pay | Admitting: Cardiology

## 2012-07-24 ENCOUNTER — Ambulatory Visit (INDEPENDENT_AMBULATORY_CARE_PROVIDER_SITE_OTHER): Payer: BC Managed Care – PPO | Admitting: Cardiology

## 2012-07-24 VITALS — BP 106/71 | HR 62 | Ht 64.0 in | Wt 110.0 lb

## 2012-07-24 DIAGNOSIS — J45909 Unspecified asthma, uncomplicated: Secondary | ICD-10-CM

## 2012-07-24 DIAGNOSIS — I340 Nonrheumatic mitral (valve) insufficiency: Secondary | ICD-10-CM

## 2012-07-24 DIAGNOSIS — R0789 Other chest pain: Secondary | ICD-10-CM

## 2012-07-24 DIAGNOSIS — R002 Palpitations: Secondary | ICD-10-CM

## 2012-07-24 DIAGNOSIS — I2789 Other specified pulmonary heart diseases: Secondary | ICD-10-CM

## 2012-07-24 DIAGNOSIS — I059 Rheumatic mitral valve disease, unspecified: Secondary | ICD-10-CM

## 2012-07-24 DIAGNOSIS — R55 Syncope and collapse: Secondary | ICD-10-CM

## 2012-07-24 MED ORDER — METOPROLOL TARTRATE 25 MG PO TABS: 12.5000 mg | ORAL_TABLET | Freq: Two times a day (BID) | ORAL | Status: DC

## 2012-07-24 NOTE — Patient Instructions (Addendum)
Your physician recommends that you schedule a follow-up appointment in: 6 months. You will receive a reminder letter in the mail in about 4 months reminding you to call and schedule your appointment. If you don't receive this letter, please contact our office.  Your physician has recommended you make the following change in your medication: Please restart metoprolol tartrate 12.5 mg twice daily. Your new prescription has been sent to your pharmacy. All other medications will remain the same.

## 2012-07-24 NOTE — Assessment & Plan Note (Signed)
Patient brought a copy of her pulmonary function studies done June , 2013. She does have significant bronchodilator improvement. She is stable in this regard. Very low dose selective beta blocker will not affect this.

## 2012-07-24 NOTE — Progress Notes (Signed)
HPI  Patient is seen today to followup palpitations she is stable today. She has many questions and we talked at length. She broiught a copy of pulmonary function studies done in June, 2013. These showed a mild ventilatory defect. There was significant bronchodilator improvement. It was felt that an inhaler  could help.  She been hospitalized in August, 2013. At that time she was dehydrated and her blood pressure was low. Her low dose beta blocker was stopped at that time. Since then she has had increased palpitations. This is very concerning to her. She worries a great deal because she has significant family history of heart disease.  Allergies  Allergen Reactions  . Aspirin     REACTION: ulcer, reflux  . Codeine     REACTION: Speeds heart up, n/v, dizziness  . Fentanyl     REACTION: decreased LOC  . Nsaids     REACTION: reflux, ulcer  . Propoxyphene-Acetaminophen     REACTION: Speeds heart up, n/v, dizziness    Current Outpatient Prescriptions  Medication Sig Dispense Refill  . ALPRAZolam (XANAX) 0.25 MG tablet Take 1 tablet (0.25 mg total) by mouth 3 (three) times daily as needed for sleep.  90 tablet  2  . Cholecalciferol (VITAMIN D3) 5000 UNITS CAPS Take 1 capsule by mouth daily.      Marland Kitchen esomeprazole (NEXIUM) 40 MG capsule Take 1 capsule (40 mg total) by mouth 2 (two) times daily before a meal.  60 capsule  5  . Inulin (FIBERCHOICE) 2 G CHEW Chew 4 tablets by mouth daily.        . mesalamine (CANASA) 1000 MG suppository Place 1 suppository (1,000 mg total) rectally as needed.  30 suppository  2  . Multiple Vitamins-Minerals (MULTIVITAMIN,TX-MINERALS) tablet Take 1 tablet by mouth daily.        . nitroGLYCERIN (NITROSTAT) 0.4 MG SL tablet Place 0.4 mg under the tongue every 5 (five) minutes as needed. Up to 3 doses. If no relief after 3 rd dose, proceed to ED for evaluation      . nystatin-triamcinolone (MYCOLOG II) cream Apply 1 application topically as needed.       . sucralfate  (CARAFATE) 1 G tablet Take 2 tablets (2 g total) by mouth at bedtime.  60 tablet  5  . traMADol (ULTRAM) 50 MG tablet Take 1 tablet (50 mg total) by mouth 2 (two) times daily as needed for pain.  30 tablet  2  . albuterol (PROVENTIL HFA;VENTOLIN HFA) 108 (90 BASE) MCG/ACT inhaler Inhale 2 puffs into the lungs as needed.      . fluticasone (VERAMYST) 27.5 MCG/SPRAY nasal spray Place 2 sprays into the nose as needed.       . mometasone (ASMANEX 120 METERED DOSES) 220 MCG/INH inhaler Inhale 2 puffs into the lungs daily.        History   Social History  . Marital Status: Married    Spouse Name: N/A    Number of Children: N/A  . Years of Education: N/A   Occupational History  . Not on file.   Social History Main Topics  . Smoking status: Never Smoker   . Smokeless tobacco: Never Used  . Alcohol Use: No  . Drug Use: No  . Sexually Active: Not on file   Other Topics Concern  . Not on file   Social History Narrative  . No narrative on file    Family History  Problem Relation Age of Onset  . Healthy Mother   .  Heart attack Father   . Healthy Sister     Past Medical History  Diagnosis Date  . Irritable bowel syndrome   . Gastric ulcer   . GERD (gastroesophageal reflux disease)   . Palpitations   . Ejection fraction     EF 65%, echo, April 19, 2012  . Mitral regurgitation     Mild, echo, August, 2013  . Chest pain     Nuclear, normal, 2010,  //   hospital August, 2013 no evidence of injury  . Syncope     Vasovagal    Past Surgical History  Procedure Date  . Givens capsule study 05/03/2011    Procedure: GIVENS CAPSULE STUDY;  Surgeon: Malissa Hippo, MD;  [facility];  Service: Endoscopy;  Laterality: N/A;  7:30 am  . Colonoscopy   . Upper gastrointestinal endoscopy   . Cholecystectomy   . Abdominal hysterectomy   . Esophagogastroduodenoscopy 02/04/2012    Procedure: ESOPHAGOGASTRODUODENOSCOPY (EGD);  Surgeon: Malissa Hippo, MD;  [facility];  Service: Endoscopy;  Laterality: N/A;  320  . Esophagogastroduodenoscopy 06/02/2012    Procedure: ESOPHAGOGASTRODUODENOSCOPY (EGD);  Surgeon: Malissa Hippo, MD;  Location: [facility];  Service: Endoscopy;  Laterality: N/A;  1050    Patient Active Problem List  Diagnosis  . DYSLIPIDEMIA  . PULMONARY HYPERTENSION, MILD  . ESOPHAGEAL STRICTURE  . IRRITABLE BOWEL SYNDROME  . SYNCOPE, VASOVAGAL  . PALPITATIONS, RECURRENT  . DYSPNEA  . CHEST PAIN, ATYPICAL  . GERD (gastroesophageal reflux disease)  . PUD (peptic ulcer disease)  . Solitary rectal ulcer  . Vitamin d deficiency  . Leukopenia  . Chest pain  . Mitral regurgitation  . Ejection fraction    ROS   Patient denies fever, chills, headache, sweats, rash, change in vision, change in hearing, chest pain, cough, nausea vomiting, urinary symptoms. All other systems are reviewed and are negative.  PHYSICAL EXAM  Patient is oriented to person time and place. Affect is normal. There is no jugulovenous distention. Lungs are clear. Respiratory effort is nonlabored. Cardiac exam reveals S1 and S2. There no clicks or significant murmurs. The abdomen is soft. Is no peripheral edema. There no musculoskeletal deformities. There are no skin rashes.  Filed Vitals:   07/24/12 1354  BP: 106/71  Pulse: 62  Height: 5\' 4"  (1.626 m)  Weight: 110 lb (49.896 kg)  SpO2: 99%     ASSESSMENT & PLAN

## 2012-07-24 NOTE — Assessment & Plan Note (Signed)
She's not having any recurrent chest pain. 

## 2012-07-24 NOTE — Assessment & Plan Note (Signed)
There is mild mitral regurgitation by echo. This is insignificant. No further workup needed at this time.

## 2012-07-24 NOTE — Assessment & Plan Note (Signed)
The patient is having some recurring palpitations while off beta-blockade. She has normal left ventricular function. We have not documented any significant arrhythmias. I've encouraged her to go back on her very small dose of metoprolol at 12.5 mg twice a day. I will see her back in followup to see how she feels. This very small dose will not interfere with her pulmonary status.

## 2012-07-24 NOTE — Assessment & Plan Note (Signed)
This diagnosis has been listed in the past. The patient has very slight elevation of her pulmonary artery pressure by echo criteria. This is not a marked finding. No further evaluation is needed. She does have mild pulmonary disease.

## 2012-07-24 NOTE — Assessment & Plan Note (Signed)
The patient has not had any recurrent syncope. No further workup is needed.

## 2012-10-09 ENCOUNTER — Ambulatory Visit (INDEPENDENT_AMBULATORY_CARE_PROVIDER_SITE_OTHER): Payer: BC Managed Care – PPO | Admitting: Internal Medicine

## 2012-10-09 ENCOUNTER — Encounter (INDEPENDENT_AMBULATORY_CARE_PROVIDER_SITE_OTHER): Payer: Self-pay | Admitting: Internal Medicine

## 2012-10-09 VITALS — BP 98/72 | HR 70 | Temp 97.7°F | Resp 20 | Ht 64.0 in | Wt 110.5 lb

## 2012-10-09 DIAGNOSIS — F419 Anxiety disorder, unspecified: Secondary | ICD-10-CM

## 2012-10-09 DIAGNOSIS — D509 Iron deficiency anemia, unspecified: Secondary | ICD-10-CM

## 2012-10-09 DIAGNOSIS — R109 Unspecified abdominal pain: Secondary | ICD-10-CM

## 2012-10-09 DIAGNOSIS — K277 Chronic peptic ulcer, site unspecified, without hemorrhage or perforation: Secondary | ICD-10-CM

## 2012-10-09 DIAGNOSIS — F411 Generalized anxiety disorder: Secondary | ICD-10-CM

## 2012-10-09 MED ORDER — ALPRAZOLAM 0.25 MG PO TABS: 0.2500 mg | ORAL_TABLET | Freq: Three times a day (TID) | ORAL | Status: DC | PRN

## 2012-10-09 NOTE — Progress Notes (Signed)
Presenting complaint;  Followup for abdominal pain and refractory peptic ulcer disease.  Subjective:  Patient is 57 year old Caucasian female who is here for scheduled visit. She was last seen on 06/26/2012. She was also seen at [facility] by Dr. Riley Nearing and another surgeon last month. Both of these experts felt surgery for refractory peptic ulcer disease was not a good idea. She was then seen in followup by Dr. Catheryn Bacon of GI service and begun on amitriptyline. She started with 10 mg a day and now is up to 40 mg and she'll increase the dose to 50 mg a day next month. She does not feel any better. She remains  With frequent nausea and sporadic vomiting.she has at least 4-5 days of nausea each week. She also complains of pain across her lower abdomen. She has more bad days than good days.so far she cannot tell any improvement with amitriptyline. Presently she is having diarrhea every 2-3 loose stools per day and occasional hematochezia. She continues to complain of dysphagia.her esophagus was last dilated in September 2013.  she's supposed to call Dr. Pamala Hurry with a progress report after she's been on amitriptyline at 50 mg daily. Patient states that he is considering Remicade or other Biologics as she is wondering if underlying problem is Crohn's disease. She denies fever chills or night sweats. She has not lost any weight since her last visit.  Current Medications: Current Outpatient Prescriptions  Medication Sig Dispense Refill  . albuterol (PROVENTIL HFA;VENTOLIN HFA) 108 (90 BASE) MCG/ACT inhaler Inhale 2 puffs into the lungs as needed.      . ALPRAZolam (XANAX) 0.25 MG tablet Take 1 tablet (0.25 mg total) by mouth 3 (three) times daily as needed for sleep.  90 tablet  2  . amitriptyline (ELAVIL) 25 MG tablet 40 mg at bedtime.       . Cholecalciferol (VITAMIN D3) 5000 UNITS CAPS Take 1 capsule by mouth daily.      Marland Kitchen esomeprazole (NEXIUM) 40 MG capsule Take 1 capsule (40 mg total)  by mouth 2 (two) times daily before a meal.  60 capsule  5  . fluticasone (VERAMYST) 27.5 MCG/SPRAY nasal spray Place 2 sprays into the nose as needed.       . Inulin (FIBERCHOICE) 2 G CHEW Chew 4 tablets by mouth daily.        . mesalamine (CANASA) 1000 MG suppository Place 1 suppository (1,000 mg total) rectally as needed.  30 suppository  2  . metoprolol tartrate (LOPRESSOR) 25 MG tablet Take 0.5 tablets (12.5 mg total) by mouth 2 (two) times daily.  30 tablet  6  . mometasone (ASMANEX 120 METERED DOSES) 220 MCG/INH inhaler Inhale 2 puffs into the lungs daily.      . Multiple Vitamins-Minerals (MULTIVITAMIN,TX-MINERALS) tablet Take 1 tablet by mouth daily.        . nitroGLYCERIN (NITROSTAT) 0.4 MG SL tablet Place 0.4 mg under the tongue every 5 (five) minutes as needed. Up to 3 doses. If no relief after 3 rd dose, proceed to ED for evaluation      . nystatin-triamcinolone (MYCOLOG II) cream Apply 1 application topically as needed.       . Pediatric Multivitamins-Iron (FLINTSTONES PLUS IRON) chewable tablet Chew 1 tablet by mouth daily.      . sucralfate (CARAFATE) 1 G tablet Take 2 tablets (2 g total) by mouth at bedtime.  60 tablet  5  . traMADol (ULTRAM) 50 MG tablet Take 1 tablet (50 mg  total) by mouth 2 (two) times daily as needed for pain.  30 tablet  2     Objective: Blood pressure 98/72, pulse 70, temperature 97.7 F (36.5 C), temperature source Oral, resp. rate 20, height 5\' 4"  (1.626 m), weight 110 lb 8 oz (50.122 kg).  patient is alert and in no acute distress. Conjunctiva is pink. Sclera is nonicteric Oropharyngeal mucosa is normal. No neck masses or thyromegaly noted. Cardiac exam with regular rhythm normal S1 and S2. No murmur or gallop noted. Lungs are clear to auscultation. Abdomen is flat. Bowel sounds are normal. Abdomen is soft with mild to moderate midepigastric tenderness and mild tenderness across lower abdomen. No organomegaly or masses.   No LE edema or clubbing  noted.    Assessment:  #1. PUD. She has refractory disease. Last EGD revealed 3 gastric and two pyloric channel ulcers; multiple biopsies on prior exams  have failed to show granulomas or other etiologies. Serum gastrin levels have been normal . She also has been empirically treated for H. pylori infection.she did have EUS at [facility].patient has been evaluated by two surgeons at [facility] surgery not recommend. It is quite possible that she has gastric and rectal Crohn disease(she has a rectal ulcers). Therefore I wouldn't agree with Dr. Pete Glatter idea of therapy with infliximab or similar agents. #2. IDA. Major etiology felt to be impaired iron absorption due to chronic PPI therapy. Lately her H&H has been normal but ferritin levels have been low. She has undergone multiple EGDs and at least 2 colonoscopies last one of which was in June 2012 at [facility] and she also had small bowel given capsule study in August 2012([facility]).if H&H drops will treat with parenteral iron. #3. Lower abdominal pain felt to be IBS.none of her symptoms responding to amitriptyline so far.   Plan: New prescription for alprazolam issued. Will request records of surgical consultation from [facility]. We'll check CBC and serum ferritin today. Patient will call Dr. Pamala Hurry with progress report in few weeks. If decision is made to treat her with infliximab or similar agent, would be glad to arrange it locally. Office visit in 3 months.

## 2012-10-09 NOTE — Patient Instructions (Signed)
Physician will contact you with results of blood work when completed. 

## 2012-11-08 ENCOUNTER — Telehealth (INDEPENDENT_AMBULATORY_CARE_PROVIDER_SITE_OTHER): Payer: Self-pay | Admitting: *Deleted

## 2012-11-08 NOTE — Telephone Encounter (Signed)
Odile called and states that she is currently decreasing the Amitriptyline to come off of it as it is not helping her. She is wanting to start the medication for the treatment of Crohn's that was discussed at the time of her office visit here and at Dr.Orlando's office.She does not want to go back to [facility]. She also states that she is having burning in her throat , dry mouth, hoarseness. She may reached at home after 7 tonight.

## 2012-11-09 NOTE — Telephone Encounter (Signed)
Once I receive Dr. Pete Glatter note recommendations with initiate therapy Please let me know when his notes available.

## 2012-11-09 NOTE — Telephone Encounter (Signed)
We have rec'd noted from Dr.Orlando. Ready for Dr.Rehman to review and the patient will be contacted with his recommendations. Versa called and made aware.

## 2012-11-09 NOTE — Telephone Encounter (Signed)
Ann , we may have to have Adalie come in and sign release , so that Dr.Rehman may  rec'v notes form her visits. He is awaiting these so that we may initiate therapy. Advise if I need to call Donica. We will need to let Dr.Rehman know when we rec'v the notes.

## 2012-12-07 ENCOUNTER — Encounter (INDEPENDENT_AMBULATORY_CARE_PROVIDER_SITE_OTHER): Payer: Self-pay | Admitting: Internal Medicine

## 2012-12-07 ENCOUNTER — Ambulatory Visit (INDEPENDENT_AMBULATORY_CARE_PROVIDER_SITE_OTHER): Payer: BC Managed Care – PPO | Admitting: Internal Medicine

## 2012-12-07 VITALS — BP 92/48 | HR 64 | Temp 98.0°F | Ht 64.0 in | Wt 111.0 lb

## 2012-12-07 DIAGNOSIS — K279 Peptic ulcer, site unspecified, unspecified as acute or chronic, without hemorrhage or perforation: Secondary | ICD-10-CM

## 2012-12-07 NOTE — Patient Instructions (Addendum)
Will get a gastric emptying study. Will discuss with Dr. Karilyn Cota.

## 2012-12-07 NOTE — Progress Notes (Signed)
Subjective:     Patient ID: Lindsay Lawrence, female   DOB: [birth year], 57 y.o.   MRN: [id number]  HPISymptoms x 2 months. Feeling more upper epigastric pain. Feels like a stabbing pain in her epigastric region. Her pain is usually worse after eating.  Sometimes she will be sitting, and all of a sudden she will have a feeling of being nauseated and then she will have a handful of hot water in her hand.  She says her throat and tongue are very dry.Placed on Amitriptyline last year but is trying to stop because it has not helped.  Appetite is not good. She however has not lost any weight since her last visit. She does do a lot of snacking.  She usually has a BM every 2 days. Stools are formed.  Some bright red blood on the toilet tissue. No NSAIDs.     She was also seen at [facility] by Dr. Riley Nearing and another surgeon in November of 2013.Both of these experts felt surgery for refractory peptic ulcer disease was not a good idea. She was then seen in followup by Dr. Catheryn Bacon of GI service and begun on amitriptyline. She started with 10 mg a day and now is up to 40 mg and she'll increase the dose to 50 mg   10/09/2012 Ferritin: 6.8 10/09/2012 H and H 12.3 and 37.8   EGD 01/28/2012: Indications: Patient is 57 year old Caucasian female with nonhealing peptic ulcer disease with been having worsening pain with nausea vomiting. She had abdominopelvic CT area this week which reveals marked thickening to antrum and dilated duodenum. She is undergoing diagnostic EGD.  Impression:  Mild changes of reflux esophagitis limited to GE junction.  Four small gastric ulcers at antrum along with multiple scars and few erosions at gastric body.  Patent pyloric channel with 2 ulcers.  Normal mucosa of Lawrence second and third part of duodenum.  01/28/2012 CT abdomen/pelvis with CM IMPRESSION:  Thickening and lengthening of the pyloric channel, could be seen as  a result of peptic ulcer disease though gastric  tumor not  completely excluded with this appearance; consider endoscopy.  Dilatation of the second and third portions of the duodenum suspect  related to a nutcracker phenomenon of the third portion of the  duodenum between the SMA and aorta.  No other significant intra abdominal or intrapelvic findings  identified.      Range pH, Gastric 05/2013 4   Occult Blood, Gastric  NEGATIVE   NEGATIVE Resulting Agency [facility] Colonoscopy August 2007: Distal proctitis with ulcer which was biopsied and nonspecific. No changes of Crohn's disease or UC. Sigmoidoscopy in 2008 and November of 2010 with a finding of solid rectal ulcer and biopsies revealing nonspecific inflammation.   She has been treated empirically for H. Pylori infection even though the special stains and serology have been negative. Gluten free diet did not improve her symptoms. She has been evaluated by Dr. Achilles Dunk of [facility] about 5 yrs ago and they felt she did not have celiac disease.   02/17/2011 UGI with small Bowel: No upper GI abnormality was seen. No small bowel abnormality was seen. Post cholecystectomy.     Review of Systems see hpi Current Outpatient Prescriptions  Medication Sig Dispense Refill  . albuterol (PROVENTIL HFA;VENTOLIN HFA) 108 (90 BASE) MCG/ACT inhaler Inhale 2 puffs into the lungs as needed.      . ALPRAZolam (XANAX) 0.25 MG tablet Take 1 tablet (0.25 mg total) by mouth 3 (  three) times daily as needed for sleep.  90 tablet  2  . amitriptyline (ELAVIL) 10 MG tablet Take 25 mg by mouth at bedtime.       . Cholecalciferol (VITAMIN D3) 5000 UNITS CAPS Take 1 capsule by mouth daily.      Marland Kitchen esomeprazole (NEXIUM) 40 MG capsule Take 1 capsule (40 mg total) by mouth 2 (two) times daily before a meal.  60 capsule  5  . fluticasone (VERAMYST) 27.5 MCG/SPRAY nasal spray Place 2 sprays into the nose as needed.       . Inulin (FIBERCHOICE) 2 G CHEW Chew 4 tablets by mouth daily.        . mesalamine (CANASA) 1000 MG  suppository Place 1 suppository (1,000 mg total) rectally as needed.  30 suppository  2  . metoprolol tartrate (LOPRESSOR) 25 MG tablet Take 0.5 tablets (12.5 mg total) by mouth 2 (two) times daily.  30 tablet  6  . mometasone (ASMANEX 120 METERED DOSES) 220 MCG/INH inhaler Inhale 2 puffs into the lungs daily.      . Multiple Vitamins-Minerals (MULTIVITAMIN,TX-MINERALS) tablet Take 1 tablet by mouth daily.        . nitroGLYCERIN (NITROSTAT) 0.4 MG SL tablet Place 0.4 mg under the tongue every 5 (five) minutes as needed. Up to 3 doses. If no relief after 3 rd dose, proceed to ED for evaluation      . nystatin-triamcinolone (MYCOLOG II) cream Apply 1 application topically as needed.       . Pediatric Multivitamins-Iron (FLINTSTONES PLUS IRON) chewable tablet Chew 1 tablet by mouth daily.      . sucralfate (CARAFATE) 1 G tablet Take 2 tablets (2 g total) by mouth at bedtime.  60 tablet  5  . traMADol (ULTRAM) 50 MG tablet Take 1 tablet (50 mg total) by mouth 2 (two) times daily as needed for pain.  30 tablet  2   No current facility-administered medications for this visit.   Past Medical History  Diagnosis Date  . Irritable bowel syndrome   . Gastric ulcer   . GERD (gastroesophageal reflux disease)   . Palpitations   . Ejection fraction     EF 65%, echo, April 19, 2012  . Mitral regurgitation     Mild, echo, August, 2013  . Chest pain     Nuclear, normal, 2010,  //   hospital August, 2013 no evidence of injury  . Syncope     Vasovagal  . Reactive airway disease     Pulmonary function studies from June, 2013, show the patient does have significant response to bronchodilators.   Past Surgical History  Procedure Laterality Date  . Givens capsule study  05/03/2011    Procedure: GIVENS CAPSULE STUDY;  Surgeon: Malissa Hippo, MD;  [facility];  Service: Endoscopy;  Laterality: N/A;  7:30 am  . Colonoscopy    . Upper gastrointestinal endoscopy    . Cholecystectomy    .  Abdominal hysterectomy    . Esophagogastroduodenoscopy  02/04/2012    Procedure: ESOPHAGOGASTRODUODENOSCOPY (EGD);  Surgeon: Malissa Hippo, MD;  [facility];  Service: Endoscopy;  Laterality: N/A;  320  . Esophagogastroduodenoscopy  06/02/2012    Procedure: ESOPHAGOGASTRODUODENOSCOPY (EGD);  Surgeon: Malissa Hippo, MD;  [facility];  Service: Endoscopy;  Laterality: N/A;  1050        Objective:   Physical Exam  Filed Vitals:   12/07/12 1417  BP: 92/48  Pulse: 64  Temp: 98 F (36.7 C)  Height: 5\' 4"  (1.626 m)  Weight: 111 lb (50.349 kg)   Alert and oriented. Skin warm and dry. Oral mucosa is moist.   . Sclera anicteric, conjunctivae is pink. Thyroid not enlarged. No cervical lymphadenopathy. Lungs clear. Heart regular rate and rhythm.  Abdomen is soft. Bowel sounds are positive. No hepatomegaly. No abdominal masses felt. Epigastric  tenderness.  No edema to lower extremities.       Assessment:    PUD, Post prandial nausea. Gastroparesis needs to be ruled out     Plan:    Continue Nexium and Carafate. Will schedule a Gastric emptying study. I could not find in her records here or at [facility] that she has had one. If she has had one it was before 2008.

## 2012-12-14 ENCOUNTER — Telehealth (INDEPENDENT_AMBULATORY_CARE_PROVIDER_SITE_OTHER): Payer: Self-pay | Admitting: *Deleted

## 2012-12-14 NOTE — Telephone Encounter (Signed)
Per Dorene Ar, NP, we need records from [facility]. Advised Natonya to please get records from [facility] sent to [facility]. Voices Understood.

## 2012-12-18 ENCOUNTER — Telehealth (INDEPENDENT_AMBULATORY_CARE_PROVIDER_SITE_OTHER): Payer: Self-pay | Admitting: *Deleted

## 2012-12-18 NOTE — Telephone Encounter (Signed)
Patient called and states that she need a refill on her Phenergan Suppositories as the ones that she has have expired. Per Delrae Rend may call in Phenergan Suppositories 25 mg 1 per rectal  Every 6-8 hours as needed for nausea #30 This was called to the Nicolette Bang in [facility] Patient was called and made aware

## 2012-12-20 ENCOUNTER — Telehealth (INDEPENDENT_AMBULATORY_CARE_PROVIDER_SITE_OTHER): Payer: Self-pay | Admitting: *Deleted

## 2012-12-20 NOTE — Telephone Encounter (Signed)
Patient called. Lindsay Lawrence states that she is feeling so bad.She continues to have pain in her lower stomach , and pain in the right lower abd. Nauseated but not throwing up , cannot eat as she was because it is making her more nauseated.Bowel movements are okay but she has seen some blood in the stool. She says that since she seen [place] , according to her scales ,she is down 2-3 pounds. She is using Questran but does not feel that this is helping. Patient also states that she would like to stay with Dr.Rehman for her care and stop going to [facility].

## 2012-12-25 NOTE — Telephone Encounter (Signed)
Patient needs PPD and hepatitis B surface antigen before Remicade infusion

## 2012-12-25 NOTE — Telephone Encounter (Signed)
Patient called our office, I went over her PPD test and lab test that she needed to have done. I told her that I would also need to get a PA on Remicade , and that process was explained to her. She says that she is going to her PCP and get it and will go to [facility] tomorrow morning and have the lab work.

## 2012-12-27 NOTE — Telephone Encounter (Signed)
Patient called back to the office ans says she has some questions. One was if there was any medications she should stop prior to Remicade Infusion. She said that she was going to keep her appointment with Dr.Orlando on May 5 th. I ask Dr.Rehman the questions. He said he thought that Dr.Orlando had wanted the patient to have the Remicade, I explained. He said, to hold everything until she saw him in May. Patient was told.

## 2013-01-15 ENCOUNTER — Ambulatory Visit (INDEPENDENT_AMBULATORY_CARE_PROVIDER_SITE_OTHER): Payer: BC Managed Care – PPO | Admitting: Internal Medicine

## 2013-01-15 ENCOUNTER — Encounter (INDEPENDENT_AMBULATORY_CARE_PROVIDER_SITE_OTHER): Payer: Self-pay | Admitting: Internal Medicine

## 2013-01-15 VITALS — BP 102/66 | HR 72 | Temp 97.2°F | Resp 18 | Ht 64.0 in | Wt 110.5 lb

## 2013-01-15 DIAGNOSIS — K279 Peptic ulcer, site unspecified, unspecified as acute or chronic, without hemorrhage or perforation: Secondary | ICD-10-CM

## 2013-01-15 DIAGNOSIS — F419 Anxiety disorder, unspecified: Secondary | ICD-10-CM

## 2013-01-15 DIAGNOSIS — F411 Generalized anxiety disorder: Secondary | ICD-10-CM

## 2013-01-15 DIAGNOSIS — D509 Iron deficiency anemia, unspecified: Secondary | ICD-10-CM

## 2013-01-15 DIAGNOSIS — E559 Vitamin D deficiency, unspecified: Secondary | ICD-10-CM

## 2013-01-15 DIAGNOSIS — K626 Ulcer of anus and rectum: Secondary | ICD-10-CM

## 2013-01-15 DIAGNOSIS — R112 Nausea with vomiting, unspecified: Secondary | ICD-10-CM

## 2013-01-15 DIAGNOSIS — R109 Unspecified abdominal pain: Secondary | ICD-10-CM

## 2013-01-15 MED ORDER — NYSTATIN-TRIAMCINOLONE 100000-0.1 UNIT/GM-% EX CREA: 1.0000 "application " | TOPICAL_CREAM | CUTANEOUS | Status: DC | PRN

## 2013-01-15 MED ORDER — ONDANSETRON HCL 4 MG PO TABS: 4.0000 mg | ORAL_TABLET | Freq: Two times a day (BID) | ORAL | Status: DC | PRN

## 2013-01-15 MED ORDER — MESALAMINE 1000 MG RE SUPP: 1000.0000 mg | RECTAL | Status: DC | PRN

## 2013-01-15 MED ORDER — ALPRAZOLAM 0.25 MG PO TABS: 0.2500 mg | ORAL_TABLET | Freq: Three times a day (TID) | ORAL | Status: DC | PRN

## 2013-01-15 MED ORDER — MIRTAZAPINE 7.5 MG PO TABS: 15.0000 mg | ORAL_TABLET | Freq: Every day | ORAL | Status: DC

## 2013-01-15 NOTE — Patient Instructions (Signed)
Go to the lab for blood work next week. Remeron 7.5 mg daily at bedtime for one week. If no side effects increase to 15 mg daily at bedtime. Please note Phenergan and amitriptyline is being discontinued. New medications are ondansetron for nausea and Remeron in place of amitriptyline.

## 2013-01-15 NOTE — Progress Notes (Signed)
Presenting complaint;  Abdominal pain nausea vomiting and weakness.  Subjective:  Patient is 57 year old Caucasian female who is here for scheduled visit. She was last seen on 10/09/2012. She has been evaluated at [facility] on multiple occasions. On her last visit she gave me the impression that if amitriptyline did not work Dr. Catheryn Bacon would recommend a Remicade infusion since she has nonhealing gastric and rectal ulcers. However today she states that she got confused and she meant her Remeron and not Remicade. She continues to feel poorly. She has daily abdominal pain which is more in epigastric region and LLQ. She has daily nausea and vomiting. She usually has dry heaves and rarely brings up liquids or foods. She was also seen by one of the surgeons at [facility] who recommended taking 3 Nexium capsules daily but she is afraid to do so. She remains with intermittent dysphagia to solids. She denies heartburn. She has at least one or 2 episodes of hematochezia every week. She gently pass a small amount of bright red blood per rectum with her bowel movements. Her bowels move every other day. Most of her stools are formed. She states she feels bad. She does not believe she is depressed. At times she looks at food and it makes her sick. She also complains of dry mouth. She is trying to drink more water and Gatorade. She was seen by Dr. Sherril Croon and had blood work 3 weeks ago she does not believe that her ferritin suddenly jumped to 14.  Current Medications: Current Outpatient Prescriptions  Medication Sig Dispense Refill  . albuterol (PROVENTIL HFA;VENTOLIN HFA) 108 (90 BASE) MCG/ACT inhaler Inhale 2 puffs into the lungs as needed.      . ALPRAZolam (XANAX) 0.25 MG tablet Take 1 tablet (0.25 mg total) by mouth 3 (three) times daily as needed for sleep.  90 tablet  2  . amitriptyline (ELAVIL) 10 MG tablet Take 25 mg by mouth at bedtime.       . Cholecalciferol (VITAMIN D3) 5000 UNITS CAPS  Take 1 capsule by mouth daily.      Marland Kitchen esomeprazole (NEXIUM) 40 MG capsule Take 1 capsule (40 mg total) by mouth 2 (two) times daily before a meal.  60 capsule  5  . fluticasone (VERAMYST) 27.5 MCG/SPRAY nasal spray Place 2 sprays into the nose as needed.       . halobetasol (ULTRAVATE) 0.05 % cream Apply topically 2 (two) times daily.       . Inulin (FIBERCHOICE) 2 G CHEW Chew 4 tablets by mouth daily.        . mesalamine (CANASA) 1000 MG suppository Place 1 suppository (1,000 mg total) rectally as needed.  30 suppository  2  . metoprolol tartrate (LOPRESSOR) 25 MG tablet Take 0.5 tablets (12.5 mg total) by mouth 2 (two) times daily.  30 tablet  6  . mometasone (ASMANEX 120 METERED DOSES) 220 MCG/INH inhaler Inhale 2 puffs into the lungs daily.      . Multiple Vitamins-Minerals (MULTIVITAMIN,TX-MINERALS) tablet Take 1 tablet by mouth daily.        . nitroGLYCERIN (NITROSTAT) 0.4 MG SL tablet Place 0.4 mg under the tongue every 5 (five) minutes as needed. Up to 3 doses. If no relief after 3 rd dose, proceed to ED for evaluation      . nystatin-triamcinolone (MYCOLOG II) cream Apply 1 application topically as needed.       . Pediatric Multivitamins-Iron (FLINTSTONES PLUS IRON) chewable tablet Chew 1  tablet by mouth daily.      . promethazine (PHENERGAN) 25 MG tablet Take 25 mg by mouth as needed.       . sucralfate (CARAFATE) 1 G tablet Take 2 tablets (2 g total) by mouth at bedtime.  60 tablet  5  . traMADol (ULTRAM) 50 MG tablet Take 1 tablet (50 mg total) by mouth 2 (two) times daily as needed for pain.  30 tablet  2   No current facility-administered medications for this visit.     Objective: Blood pressure 102/66, pulse 72, temperature 97.2 F (36.2 C), temperature source Oral, resp. rate 18, height 5\' 4"  (1.626 m), weight 110 lb 8 oz (50.122 kg). Patient is alert and in no acute distress. She has slight tremors to both hands. Conjunctiva is pink. Sclera is nonicteric Oropharyngeal  mucosa is normal. No neck masses or thyromegaly noted. Cardiac exam with regular rhythm normal S1 and S2. No murmur or gallop noted. Lungs are clear to auscultation. Abdomen is flat. Bowel sounds are normal. On palpation abdomen is soft with mild generalized tenderness which is more pronounced in epigastric region. No organomegaly or masses noted.  No LE edema or clubbing noted.  Labs/studies Results: From 12/27/2012. WBC 2.7, H&H 11.2 and 35.2 and platelet count 211K. Differential within normal limits. Chemistry panel reveals glucose of 97, BUN 13, creatinine 0.88, serum sodium 146, potassium 4.4, chloride 107, CO2 27, calcium 9.0, bilirubin oh 0.1, AP 49, AST 19, ALT 10, and albumin 4.1 protein of 5.8 Serum iron 91 TIBC 331 saturation 27% and serum ferritin 14.  Assessment:  #1. Nonhealing gastric ulcers. She has had multiple EGDs and fairly extensive workup including normal gastrin levels, empiric therapy for H. pylori infection and she does not consume NSAIDs. Biopsy from these ulcers have not shown any changes to suggest gastric Crohn's disease. She has chronic vitamin D deficiency and I wonder how it plays in 2 peptic ulcer disease. She has been evaluated at [facility] including visit with a surgeon but surgery was not recommended. For now she'll continue PPI and double dose. #2. Chronic nausea and vomiting possibly multifactorial but primarily secondary to peptic ulcer disease. #3. Chronic abdominal pain secondary to peptic ulcer disease and she's also felt to have IBS. She is having side effects with amitriptyline. Given that she has anorexia and some symptoms suggest depression switching her to Remeron as recommended by Dr. Pamala Hurry would be reasonable. #4. Vitamin D. deficiency despite placement with [place] D2 and D3. Workup for malabsorption has been negative. #5. History of iron deficiency anemia. Recent labs revealed normal iron and saturation with low normal ferritin. #6.  Chronic rectal ulcer.   Plan:  Discontinue promethazine and amitriptyline. Remeron 7.5 mg daily for 4-5 days and if she has no side effects she'll increase the dose to 15 mg by mouth daily. Ondansetron 4 mg by mouth twice a day when necessary. New prescription given for alprazolam and Canasa suppositories. She will have CBC serum ferritin and vitamin D level in of this month which would be one month from her last blood work. Office visit in 3 months.

## 2013-01-24 LAB — CBC
Hemoglobin: 11.1 g/dL — ABNORMAL LOW (ref 12.0–15.0)
MCH: 27.8 pg (ref 26.0–34.0)
Platelets: 215 10*3/uL (ref 150–400)
RBC: 3.99 MIL/uL (ref 3.87–5.11)
WBC: 3.9 10*3/uL — ABNORMAL LOW (ref 4.0–10.5)

## 2013-01-26 ENCOUNTER — Telehealth (INDEPENDENT_AMBULATORY_CARE_PROVIDER_SITE_OTHER): Payer: Self-pay | Admitting: *Deleted

## 2013-01-26 NOTE — Telephone Encounter (Signed)
Lindsay Lawrence called the office this morning. She states that she forgot to share a few things with Dr.Rehman last night.. She saw Dr.Orlando on 01/24/13. He was in an agreement with Dr.Rehman about her taking Remron, he stopped her Carafate. He also did a Gastrin Level to make sure that her taking the Nexium three times a day was suppressing the acids. She has not heard from those results yet.  Dr.Orlando told her he knew that Dr. Karilyn Cota had tried to reach him but ask that she share the ways that are best for him to do so. E-mail is the best way: [email or web address] . Call his office @ [phone] and tell his receptionist,Mrs. Meadows the best time and when to reach Dr.Rehman and he will call. He is not at the hospital a lot as he is workingout of a office in [facility]  [name]- she is requesting a copy of her lab work drawn on 01-22-13.

## 2013-01-29 NOTE — Telephone Encounter (Signed)
Lab report mailed to patient

## 2013-01-30 NOTE — Telephone Encounter (Signed)
There is no need for me to call him at the present time. If Remeron does not work then I will

## 2013-02-01 ENCOUNTER — Encounter (INDEPENDENT_AMBULATORY_CARE_PROVIDER_SITE_OTHER): Payer: Self-pay

## 2013-02-26 ENCOUNTER — Other Ambulatory Visit (INDEPENDENT_AMBULATORY_CARE_PROVIDER_SITE_OTHER): Payer: Self-pay | Admitting: *Deleted

## 2013-02-26 ENCOUNTER — Telehealth (INDEPENDENT_AMBULATORY_CARE_PROVIDER_SITE_OTHER): Payer: Self-pay | Admitting: *Deleted

## 2013-02-26 DIAGNOSIS — R109 Unspecified abdominal pain: Secondary | ICD-10-CM

## 2013-02-26 DIAGNOSIS — R112 Nausea with vomiting, unspecified: Secondary | ICD-10-CM

## 2013-02-26 NOTE — Telephone Encounter (Signed)
Patient called and states that she rec'd a e-mail from  Dr.Orlando. He wants her to increase the Remeron to 30 mg at bedtime for week , then increase it to 4 . This was shared with Dr.Rehman.

## 2013-02-26 NOTE — Telephone Encounter (Signed)
It was meant to open a medication refill request

## 2013-03-05 ENCOUNTER — Ambulatory Visit (INDEPENDENT_AMBULATORY_CARE_PROVIDER_SITE_OTHER): Payer: BC Managed Care – PPO | Admitting: Cardiology

## 2013-03-05 ENCOUNTER — Encounter: Payer: Self-pay | Admitting: Cardiology

## 2013-03-05 VITALS — BP 98/66 | HR 65 | Ht 64.0 in | Wt 116.8 lb

## 2013-03-05 DIAGNOSIS — R079 Chest pain, unspecified: Secondary | ICD-10-CM

## 2013-03-05 DIAGNOSIS — R002 Palpitations: Secondary | ICD-10-CM

## 2013-03-05 DIAGNOSIS — E785 Hyperlipidemia, unspecified: Secondary | ICD-10-CM

## 2013-03-05 NOTE — Assessment & Plan Note (Signed)
I discussed the pros and cons with her. I reviewed her labs. Her HDL is 83. She has no proven coronary disease. Her calculated risk score is 1.6. There is no absolute need for a statin at this time. She is pleased with this.

## 2013-03-05 NOTE — Patient Instructions (Addendum)

## 2013-03-05 NOTE — Assessment & Plan Note (Signed)
She's not having any marked palpitations. No change in therapy.

## 2013-03-05 NOTE — Progress Notes (Signed)
HPI   The patient is seen for followup history of chest discomfort and palpitations. She is actually doing well. She does have lung disease and this is her limiting factor. There is no proven coronary disease. I've tried to keep her on a very small dose of metoprolol for her palpitations. She has not had any significant chest pain.  The patient asked me about my advice concerning the approach to her lipids. I reviewed this with her at length.  Allergies  Allergen Reactions  . Aspirin     REACTION: ulcer, reflux  . Codeine     REACTION: Speeds heart up, n/v, dizziness  . Fentanyl     REACTION: decreased LOC  . Nsaids     REACTION: reflux, ulcer  . Propoxyphene-Acetaminophen     REACTION: Speeds heart up, n/v, dizziness    Current Outpatient Prescriptions  Medication Sig Dispense Refill  . albuterol (PROVENTIL HFA;VENTOLIN HFA) 108 (90 BASE) MCG/ACT inhaler Inhale 2 puffs into the lungs as needed.      . ALPRAZolam (XANAX) 0.25 MG tablet Take 1 tablet (0.25 mg total) by mouth 3 (three) times daily as needed for sleep.  90 tablet  2  . Cholecalciferol (VITAMIN D3) 5000 UNITS CAPS Take 1 capsule by mouth daily.      . ciprofloxacin (CIPRO) 500 MG tablet Take 500 mg by mouth 2 (two) times daily.       . clobetasol cream (TEMOVATE) 0.05 % Apply 1 application topically 2 (two) times daily.       Marland Kitchen docusate sodium (COLACE) 100 MG capsule Take 100 mg by mouth as needed for constipation.      Marland Kitchen esomeprazole (NEXIUM) 40 MG capsule Take 1 capsule (40 mg total) by mouth 2 (two) times daily before a meal.  60 capsule  5  . Inulin (FIBERCHOICE) 2 G CHEW Chew 4 tablets by mouth daily.        . mesalamine (CANASA) 1000 MG suppository Place 1 suppository (1,000 mg total) rectally as needed.  30 suppository  2  . metoprolol tartrate (LOPRESSOR) 25 MG tablet Take 0.5 tablets (12.5 mg total) by mouth 2 (two) times daily.  30 tablet  6  . mirtazapine (REMERON) 7.5 MG tablet Take 2 tablets (15 mg total)  by mouth at bedtime.  60 tablet  2  . mometasone (ASMANEX 120 METERED DOSES) 220 MCG/INH inhaler Inhale 2 puffs into the lungs 2 (two) times daily as needed.       . Multiple Vitamins-Minerals (MULTIVITAMIN,TX-MINERALS) tablet Take 1 tablet by mouth daily.        . nitroGLYCERIN (NITROSTAT) 0.4 MG SL tablet Place 0.4 mg under the tongue every 5 (five) minutes as needed. Up to 3 doses. If no relief after 3 rd dose, proceed to ED for evaluation      . nystatin-triamcinolone (MYCOLOG II) cream Apply 1 application topically as needed. Applied to anal canal as directed twice daily for 2 weeks and thereafter on as needed basis  60 g  0  . ondansetron (ZOFRAN) 4 MG tablet Take 1 tablet (4 mg total) by mouth every 12 (twelve) hours as needed for nausea.  30 tablet  1  . Pediatric Multivitamins-Iron (FLINTSTONES PLUS IRON) chewable tablet Chew 1 tablet by mouth daily.      . traMADol (ULTRAM) 50 MG tablet Take 1 tablet (50 mg total) by mouth 2 (two) times daily as needed for pain.  30 tablet  2  . Vitamin D, Ergocalciferol, (DRISDOL)  50000 UNITS CAPS Take 50,000 Units by mouth every 7 (seven) days.        No current facility-administered medications for this visit.    History   Social History  . Marital Status: Married    Spouse Name: N/A    Number of Children: N/A  . Years of Education: N/A   Occupational History  . Not on file.   Social History Main Topics  . Smoking status: Never Smoker   . Smokeless tobacco: Never Used  . Alcohol Use: No  . Drug Use: No  . Sexually Active: Not on file   Other Topics Concern  . Not on file   Social History Narrative  . No narrative on file    Family History  Problem Relation Age of Onset  . Healthy Mother   . Heart attack Father   . Healthy Sister     Past Medical History  Diagnosis Date  . Irritable bowel syndrome   . Gastric ulcer   . GERD (gastroesophageal reflux disease)   . Palpitations   . Ejection fraction     EF 65%, echo, April 19, 2012  . Mitral regurgitation     Mild, echo, August, 2013  . Chest pain     Nuclear, normal, 2010,  //   hospital August, 2013 no evidence of injury  . Syncope     Vasovagal  . Reactive airway disease     Pulmonary function studies from June, 2013, show the patient does have significant response to bronchodilators.    Past Surgical History  Procedure Laterality Date  . Givens capsule study  05/03/2011    Procedure: GIVENS CAPSULE STUDY;  Surgeon: Malissa Hippo, MD;  Location: [facility];  Service: Endoscopy;  Laterality: N/A;  7:30 am  . Colonoscopy    . Upper gastrointestinal endoscopy    . Cholecystectomy    . Abdominal hysterectomy    . Esophagogastroduodenoscopy  02/04/2012    Procedure: ESOPHAGOGASTRODUODENOSCOPY (EGD);  Surgeon: Malissa Hippo, MD;  Location: [facility];  Service: Endoscopy;  Laterality: N/A;  320  . Esophagogastroduodenoscopy  06/02/2012    Procedure: ESOPHAGOGASTRODUODENOSCOPY (EGD);  Surgeon: Malissa Hippo, MD;  Location: [facility];  Service: Endoscopy;  Laterality: N/A;  1050    Patient Active Problem List   Diagnosis Date Noted  . Reactive airway disease   . Chest pain   . Mitral regurgitation   . Ejection fraction   . Leukopenia 03/27/2012  . GERD (gastroesophageal reflux disease) 08/30/2011  . PUD (peptic ulcer disease) 08/30/2011  . Solitary rectal ulcer 08/30/2011  . Vitamin d deficiency 08/30/2011  . DYSPNEA 10/22/2009  . CHEST PAIN, ATYPICAL 10/22/2009  . DYSLIPIDEMIA 05/26/2009  . PULMONARY HYPERTENSION, MILD 05/26/2009  . ESOPHAGEAL STRICTURE 05/26/2009  . IRRITABLE BOWEL SYNDROME 05/26/2009  . SYNCOPE, VASOVAGAL 05/26/2009  . PALPITATIONS, RECURRENT 05/26/2009    ROS    Patient denies fever, chills, headache, sweats, rash, change in vision, change in hearing, chest pain, cough, nausea or vomiting, urinary symptoms. All other systems are reviewed and are negative.  PHYSICAL EXAM   Patient is oriented to person time  and place. Affect is normal. There is no jugulovenous distention. Lungs reveal decreased breath sounds. There is no respiratory distress. Cardiac exam reveals S1 and S2. There no clicks or significant murmurs. The abdomen is soft. There is no peripheral edema. There no musculoskeletal deformities. There are no skin rashes.  Filed Vitals:   03/05/13 1033  BP: 98/66  Pulse: 65  Height: 5\' 4"  (1.626 m)  Weight: 116 lb 12.8 oz (52.98 kg)      ASSESSMENT & PLAN

## 2013-03-05 NOTE — Assessment & Plan Note (Signed)
She's not having any significant chest pain. Nuclear scan 2010 revealed no ischemia.  No further workup.

## 2013-03-14 MED ORDER — MIRTAZAPINE 7.5 MG PO TABS: 15.0000 mg | ORAL_TABLET | Freq: Every day | ORAL | Status: DC

## 2013-04-10 ENCOUNTER — Other Ambulatory Visit (INDEPENDENT_AMBULATORY_CARE_PROVIDER_SITE_OTHER): Payer: Self-pay | Admitting: Internal Medicine

## 2013-04-10 DIAGNOSIS — F419 Anxiety disorder, unspecified: Secondary | ICD-10-CM

## 2013-04-10 MED ORDER — ALPRAZOLAM 0.25 MG PO TABS: 0.2500 mg | ORAL_TABLET | Freq: Three times a day (TID) | ORAL | Status: DC | PRN

## 2013-04-12 ENCOUNTER — Other Ambulatory Visit (INDEPENDENT_AMBULATORY_CARE_PROVIDER_SITE_OTHER): Payer: Self-pay | Admitting: Internal Medicine

## 2013-04-16 ENCOUNTER — Ambulatory Visit (INDEPENDENT_AMBULATORY_CARE_PROVIDER_SITE_OTHER): Payer: BC Managed Care – PPO | Admitting: Internal Medicine

## 2013-05-01 ENCOUNTER — Encounter (INDEPENDENT_AMBULATORY_CARE_PROVIDER_SITE_OTHER): Payer: Self-pay | Admitting: Internal Medicine

## 2013-05-01 ENCOUNTER — Other Ambulatory Visit (INDEPENDENT_AMBULATORY_CARE_PROVIDER_SITE_OTHER): Payer: Self-pay | Admitting: Internal Medicine

## 2013-05-01 ENCOUNTER — Ambulatory Visit (INDEPENDENT_AMBULATORY_CARE_PROVIDER_SITE_OTHER): Payer: BC Managed Care – PPO | Admitting: Internal Medicine

## 2013-05-01 VITALS — BP 98/66 | HR 70 | Temp 98.0°F | Resp 18 | Ht 64.0 in | Wt 115.1 lb

## 2013-05-01 DIAGNOSIS — Z139 Encounter for screening, unspecified: Secondary | ICD-10-CM

## 2013-05-01 DIAGNOSIS — D509 Iron deficiency anemia, unspecified: Secondary | ICD-10-CM

## 2013-05-01 DIAGNOSIS — R109 Unspecified abdominal pain: Secondary | ICD-10-CM

## 2013-05-01 DIAGNOSIS — R112 Nausea with vomiting, unspecified: Secondary | ICD-10-CM

## 2013-05-01 DIAGNOSIS — F411 Generalized anxiety disorder: Secondary | ICD-10-CM

## 2013-05-01 DIAGNOSIS — K626 Ulcer of anus and rectum: Secondary | ICD-10-CM

## 2013-05-01 DIAGNOSIS — F419 Anxiety disorder, unspecified: Secondary | ICD-10-CM

## 2013-05-01 DIAGNOSIS — K279 Peptic ulcer, site unspecified, unspecified as acute or chronic, without hemorrhage or perforation: Secondary | ICD-10-CM

## 2013-05-01 LAB — CBC
HCT: 34.5 % — ABNORMAL LOW (ref 36.0–46.0)
MCV: 85 fL (ref 78.0–100.0)
RBC: 4.06 MIL/uL (ref 3.87–5.11)
WBC: 3.8 10*3/uL — ABNORMAL LOW (ref 4.0–10.5)

## 2013-05-01 MED ORDER — ALPRAZOLAM 0.25 MG PO TABS: 0.2500 mg | ORAL_TABLET | Freq: Three times a day (TID) | ORAL | Status: DC | PRN

## 2013-05-01 MED ORDER — MIRTAZAPINE 7.5 MG PO TABS: 15.0000 mg | ORAL_TABLET | Freq: Every day | ORAL | Status: DC

## 2013-05-01 MED ORDER — ESOMEPRAZOLE MAGNESIUM 40 MG PO CPDR: 40.0000 mg | DELAYED_RELEASE_CAPSULE | Freq: Two times a day (BID) | ORAL | Status: DC

## 2013-05-01 MED ORDER — ONDANSETRON HCL 4 MG PO TABS: 4.0000 mg | ORAL_TABLET | Freq: Two times a day (BID) | ORAL | Status: DC | PRN

## 2013-05-01 NOTE — Patient Instructions (Addendum)
Pentasa 500 mg by mouth after each meal(samples). PPD by primary care physician(TB test) Physician will contact you with results of blood work.

## 2013-05-01 NOTE — Progress Notes (Signed)
Presenting complaint;  Abdominal pain, nausea vomiting and hematochezia. History of refractory PUD. History of rectal ulcer.  Subjective:  Patient is 57 year old Caucasian female who presents for scheduled visit. Following her last visit on 01/15/2013 she was seen by Dr. Catheryn Bacon of [facility] on 01/24/2013 and was given prescription for Lialda. She stopped this medication after one week because she had more pain and nausea. She did not call Dr. Pamala Hurry with progress report. She did have a gastrin level on that visit and was normal. She states she is feeling worse. She has daily epigastric pain with nausea and intermittent vomiting. She has at least 3 spells of vomiting each week. She vomits bilious fluid and/or food. On one occasion she noted scant amount of blood but not within the last 2 weeks. She also complains of pain and left lower quadrant of her abdomen. She has intermittent hematochezia. She generally notices small amount of bright red blood with her bowel movements. She also complains of daily heartburn and intermittent dysphagia to solids. She has not had any episode where she had to regurgitate food bolus for relief. She would develop a rash over her legs and was seen by Dr. Suan Halter and diagnosed with psoriasis. 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 She says it's been frozen few times but has not gone away. Her appetite is fair. She has gained 5 pounds since her last visit.    Current Medications: Current Outpatient Prescriptions  Medication Sig Dispense Refill  . albuterol (PROVENTIL HFA;VENTOLIN HFA) 108 (90 BASE) MCG/ACT inhaler Inhale 2 puffs into the lungs as needed.      . ALPRAZolam (XANAX) 0.25 MG tablet Take 1 tablet (0.25 mg total) by mouth 3 (three) times daily as needed for sleep.  90 tablet  2  . Cholecalciferol (VITAMIN D3) 5000 UNITS CAPS Take 1 capsule by mouth daily.      . clobetasol cream  (TEMOVATE) 0.05 % Apply 1 application topically 2 (two) times daily.       Marland Kitchen docusate sodium (COLACE) 100 MG capsule Take 100 mg by mouth as needed for constipation.      Marland Kitchen esomeprazole (NEXIUM) 40 MG capsule Take 1 capsule (40 mg total) by mouth 2 (two) times daily before a meal.  60 capsule  5  . Inulin (FIBERCHOICE) 2 G CHEW Chew 4 tablets by mouth daily.        . mesalamine (CANASA) 1000 MG suppository Place 1 suppository (1,000 mg total) rectally as needed.  30 suppository  2  . metoprolol tartrate (LOPRESSOR) 25 MG tablet Take 0.5 tablets (12.5 mg total) by mouth 2 (two) times daily.  30 tablet  6  . mirtazapine (REMERON) 7.5 MG tablet Take 2 tablets (15 mg total) by mouth at bedtime.  60 tablet  2  . mometasone (ASMANEX 120 METERED DOSES) 220 MCG/INH inhaler Inhale 2 puffs into the lungs 2 (two) times daily as needed.       . Multiple Vitamins-Minerals (MULTIVITAMIN,TX-MINERALS) tablet Take 1 tablet by mouth daily.        . nitroGLYCERIN (NITROSTAT) 0.4 MG SL tablet Place 0.4 mg under the tongue every 5 (five) minutes as needed. Up to 3 doses. If no relief after 3 rd dose, proceed to ED for evaluation      . nystatin-triamcinolone (MYCOLOG II) cream Apply 1 application topically as needed. Applied to anal canal as directed twice daily for 2  weeks and thereafter on as needed basis  60 g  0  . ondansetron (ZOFRAN) 4 MG tablet Take 1 tablet (4 mg total) by mouth every 12 (twelve) hours as needed for nausea.  30 tablet  1  . Pediatric Multivitamins-Iron (FLINTSTONES PLUS IRON) chewable tablet Chew 1 tablet by mouth daily.      . traMADol (ULTRAM) 50 MG tablet Take 1 tablet (50 mg total) by mouth 2 (two) times daily as needed for pain.  30 tablet  2  . Vitamin D, Ergocalciferol, (DRISDOL) 50000 UNITS CAPS Take 50,000 Units by mouth every 7 (seven) days.        No current facility-administered medications for this visit.     Objective: Blood pressure 98/66, pulse 70, temperature 98 F (36.7  C), temperature source Oral, resp. rate 18, height 5\' 4"  (1.626 m), weight 115 lb 1.6 oz (52.209 kg). Patient is alert and in no acute distress. She has small erythematous papular lesion involving left side of her face. Conjunctiva is pink. Sclera is nonicteric Oropharyngeal mucosa is normal. No neck masses or thyromegaly noted. Cardiac exam with regular rhythm normal S1 and S2. No murmur or gallop noted. Lungs are clear to auscultation. Abdomen is flat. Bowel sounds are normal. No bruits noted. On palpation is soft with moderate midepigastric tenderness and mild tenderness across lower abdomen. No organomegaly or masses.  No LE edema or clubbing noted but she has erythematous scaly rash involving medial aspects of both legs.  Labs/studies Results: from 01/23/2013 WBC 3.9, H&H 11.1 and 33.8 and platelet count 215K. Serum ferritin 8. Vitamin D2 level 11 ng/mL.  Assessment:  #1. Refractory peptic ulcer disease. She has both gastric and duodenal ulcers documented on multiple EGDs and biopsies been negative for granulomas to suggest Crohn's disease. Recent and prior gastric levels have been normal. Ulcer She has also been treated empirically for H. pylori gastritis but without any benefit. Salicylate level at [facility] was negative. Given that she has rectal ulcer, she may well have Crohn's disease. She is willing to be treated with prednisone or Biologics. #2. GERD. Symptoms poorly controlled with double dose PPI and this may be function of poor gastric emptying secondary to peptic ulcer disease. #3. Iron deficiency anemia. At times her H&H has been normal but serum ferritin is always been low. Iron deficiency felt to be secondary to chronic GI blood loss and impaired iron absorption secondary to chronic PPI therapy. #4. Vitamin D deficiency. She has been given very high dose vitamin D orally but level has never corrected. There have been no other abnormalities to suggest malabsorption. #5. Stress  disorder.   Plan:  Patient will go to the lab for CBC, hepatitis B surface antigen, serum ferritin and IBD panel. Empiric trial with Pentasa 500 mg by mouth 3 times a day while waiting for results of blood tests. Samples given to patient. PPD by primary care physician. New prescription given for ondansetron, alprazolam, esomeprazole and mirtazapine. I will be contacting patient was also blood work and further recommendations. Office visit in 3 months.

## 2013-05-02 ENCOUNTER — Other Ambulatory Visit: Payer: Self-pay | Admitting: Cardiology

## 2013-05-02 MED ORDER — NITROGLYCERIN 0.4 MG SL SUBL: 0.4000 mg | SUBLINGUAL_TABLET | SUBLINGUAL | Status: DC | PRN

## 2013-05-04 LAB — OTHER SOLSTAS TEST
Atypical p-ANCA Screen: NEGATIVE
Myeloperoxidase Abs: <1 AU/mL (ref <20)
p-ANCA Screen: NEGATIVE

## 2013-05-10 ENCOUNTER — Other Ambulatory Visit (INDEPENDENT_AMBULATORY_CARE_PROVIDER_SITE_OTHER): Payer: Self-pay | Admitting: Internal Medicine

## 2013-05-10 MED ORDER — PREDNISONE 10 MG PO TABS: 30.0000 mg | ORAL_TABLET | Freq: Every day | ORAL | Status: DC

## 2013-05-10 MED ORDER — TRAMADOL HCL 50 MG PO TABS: 50.0000 mg | ORAL_TABLET | Freq: Two times a day (BID) | ORAL | Status: DC | PRN

## 2013-05-11 NOTE — Progress Notes (Signed)
Apt has been scheduled for 05/29/13 with Dr. Rehman.  

## 2013-05-29 ENCOUNTER — Encounter (INDEPENDENT_AMBULATORY_CARE_PROVIDER_SITE_OTHER): Payer: Self-pay | Admitting: *Deleted

## 2013-05-29 ENCOUNTER — Ambulatory Visit (INDEPENDENT_AMBULATORY_CARE_PROVIDER_SITE_OTHER): Payer: BC Managed Care – PPO | Admitting: Internal Medicine

## 2013-05-29 ENCOUNTER — Other Ambulatory Visit (INDEPENDENT_AMBULATORY_CARE_PROVIDER_SITE_OTHER): Payer: Self-pay | Admitting: *Deleted

## 2013-05-29 ENCOUNTER — Encounter (INDEPENDENT_AMBULATORY_CARE_PROVIDER_SITE_OTHER): Payer: Self-pay | Admitting: Internal Medicine

## 2013-05-29 VITALS — BP 110/68 | HR 66 | Temp 97.9°F | Resp 18 | Ht 64.0 in | Wt 119.2 lb

## 2013-05-29 DIAGNOSIS — R112 Nausea with vomiting, unspecified: Secondary | ICD-10-CM

## 2013-05-29 DIAGNOSIS — K625 Hemorrhage of anus and rectum: Secondary | ICD-10-CM

## 2013-05-29 DIAGNOSIS — R1013 Epigastric pain: Secondary | ICD-10-CM

## 2013-05-29 DIAGNOSIS — R1310 Dysphagia, unspecified: Secondary | ICD-10-CM

## 2013-05-29 DIAGNOSIS — K279 Peptic ulcer, site unspecified, unspecified as acute or chronic, without hemorrhage or perforation: Secondary | ICD-10-CM

## 2013-05-29 DIAGNOSIS — K5900 Constipation, unspecified: Secondary | ICD-10-CM

## 2013-05-29 MED ORDER — POLYETHYLENE GLYCOL 3350 17 G PO PACK: 17.0000 g | PACK | Freq: Every day | ORAL | Status: DC

## 2013-05-29 NOTE — Patient Instructions (Addendum)
Prednisone schedule as follows; 25 mg daily for 5 days. 20 mg daily for 5 days. 15 mg daily for 5 days. 10 mg daily for 5 days. 5 mg daily for 5 days and stop. Esophagogastroduodenoscopy, esophageal dilation and flexible sigmoidoscopy to be scheduled. Take polyethylene glycol half to one scoop daily.

## 2013-05-29 NOTE — Progress Notes (Signed)
Presenting complaint;  Followup for abdominal pain nausea vomiting dysphagia and rectal bleeding.  Subjective:  Patient is 57 year old Caucasian female with multiple medical problems who is here for scheduled visit. She was last seen on 05/01/2013. She was begun on Pentasa. IBD panel was negative. She was begun on prednisone on 05/10/2013. She cannot tell any improvement after having been on prednisone for 2-1/2 weeks. She has vomiting at least 3-4 times a week. She continues to complain of epigastric and lower abdominal pain. Since her last visit she's been constipated and has been using MiraLax on an as-needed basis. On one occasion she took stool softener senna cord as well as Fleet enema. She continues to complain of dysphagia to solids. She points to the lower sternal area site of bolus obstruction. She also has intermittent hematochezia she states she is passing less volume than before. She has gained 4 pounds since her last visit. Since her last visit she was seen by Dr. Myrtis Ser for palpitations and deemed to be stable. He also noted that she had favorable locket panel and did not need any therapy. Her husband Rochel Privett who decided to stay in his car has sent me a note summarizing her symptoms which include abdominal pain, nausea, and lower back pain, fatigue, exhaustion, staying sleepy, palpitations, breathing problems, stuffy nose, snoring, dry mouth, tremors to her left hand and legs,skin problems, difficulty swallowing,, muscle soreness, weight loss, indigestion burping bloating and ice craving etc.  Current Medications: Current Outpatient Prescriptions  Medication Sig Dispense Refill  . albuterol (PROVENTIL HFA;VENTOLIN HFA) 108 (90 BASE) MCG/ACT inhaler Inhale 2 puffs into the lungs as needed.      . ALPRAZolam (XANAX) 0.25 MG tablet Take 1 tablet (0.25 mg total) by mouth 3 (three) times daily as needed for sleep.  90 tablet  2  . Cholecalciferol (VITAMIN D3) 5000 UNITS CAPS Take 1 capsule  by mouth daily.      Marland Kitchen docusate sodium (COLACE) 100 MG capsule Take 100 mg by mouth as needed for constipation.      Marland Kitchen esomeprazole (NEXIUM) 40 MG capsule Take 1 capsule (40 mg total) by mouth 2 (two) times daily before a meal.  60 capsule  5  . Inulin (FIBERCHOICE) 2 G CHEW Chew 4 tablets by mouth daily.        . metoprolol tartrate (LOPRESSOR) 25 MG tablet Take 0.5 tablets (12.5 mg total) by mouth 2 (two) times daily.  30 tablet  6  . mirtazapine (REMERON) 7.5 MG tablet Take 2 tablets (15 mg total) by mouth at bedtime.  60 tablet  5  . mometasone (ASMANEX 120 METERED DOSES) 220 MCG/INH inhaler Inhale 2 puffs into the lungs 2 (two) times daily as needed.       . Multiple Vitamins-Minerals (MULTIVITAMIN,TX-MINERALS) tablet Take 1 tablet by mouth daily.        . nitroGLYCERIN (NITROSTAT) 0.4 MG SL tablet Place 1 tablet (0.4 mg total) under the tongue every 5 (five) minutes as needed. Up to 3 doses. If no relief after 3 rd dose, proceed to ED for evaluation  25 tablet  3  . nystatin-triamcinolone (MYCOLOG II) cream Apply 1 application topically as needed. Applied to anal canal as directed twice daily for 2 weeks and thereafter on as needed basis  60 g  0  . ondansetron (ZOFRAN) 4 MG tablet Take 1 tablet (4 mg total) by mouth every 12 (twelve) hours as needed for nausea.  30 tablet  1  . Pediatric Multivitamins-Iron (  FLINTSTONES PLUS IRON) chewable tablet Chew 1 tablet by mouth daily.      . predniSONE (DELTASONE) 10 MG tablet Take 3 tablets (30 mg total) by mouth daily.  100 tablet  0  . traMADol (ULTRAM) 50 MG tablet Take 1 tablet (50 mg total) by mouth 2 (two) times daily as needed for pain.  30 tablet  2  . Vitamin D, Ergocalciferol, (DRISDOL) 50000 UNITS CAPS Take 50,000 Units by mouth every 7 (seven) days.       . mesalamine (CANASA) 1000 MG suppository Place 1 suppository (1,000 mg total) rectally as needed.  30 suppository  2   No current facility-administered medications for this visit.     Objective: Blood pressure 110/68, pulse 66, temperature 97.9 F (36.6 C), temperature source Oral, resp. rate 18, height 5\' 4"  (1.626 m), weight 119 lb 3.2 oz (54.069 kg). Patient is alert and in no acute distress. Conjunctiva is pink. Sclera is nonicteric Oropharyngeal mucosa is normal. No neck masses or thyromegaly noted. Cardiac exam with regular rhythm normal S1 and S2. No murmur or gallop noted. Lungs are clear to auscultation. Abdomen is flat. Bowel sounds are normal. On palpation soft abdomen with mild tenderness in LLQ and hypogastric region and mild to moderate tenderness in midepigastric region. No hepatosplenomegaly noted.  She has 1+ LE edema around ankles.  Labs/studies Results: Lab data from 05/01/2013. WBC 3.8, H&H 11.3 and 34.5, platelet count 239K. Hepatitis B surface antigen negative. IBD panel was negative.    Assessment:  #1. Peptic ulcer disease. Patient has a refractory gastroduodenal ulcerations. Biopsies are negative for inflammatory bowel disease or other diagnosis. Empiric therapy for H. pylori infection was ineffective and her gastrin levels have been normal. Peptic ulcer disease would explain her nausea vomiting as well as epigastric pain. Empiric therapy with prednisone so far has not provided any relief. #2. Dysphagia. She has history of distal esophageal ring/stricture and has undergone dilations in the past. Her esophagus would be redilated at the time of EGD. #3. Iron deficiency anemia. She is maintaining her H&H at decent level. Iron deficiency anemia is felt to be secondary to impaired iron absorption as well as hematochezia and possibly blood loss from peptic ulcer disease. #4. Lower abdominal pain. She is felt to have IBS. She has chronic rectal ulcer which would not explain her pain and constipation. #5. Vitamin D deficiency. She has remained vitamin D deficient in spite of high-dose oral vitamin D. She does not have any other stigmata of  malabsorption. Folate and B12 levels in the past have been normal. She also has been ruled out for celiac disease. #6. Stress disorder. She remains on alprazolam and mirtazapine. Patient's symptom complex remains an enigma despite evaluation at 2 surgeries centers.  Plan: Take polyethylene glycol 8.5 to17 g by mouth daily rather than on when necessary basis Decrease prednisone dose by 5 mg every 5 days until finished. Esophagogastroduodenoscopy, esophageal dilation and flexible sigmoidoscopy be scheduled in near future.

## 2013-05-30 ENCOUNTER — Encounter (HOSPITAL_COMMUNITY): Payer: Self-pay | Admitting: Pharmacy Technician

## 2013-05-31 ENCOUNTER — Encounter (HOSPITAL_COMMUNITY): Payer: Self-pay | Admitting: *Deleted

## 2013-05-31 ENCOUNTER — Encounter (HOSPITAL_COMMUNITY)
Admission: RE | Disposition: A | Discharge status: Home / Self Care | Payer: Self-pay | Source: Ambulatory Visit | Attending: Internal Medicine

## 2013-05-31 ENCOUNTER — Ambulatory Visit (HOSPITAL_COMMUNITY)
Admission: RE | Admit: 2013-05-31 | Discharge: 2013-05-31 | Disposition: A | Discharge status: Home / Self Care | Payer: BC Managed Care – PPO | Source: Ambulatory Visit | Attending: Internal Medicine | Admitting: Internal Medicine

## 2013-05-31 DIAGNOSIS — Z8711 Personal history of peptic ulcer disease: Secondary | ICD-10-CM | POA: Insufficient documentation

## 2013-05-31 DIAGNOSIS — R112 Nausea with vomiting, unspecified: Secondary | ICD-10-CM

## 2013-05-31 DIAGNOSIS — K2960 Other gastritis without bleeding: Secondary | ICD-10-CM

## 2013-05-31 DIAGNOSIS — K921 Melena: Secondary | ICD-10-CM

## 2013-05-31 DIAGNOSIS — K626 Ulcer of anus and rectum: Secondary | ICD-10-CM

## 2013-05-31 DIAGNOSIS — K625 Hemorrhage of anus and rectum: Secondary | ICD-10-CM

## 2013-05-31 DIAGNOSIS — R1013 Epigastric pain: Secondary | ICD-10-CM

## 2013-05-31 DIAGNOSIS — K259 Gastric ulcer, unspecified as acute or chronic, without hemorrhage or perforation: Secondary | ICD-10-CM

## 2013-05-31 DIAGNOSIS — R1310 Dysphagia, unspecified: Secondary | ICD-10-CM

## 2013-05-31 DIAGNOSIS — K5730 Diverticulosis of large intestine without perforation or abscess without bleeding: Secondary | ICD-10-CM

## 2013-05-31 DIAGNOSIS — R109 Unspecified abdominal pain: Secondary | ICD-10-CM | POA: Insufficient documentation

## 2013-05-31 HISTORY — PX: FLEXIBLE SIGMOIDOSCOPY: SHX5431

## 2013-05-31 HISTORY — PX: ESOPHAGOGASTRODUODENOSCOPY (EGD) WITH ESOPHAGEAL DILATION: SHX5812

## 2013-05-31 SURGERY — SIGMOIDOSCOPY, FLEXIBLE
Anesthesia: Moderate Sedation

## 2013-05-31 MED ORDER — SODIUM CHLORIDE 0.9 % IV SOLN
INTRAVENOUS | Status: DC
  Administered 2013-05-31: 14:00:00 via INTRAVENOUS

## 2013-05-31 MED ORDER — MIDAZOLAM HCL 5 MG/5ML IJ SOLN
INTRAMUSCULAR | Status: DC | PRN
  Administered 2013-05-31: 1 mg via INTRAVENOUS
  Administered 2013-05-31 (×2): 2 mg via INTRAVENOUS

## 2013-05-31 MED ORDER — BUTAMBEN-TETRACAINE-BENZOCAINE 2-2-14 % EX AERO
INHALATION_SPRAY | CUTANEOUS | Status: DC | PRN
  Administered 2013-05-31: 2 via TOPICAL

## 2013-05-31 MED ORDER — STERILE WATER FOR IRRIGATION IR SOLN
Status: DC | PRN
  Administered 2013-05-31: 15:00:00

## 2013-05-31 MED ORDER — MEPERIDINE HCL 25 MG/ML IJ SOLN
INTRAMUSCULAR | Status: DC | PRN
  Administered 2013-05-31 (×2): 25 mg via INTRAVENOUS

## 2013-05-31 MED ORDER — MEPERIDINE HCL 50 MG/ML IJ SOLN
INTRAMUSCULAR | Status: AC
  Filled 2013-05-31: qty 1

## 2013-05-31 MED ORDER — MIDAZOLAM HCL 5 MG/5ML IJ SOLN
INTRAMUSCULAR | Status: AC
  Filled 2013-05-31: qty 10

## 2013-05-31 NOTE — H&P (Signed)
Lindsay Lawrence is an 57 y.o. female.   Chief Complaint: Patient's here for EGD, EGD and flexible sigmoidoscopy. HPI: Patient is 57 year old Caucasian female with complicated medical history. She has gastroduodenal ulcers which have failed to healed. Biopsies have been negative for IBD or other diagnosis. She has been evaluated at [facility] as well as [facility]. She was empirically begun on prednisone over 2 weeks ago and is not feeling any better. She remains with frequent nausea vomiting epigastric and LLQ abdominal pain. She also has chronic hematochezia. She has known rectal ulcer. She is also complaining of dysphagia. Her esophagus has been dilated in the past. She also has iron deficiency anemia and vitamin D deficiency. She has undergone multiple other studies in the past.  Past Medical History  Diagnosis Date  . Irritable bowel syndrome   . Gastric ulcer   . GERD (gastroesophageal reflux disease)   . Palpitations   . Ejection fraction     EF 65%, echo, April 19, 2012  . Mitral regurgitation     Mild, echo, August, 2013  . Chest pain     Nuclear, normal, 2010,  //   hospital August, 2013 no evidence of injury  . Syncope     Vasovagal  . Reactive airway disease     Pulmonary function studies from June, 2013, show the patient does have significant response to bronchodilators.    Past Surgical History  Procedure Laterality Date  . Givens capsule study  05/03/2011    Procedure: GIVENS CAPSULE STUDY;  Surgeon: Lindsay Hippo, MD;  Location: [facility];  Service: Endoscopy;  Laterality: N/A;  7:30 am  . Colonoscopy    . Upper gastrointestinal endoscopy    . Cholecystectomy    . Abdominal hysterectomy    . Esophagogastroduodenoscopy  02/04/2012    Procedure: ESOPHAGOGASTRODUODENOSCOPY (EGD);  Surgeon: Lindsay Hippo, MD;  Location: [facility];  Service: Endoscopy;  Laterality: N/A;  320  . Esophagogastroduodenoscopy  06/02/2012    Procedure: ESOPHAGOGASTRODUODENOSCOPY (EGD);   Surgeon: Lindsay Hippo, MD;  Location: [facility];  Service: Endoscopy;  Laterality: N/A;  1050    Family History  Problem Relation Age of Onset  . Healthy Mother   . Heart attack Father   . Healthy Sister    Social History:  reports that she has never smoked. She has never used smokeless tobacco. She reports that she does not drink alcohol or use illicit drugs.  Allergies:  Allergies  Allergen Reactions  . Aspirin Other (See Comments)    Ulcer, Reflux  . Codeine Nausea And Vomiting    Speeds heart up, dizziness  . Fentanyl Other (See Comments)    Unknown   . Nsaids Other (See Comments)    reflux, ulcer  . Propoxyphene-Acetaminophen Nausea And Vomiting    Speeds heart up, dizziness    Medications Prior to Admission  Medication Sig Dispense Refill  . ALPRAZolam (XANAX) 0.25 MG tablet Take 0.25 mg by mouth 3 (three) times daily as needed for sleep or anxiety.      . Cholecalciferol (VITAMIN D3) 5000 UNITS CAPS Take 1 capsule by mouth daily.      Marland Kitchen esomeprazole (NEXIUM) 40 MG capsule Take 40 mg by mouth daily.      . metoprolol tartrate (LOPRESSOR) 25 MG tablet Take 12.5 mg by mouth daily.       . mirtazapine (REMERON) 7.5 MG tablet Take 7.5 mg by mouth at bedtime.      Marland Kitchen  mometasone (ASMANEX 120 METERED DOSES) 220 MCG/INH inhaler Inhale 2 puffs into the lungs daily.       . ondansetron (ZOFRAN) 4 MG tablet Take 1 tablet (4 mg total) by mouth every 12 (twelve) hours as needed for nausea.  30 tablet  1  . polyethylene glycol (MIRALAX / GLYCOLAX) packet Take 17 g by mouth daily as needed (Constipation).      . predniSONE (DELTASONE) 10 MG tablet Take 25 mg by mouth daily.      Marland Kitchen albuterol (PROVENTIL HFA;VENTOLIN HFA) 108 (90 BASE) MCG/ACT inhaler Inhale 2 puffs into the lungs as needed for wheezing or shortness of breath.       . Inulin (FIBERCHOICE) 2 G CHEW Chew 4 tablets by mouth daily.        . Multiple Vitamin (MULTIVITAMIN WITH MINERALS) TABS tablet Take 1 tablet by mouth  daily.      . nitroGLYCERIN (NITROSTAT) 0.4 MG SL tablet Place 1 tablet (0.4 mg total) under the tongue every 5 (five) minutes as needed. Up to 3 doses. If no relief after 3 rd dose, proceed to ED for evaluation  25 tablet  3  . senna (SENOKOT) 8.6 MG TABS tablet Take 1 tablet by mouth daily as needed (Constipation).      . traMADol (ULTRAM) 50 MG tablet Take 1 tablet (50 mg total) by mouth 2 (two) times daily as needed for pain.  30 tablet  2  . Vitamin D, Ergocalciferol, (DRISDOL) 50000 UNITS CAPS Take 50,000 Units by mouth every 7 (seven) days. Takes on Monday        No results found for this or any previous visit (from the past 48 hour(s)). No results found.  ROS  Blood pressure 120/77, pulse 69, temperature 98.2 F (36.8 C), temperature source Oral, resp. rate 18, height 5\' 4"  (1.626 m), weight 119 lb (53.978 kg), SpO2 98.00%. Physical Exam  Constitutional:  Well-developed thin Caucasian female in NAD  HENT:  Mouth/Throat: Oropharynx is clear and moist.  Eyes: Conjunctivae are normal. No scleral icterus.  Neck: No thyromegaly present.  Cardiovascular: Normal rate, regular rhythm and normal heart sounds.   No murmur heard. Respiratory: Effort normal and breath sounds normal.  GI:  Symmetrical abdomen is soft with mild generalized tenderness which is most pronounced at midepigastric region. No organomegaly or masses noted.  Musculoskeletal: She exhibits no edema.  Lymphadenopathy:    She has no cervical adenopathy.  Neurological: She is alert.  Skin: Skin is warm and dry.     Assessment/Plan Refractory peptic ulcer disease. Nausea vomiting and dysphagia. History of rectal ulcer and intermittent hematochezia. EGD with ED and flexible sigmoidoscopy.  Lindsay Lawrence U 05/31/2013, 2:49 PM

## 2013-05-31 NOTE — Op Note (Signed)
EGD AND FLEXIBLE SIGMOIDOSCOPY PROCEDURE REPORT  PATIENT:  Lindsay Lawrence  MR#:  [id number] Birthdate:  06-Nov-1955, 57 y.o., female Endoscopist:  Dr. Malissa Hippo, MD Referred By:  Dr. Bonnetta Barry ref. provider found Procedure Date: 05/31/2013  Procedure:   EGD & flexible sigmoidoscopy.  Indications:  Patient is 57 year old Caucasian female with worsening abdominal pain nausea and vomiting. She also has a hematochezia. She has history of refractory peptic ulcer disease and chronic rectal ulcer.            Informed Consent:  The risks, benefits, alternatives & imponderables which include, but are not limited to, bleeding, infection, perforation, drug reaction and potential missed lesion have been reviewed.  The potential for biopsy, lesion removal, esophageal dilation, etc. have also been discussed.  Questions have been answered.  All parties agreeable.  Please see history & physical in medical record for more information.  Medications:  Demerol 50 mg IV Versed 5 mg IV Cetacaine spray topically for oropharyngeal anesthesia  EGD  Description of procedure:  The endoscope was introduced through the mouth and advanced to the second portion of the duodenum without difficulty or limitations. The mucosal surfaces were surveyed very carefully during advancement of the scope and upon withdrawal.  Findings:  Esophagus:  Mucosa of the esophagus was normal. GE junction was unremarkable. GEJ:  38 cm Hiatus:  40 cm Stomach:  Stomach was empty and distended very well with insufflation. Folds in the proximal stomach are normal. Examination of mucosa at gastric body was normal. Five small ulcers as noted at antrum measuring 46 mm. They appear to be chronic some scarring around them. They all had clean base. Few small antral scars are also noted. Large prepyloric ulcer noted extending into pyloric channel. This ulcer involved 50% of circumference. Angularis fundus and cardia were normal. Duodenum:  Previously  identified bulbar ulcer had healed. Two small erosions noted. Post bulbar mucosa was normal.  Therapeutic/Diagnostic Maneuvers Performed:   Esophagus was dilated by passing 54 [place] Maloney dilator to full insertion.. Endoscope was passed again and his aphasia mucosa reexamined and no mucosal disruption noted. Biopsy was taken from large prepyloric ulcer and small antral ulcer and submitted together.  FLEXIBLE SIGMOIDOSCOPY; Description of procedure:  After a digital rectal exam was performed, that colonoscope was advanced from the anus through the rectum into sigmoid colon. Scope was passed to 40 cm. As a school was withdrawn mucosa was carefully examined and findings noted. Rectal mucosa was also examined and scope was retroflexed.  Findings:   Normal mucosa of sigmoid colon. Two small diverticula at sigmoid colon. Small distal rectal ulcer surrounded by large scar. Unremarkable anorectal junction.  Therapeutic/Diagnostic Maneuvers Performed:  None  Complications:  None    Impression:  No evidence of erosive esophagitis, ring or stricture. Small sliding hiatal hernia. Five small antral ulcers 4-6 mm in diameter along with few scars. Large semi-lunar-shaped prepyloric ulcer extending into pyloric channel. This ulcer involved 50% of circumference and at least 2 cm long. Biopsy taken from this and another ulcer and submitted together.  Bulbar erosions. Esophagus was dilated by passing 54 [place] Maloney dilator but no mucosal disruption induced. Small rectal ulcer in the background of large scar implying significant healing. Few small diverticula and sigmoid colon.    Recommendations:  Increase Nexium to 40 mg by mouth twice a day. I will be contacting patient biopsy results and further recommendations. Patient is aware that she is aware not to take NSAIDs in any shape  or form.  REHMAN,NAJEEB U  05/31/2013 3:32 PM  CC: Dr. Ignatius Specking., MD & Dr. Bonnetta Barry ref. provider  found

## 2013-06-04 ENCOUNTER — Encounter (HOSPITAL_COMMUNITY): Payer: Self-pay | Admitting: Internal Medicine

## 2013-06-11 ENCOUNTER — Telehealth (INDEPENDENT_AMBULATORY_CARE_PROVIDER_SITE_OTHER): Payer: Self-pay | Admitting: *Deleted

## 2013-06-11 NOTE — Telephone Encounter (Signed)
Per Dr.Rehman release Bx, Procedure Note to her to take to Dr.Overby @ [facility]. Patient already has the pictures. Sheralyn ask that she be called when this is ready for her to pick up.

## 2013-06-11 NOTE — Telephone Encounter (Signed)
Reports have been printed and patient aware they are ready for her to pick up

## 2013-06-12 ENCOUNTER — Encounter (INDEPENDENT_AMBULATORY_CARE_PROVIDER_SITE_OTHER): Payer: Self-pay | Admitting: *Deleted

## 2013-06-13 ENCOUNTER — Telehealth (INDEPENDENT_AMBULATORY_CARE_PROVIDER_SITE_OTHER): Payer: Self-pay | Admitting: *Deleted

## 2013-06-13 NOTE — Telephone Encounter (Signed)
Lindsay Lawrence would like to know if Dr. Karilyn Cota can get [facility] in sooner with a surgeon than 07/18/13. Her apt with a surgeon at [facility] is 07/18/13.   Lindsay Lawrence said Larri will not make it until 07/18/13. His return phone number is [phone].

## 2013-06-14 NOTE — Telephone Encounter (Signed)
Lindsay Lawrence was called and given the instructions per Dr.Rehman. He states that Dr.Orlando said that Dr.Rehman could arrange someone here that may see her sooner. I will talk with Dr.Rehman 06-15-13. Then call the patient back.

## 2013-06-14 NOTE — Telephone Encounter (Signed)
I spoke with Lindsay Lawrence. He states that they have sent a e-mail to  Dr.Orlando and his response was that would be the soonest as that surgeon was only in office 1 day per week,unless there was a cancellation. He also ask them to see if Dr.Rehman knew a Surgeon here or at [facility] that she may see sooner that 07/18/13, this would be okay with him.  Lindsay Lawrence was advised that Dr.Rehman would be ask that and that I would respond to him as soon as I heard from Dr.Rehman.

## 2013-06-14 NOTE — Telephone Encounter (Signed)
I would recommend waiting until she can see the surgeon. If pain gets worse or she has vomiting she should directly had to [facility]. If she wants to go [place] I would be glad to arrange consultation.

## 2013-06-14 NOTE — Telephone Encounter (Signed)
I do not know the surgeon. I would suggest she go to [facility] ER if she gets sick her pain is worse. Please call the patient. She may also want to e-mail to Dr .Catheryn Bacon who works with these surgeons

## 2013-06-15 NOTE — Telephone Encounter (Signed)
I did talk with Dr. Deveron Furlong this afternoon. He has kindly agreed to see patient. Will send records Monday morning to his office. Patient informed

## 2013-06-15 NOTE — Telephone Encounter (Signed)
Forwarded to Dr.Rehman to address , as he was going to call Dr. Evette Cristal.

## 2013-06-18 NOTE — Telephone Encounter (Signed)
Ann- We are to send records to Dr.Shin this morning per Dr.Rehman.

## 2013-06-18 NOTE — Telephone Encounter (Signed)
appt w/ Dr Flonnie Hailstone 06/29/13 at 245 (230), notes faxed, patient aware

## 2013-06-27 ENCOUNTER — Other Ambulatory Visit (INDEPENDENT_AMBULATORY_CARE_PROVIDER_SITE_OTHER): Payer: Self-pay | Admitting: *Deleted

## 2013-06-27 ENCOUNTER — Telehealth (INDEPENDENT_AMBULATORY_CARE_PROVIDER_SITE_OTHER): Payer: Self-pay | Admitting: *Deleted

## 2013-06-27 NOTE — Telephone Encounter (Signed)
Patient called and states that her Nexium BID will run out 07/06/13. I called [phone] [employer]/Renee A PA was done over phone Approved from the dates 06/05/13 -- 06/27/14, case Id [id number] Patient called and made aware.

## 2013-07-04 ENCOUNTER — Telehealth (INDEPENDENT_AMBULATORY_CARE_PROVIDER_SITE_OTHER): Payer: Self-pay | Admitting: *Deleted

## 2013-07-04 NOTE — Telephone Encounter (Signed)
I agree with plans and hopes for the best.

## 2013-07-04 NOTE — Telephone Encounter (Signed)
Patient called and states that she is scheduled for surgery 07/19/13.  She is asking if there is anything else that Dr.Rehman would like to discuss with her about the upcoming surgery, if so she can be reached at her home phone after 6:30 pm.

## 2013-07-19 ENCOUNTER — Other Ambulatory Visit: Payer: Self-pay

## 2013-08-06 ENCOUNTER — Encounter (INDEPENDENT_AMBULATORY_CARE_PROVIDER_SITE_OTHER): Payer: Self-pay | Admitting: Internal Medicine

## 2013-08-06 ENCOUNTER — Ambulatory Visit (INDEPENDENT_AMBULATORY_CARE_PROVIDER_SITE_OTHER): Payer: BC Managed Care – PPO | Admitting: Internal Medicine

## 2013-08-06 VITALS — BP 102/70 | HR 66 | Temp 97.4°F | Resp 18 | Ht 64.0 in | Wt 104.1 lb

## 2013-08-06 DIAGNOSIS — T8189XA Other complications of procedures, not elsewhere classified, initial encounter: Secondary | ICD-10-CM

## 2013-08-06 DIAGNOSIS — R634 Abnormal weight loss: Secondary | ICD-10-CM

## 2013-08-06 NOTE — Patient Instructions (Signed)
Wash wound with saline and keep it covered with sterile gauze. Call Dr. Rondel Baton office for further recommendations.

## 2013-08-06 NOTE — Progress Notes (Signed)
Presenting complaint;  Nonhealing abdominal wound from recent surgery.  Database;  Patient is 57 year old Caucasian female who underwent surgery for enlarging nonhealing gastroduodenal ulcer on 07/19/2013 by Dr. Deveron Furlong of [facility]. She underwent antrectomy with Billroth I anastomosis and truncal vagotomy. She had uneventful recovery and was discharged on 07/25/2013. She vein for postop visit and have sutures removed. She has developed open wound towards the lower end of scar. She was told that he would heal in 3 days but it has not. She is keeping it covered with sterile dressing. She has noted drainage but it does not have an odor. She has noted no local pain or soreness but denies fever or chills.  Current Medications: Current Outpatient Prescriptions  Medication Sig Dispense Refill  . albuterol (PROVENTIL HFA;VENTOLIN HFA) 108 (90 BASE) MCG/ACT inhaler Inhale 2 puffs into the lungs as needed for wheezing or shortness of breath.       . ALPRAZolam (XANAX) 0.25 MG tablet Take 0.25 mg by mouth 3 (three) times daily as needed for sleep or anxiety.      . Azelastine HCl 0.15 % SOLN as needed.       . Cholecalciferol (VITAMIN D3) 5000 UNITS CAPS Take 1 capsule by mouth daily.      Marland Kitchen FLUVIRIN PRESERVATIVE FREE SUSP injection       . Inulin (FIBERCHOICE) 2 G CHEW Chew 4 tablets by mouth daily.        . metoprolol tartrate (LOPRESSOR) 25 MG tablet Take 12.5 mg by mouth daily.       . mometasone (ASMANEX 120 METERED DOSES) 220 MCG/INH inhaler Inhale 2 puffs into the lungs daily.       . Multiple Vitamin (MULTIVITAMIN WITH MINERALS) TABS tablet Take 1 tablet by mouth daily.      . nitroGLYCERIN (NITROSTAT) 0.4 MG SL tablet Place 1 tablet (0.4 mg total) under the tongue every 5 (five) minutes as needed. Up to 3 doses. If no relief after 3 rd dose, proceed to ED for evaluation  25 tablet  3  . ondansetron (ZOFRAN) 4 MG tablet Take 1 tablet (4 mg total) by mouth every 12 (twelve) hours as needed for  nausea.  30 tablet  1  . polyethylene glycol (MIRALAX / GLYCOLAX) packet Take 17 g by mouth daily as needed (Constipation).      Marland Kitchen senna (SENOKOT) 8.6 MG TABS tablet Take 1 tablet by mouth daily as needed (Constipation).      . Vitamin D, Ergocalciferol, (DRISDOL) 50000 UNITS CAPS Take 50,000 Units by mouth every 7 (seven) days. Takes on Monday      . esomeprazole (NEXIUM) 40 MG capsule Take 40 mg by mouth daily.      . traMADol (ULTRAM) 50 MG tablet Take 1 tablet (50 mg total) by mouth 2 (two) times daily as needed for pain.  30 tablet  2   No current facility-administered medications for this visit.     Objective: Blood pressure 102/70, pulse 66, temperature 97.4 F (36.3 C), temperature source Oral, resp. rate 18, height 5\' 4"  (1.626 m), weight 104 lb 1.6 oz (47.219 kg). Patient is alert and in no acute distress Conjunctiva is pink. Sclera is nonicteric Oropharyngeal mucosa is normal. No neck masses or thyromegaly noted. Cardiac exam with regular rhythm normal S1 and S2. No murmur or gallop noted. Lungs are clear to auscultation. Abdomen is symmetrical with upper midline surgical scar but Steri-Strips across it. There is a small open wound to its lower end  of the scar it is triangular in about 4-5 mm. Scan amount of drainage which has no order. Site is somewhat indurated. The rest of the abdomen is soft with mild tenderness at LLQ. No organomegaly or masses. No LE edema or clubbing noted.    Assessment:  #1. Nonhealing postsurgical wound possibly related to suture reaction. If this wound does not heal over a matter of a few days she will need to contact Dr. Rondel Baton office. #2. Status post truncal vagotomy and antrectomy for chronic peptic ulcer disease about 3 weeks ago. Patient is doing very well.    Plan:  Apply neomycin skin ointment to wound once or twice daily and keep it covered with sterile dressing. Call if drainage has odor. Office visit with Dr. Flonnie Hailstone if up and wound  does not heal within one week. Office visit in 3 months.

## 2013-08-08 ENCOUNTER — Telehealth (INDEPENDENT_AMBULATORY_CARE_PROVIDER_SITE_OTHER): Payer: Self-pay | Admitting: *Deleted

## 2013-08-08 NOTE — Telephone Encounter (Signed)
I do not handle the billing. I will share this with office manager after talking with [facility].

## 2013-08-08 NOTE — Telephone Encounter (Signed)
Lindsay Lawrence would like to speak with [name] about a bill she received from [employer] DOS: 05/01/13 was denied due to diagnose. Was all of her diagnoses used for that date and if not could we send them to see if they would help in getting the claim paid. Her return phone number is [phone].

## 2013-08-08 NOTE — Telephone Encounter (Signed)
Valetta called back and said the diagnoses sent was for preventive care and should have been for ulcers. She is at the hospital room #338.

## 2013-08-08 NOTE — Telephone Encounter (Signed)
I have spoken with Stefana. She states that she got her EOB for the date of service 05/01/13. She was told that there ws a code submitted that indicated that this was a test for preventive care ,making her illegible for payment. The amount is $ 79.00. She called [facility] and they ask that she call the provider and see if they would submit to them another code and they would resubmit the claim. Aurilla was told that I would review this with Reba,Office Manager, as all I see if related to PUD, Rectal Ulceration and IDA. Patient was okay with this Forwarded to Reba to review with me.

## 2013-08-12 NOTE — Telephone Encounter (Signed)
I would have to see this bill from [facility] to see what she is talking about or the EOB.  This not from an office encounter so I have not idea what she needs but would be willing to try to help you and her.

## 2013-08-13 NOTE — Telephone Encounter (Signed)
Patient advised to bring a copy of her [employer] and insurance EOB so we can get a copy. Our office will look at it further to see if we can help. Voice understood.

## 2013-08-24 ENCOUNTER — Other Ambulatory Visit: Payer: Self-pay | Admitting: Cardiology

## 2013-08-24 MED ORDER — METOPROLOL TARTRATE 25 MG PO TABS: 12.5000 mg | ORAL_TABLET | Freq: Every day | ORAL | Status: DC

## 2013-08-27 ENCOUNTER — Other Ambulatory Visit (HOSPITAL_COMMUNITY): Payer: Self-pay | Admitting: Internal Medicine

## 2013-08-28 ENCOUNTER — Other Ambulatory Visit (INDEPENDENT_AMBULATORY_CARE_PROVIDER_SITE_OTHER): Payer: Self-pay | Admitting: *Deleted

## 2013-08-28 MED ORDER — ALPRAZOLAM 0.25 MG PO TABS: 0.2500 mg | ORAL_TABLET | Freq: Three times a day (TID) | ORAL | Status: DC | PRN

## 2013-08-28 MED ORDER — RANITIDINE HCL 150 MG PO TABS: 150.0000 mg | ORAL_TABLET | Freq: Two times a day (BID) | ORAL | Status: DC

## 2013-08-28 NOTE — Telephone Encounter (Signed)
Patient is requesting that these 2 prescriptions be filled.

## 2013-09-03 ENCOUNTER — Encounter (INDEPENDENT_AMBULATORY_CARE_PROVIDER_SITE_OTHER): Payer: Self-pay

## 2013-11-05 ENCOUNTER — Ambulatory Visit (INDEPENDENT_AMBULATORY_CARE_PROVIDER_SITE_OTHER): Payer: BC Managed Care – PPO | Admitting: Internal Medicine

## 2013-11-05 ENCOUNTER — Encounter (INDEPENDENT_AMBULATORY_CARE_PROVIDER_SITE_OTHER): Payer: Self-pay | Admitting: Internal Medicine

## 2013-11-05 VITALS — BP 88/50 | HR 76 | Temp 97.7°F | Ht 64.0 in | Wt 104.6 lb

## 2013-11-05 DIAGNOSIS — D649 Anemia, unspecified: Secondary | ICD-10-CM

## 2013-11-05 DIAGNOSIS — K219 Gastro-esophageal reflux disease without esophagitis: Secondary | ICD-10-CM

## 2013-11-05 DIAGNOSIS — F411 Generalized anxiety disorder: Secondary | ICD-10-CM

## 2013-11-05 LAB — COMPREHENSIVE METABOLIC PANEL
ALBUMIN: 4.1 g/dL (ref 3.5–5.2)
ALK PHOS: 82 U/L (ref 39–117)
ALT: 8 U/L (ref 0–35)
AST: 16 U/L (ref 0–37)
BUN: 7 mg/dL (ref 6–23)
CO2: 31 mEq/L (ref 19–32)
Calcium: 9.4 mg/dL (ref 8.4–10.5)
Chloride: 100 mEq/L (ref 96–112)
Creat: 0.65 mg/dL (ref 0.50–1.10)
Glucose, Bld: 93 mg/dL (ref 70–99)
POTASSIUM: 4.4 meq/L (ref 3.5–5.3)
SODIUM: 139 meq/L (ref 135–145)
TOTAL PROTEIN: 6.3 g/dL (ref 6.0–8.3)
Total Bilirubin: 0.4 mg/dL (ref 0.2–1.2)

## 2013-11-05 LAB — CBC WITH DIFFERENTIAL/PLATELET
BASOS ABS: 0.1 10*3/uL (ref 0.0–0.1)
Basophils Relative: 2 % — ABNORMAL HIGH (ref 0–1)
Eosinophils Absolute: 0.1 10*3/uL (ref 0.0–0.7)
Eosinophils Relative: 2 % (ref 0–5)
HEMATOCRIT: 34.6 % — AB (ref 36.0–46.0)
Hemoglobin: 11.2 g/dL — ABNORMAL LOW (ref 12.0–15.0)
LYMPHS PCT: 21 % (ref 12–46)
Lymphs Abs: 1 10*3/uL (ref 0.7–4.0)
MCH: 25.2 pg — ABNORMAL LOW (ref 26.0–34.0)
MCHC: 32.4 g/dL (ref 30.0–36.0)
MCV: 77.9 fL — ABNORMAL LOW (ref 78.0–100.0)
MONO ABS: 0.2 10*3/uL (ref 0.1–1.0)
Monocytes Relative: 4 % (ref 3–12)
NEUTROS ABS: 3.4 10*3/uL (ref 1.7–7.7)
Neutrophils Relative %: 71 % (ref 43–77)
Platelets: 233 10*3/uL (ref 150–400)
RBC: 4.44 MIL/uL (ref 3.87–5.11)
RDW: 13.8 % (ref 11.5–15.5)
WBC: 4.8 10*3/uL (ref 4.0–10.5)

## 2013-11-05 LAB — FERRITIN: Ferritin: 4 ng/mL — ABNORMAL LOW (ref 10–291)

## 2013-11-05 MED ORDER — ALPRAZOLAM 0.25 MG PO TABS: 0.2500 mg | ORAL_TABLET | Freq: Three times a day (TID) | ORAL | Status: DC | PRN

## 2013-11-05 NOTE — Progress Notes (Signed)
Subjective:     Patient ID: Lindsay Lawrence, female   DOB: Sep 16, 1955, 58 y.o.   MRN: [id number]  HPI Here today for f/u. Last seen in November of 2014. In November she underwent surgery for an enlarging nonhealing gastroduodenal ulcer by Dr. Deveron FurlongPerry Shen at [facility].  She tells me she feels pretty good. Her appetite is better. She is maintaining her weight. She has color to her face today. No dysphagia. Very slight tenderness epigastric region. Usually has 2-3 BMs daily. Occasionally see blood when she wipes. No NSAIDs    CBC    Component Value Date/Time   WBC 3.8* 05/01/2013 1257   RBC 4.06 05/01/2013 1257   HGB 11.3* 05/01/2013 1257   HCT 34.5* 05/01/2013 1257   PLT 239 05/01/2013 1257   MCV 85.0 05/01/2013 1257   MCH 27.8 05/01/2013 1257   MCHC 32.8 05/01/2013 1257   RDW 14.9 05/01/2013 1257       05/31/2013 EGD/Colonoscopy: Impression:  No evidence of erosive esophagitis, ring or stricture.  Small sliding hiatal hernia.  Five small antral ulcers 4-6 mm in diameter along with few scars.  Large semi-lunar-shaped prepyloric ulcer extending into pyloric channel. This ulcer involved 50% of circumference and at least 2 cm long. Biopsy taken from this and another ulcer and submitted together.  Bulbar erosions.  Esophagus was dilated by passing 54 French Maloney dilator but no mucosal disruption induced.  Small rectal ulcer in the background of large scar implying significant healing.  Few small diverticula and sigmoid colon.   Review of Systems     Objective:   Physical Exam  Filed Vitals:   11/05/13 1142  BP: 88/50  Pulse: 76  Temp: 97.7 F (36.5 C)  Height: 5\' 4"  (1.626 m)  Weight: 104 lb 9.6 oz (47.446 kg)  Alert and oriented. Skin warm and dry. Oral mucosa is moist.   . Sclera anicteric, conjunctivae is pink. Thyroid not enlarged. No cervical lymphadenopathy. Lungs clear. Heart regular rate and rhythm.  Abdomen is soft. Bowel sounds are positive. No hepatomegaly. No abdominal  masses felt. No tenderness.  No edema to lower extremities.        Assessment:    GERD, PUD controlled at this time with Zantac. Anemia: Last CBC, hemoglobin 11.3 in August.     Plan:    GERD controlled at this time. She seem to be doing better.  CBC,Ferritin, cmet.

## 2013-11-05 NOTE — Patient Instructions (Signed)
OV in 3 months. 

## 2013-11-15 ENCOUNTER — Telehealth (INDEPENDENT_AMBULATORY_CARE_PROVIDER_SITE_OTHER): Payer: Self-pay | Admitting: *Deleted

## 2013-11-15 NOTE — Telephone Encounter (Signed)
Patient called. Saw Terri on 11/05/13. Terri called and left message on answering machine. She called Terri back, and has heard nothing from her. Patient is asking that Dr.Rehman review her recent labs and call her. Also she states that for 2 weeks she has started having dark colored watery stools, goes to bathroom 5-6 times.

## 2013-11-15 NOTE — Telephone Encounter (Signed)
[name] please call patient with results. Chemistry panel is normal. H&H is low but not critical. She needs to be back on iron. She needs Hemoccult.

## 2013-11-16 NOTE — Telephone Encounter (Signed)
Patient called, husband given Dr.Rehman's recommendation. Hemoccult card to be mailed to the patient. Patient can take Imodium for diarrhea. Will let the patient know when labs will need to be repeated.

## 2013-11-20 ENCOUNTER — Telehealth (INDEPENDENT_AMBULATORY_CARE_PROVIDER_SITE_OTHER): Payer: Self-pay | Admitting: *Deleted

## 2013-11-20 DIAGNOSIS — D72819 Decreased white blood cell count, unspecified: Secondary | ICD-10-CM

## 2013-11-20 DIAGNOSIS — R790 Abnormal level of blood mineral: Secondary | ICD-10-CM

## 2013-11-20 DIAGNOSIS — K589 Irritable bowel syndrome without diarrhea: Secondary | ICD-10-CM

## 2013-11-20 DIAGNOSIS — K625 Hemorrhage of anus and rectum: Secondary | ICD-10-CM

## 2013-11-20 DIAGNOSIS — K279 Peptic ulcer, site unspecified, unspecified as acute or chronic, without hemorrhage or perforation: Secondary | ICD-10-CM

## 2013-11-20 DIAGNOSIS — D649 Anemia, unspecified: Secondary | ICD-10-CM

## 2013-11-20 NOTE — Telephone Encounter (Signed)
   Diagnosis:    Result(s)   Card 1: Positive:       Card 2:    Card 3:     Completed by: Larose Hiresammy Bryan Omura, LPN   HEMOCCULT SENSA DEVELOPER: LOT#:  [id number] EXPIRATION RUEA:[id number]:2016-05    HEMOCCULT SENSA CARD:  LOT#:[id number]   EXPIRATION DATE: 04/16   CARD CONTROL RESULTS:  POSITIVE: Positive NEGATIVE: Negative    ADDITIONAL COMMENTS: A message with the result was left on the patient's home voicemail.

## 2013-11-21 NOTE — Telephone Encounter (Signed)
Heme occult result note.

## 2013-11-21 NOTE — Telephone Encounter (Signed)
Hemoccult-positive stool looked like it was blood; Source is possibly a rectal ulcer. She is due for blood work in 2 weeks

## 2013-11-22 NOTE — Telephone Encounter (Signed)
Patient called and a message was left with her husband with results. Lab order done for 2 weeks.

## 2013-12-03 LAB — CBC
HCT: 34.2 % — ABNORMAL LOW (ref 36.0–46.0)
Hemoglobin: 10.8 g/dL — ABNORMAL LOW (ref 12.0–15.0)
MCH: 23.8 pg — AB (ref 26.0–34.0)
MCHC: 31.6 g/dL (ref 30.0–36.0)
MCV: 75.3 fL — ABNORMAL LOW (ref 78.0–100.0)
PLATELETS: 219 10*3/uL (ref 150–400)
RBC: 4.54 MIL/uL (ref 3.87–5.11)
RDW: 14.1 % (ref 11.5–15.5)
WBC: 4.7 10*3/uL (ref 4.0–10.5)

## 2013-12-04 LAB — FERRITIN: FERRITIN: 3 ng/mL — AB (ref 10–291)

## 2013-12-06 ENCOUNTER — Telehealth (INDEPENDENT_AMBULATORY_CARE_PROVIDER_SITE_OTHER): Payer: Self-pay | Admitting: *Deleted

## 2013-12-06 DIAGNOSIS — D649 Anemia, unspecified: Secondary | ICD-10-CM

## 2013-12-06 NOTE — Telephone Encounter (Signed)
I called to give patient results per Dr.Rehman. Spoke with patient's husband. He says that [facility] would like to see Dr.Rehman. Appointment is 02-11-14. The place he saw at the incision line(small bump) It is now as big as the end of your finger. Pt. c/o soreness. Note, I ask that the patient take Iron pills twice daily. Spouse says that she is taking Flinestones with Iron and 1 daily. Patient cannot take Iron.

## 2013-12-06 NOTE — Telephone Encounter (Signed)
Per Dr.Rehman the patient will need to have labs drawn prior to her office visit 02-11-14

## 2013-12-07 NOTE — Telephone Encounter (Signed)
Dr.Rehman recommended that the patient see the surgeon in [place]. I called spoke with patient's husband. He stated that she would not go back to [place],ask if she could see Dr.Jenkins?

## 2013-12-10 NOTE — Telephone Encounter (Signed)
Will examine if she comes with her husband on his next appointment

## 2013-12-10 NOTE — Telephone Encounter (Signed)
Patient will be made aware

## 2013-12-12 NOTE — Telephone Encounter (Signed)
Lindsay Lawrence was called and made aware.

## 2013-12-18 ENCOUNTER — Telehealth (INDEPENDENT_AMBULATORY_CARE_PROVIDER_SITE_OTHER): Payer: Self-pay | Admitting: *Deleted

## 2013-12-18 NOTE — Telephone Encounter (Signed)
Patient called and ask that we mail out to them a copy of both her and Larry's Lab work, date of service 12/03/13. She ask that they be mailed to them.

## 2013-12-18 NOTE — Telephone Encounter (Signed)
Labs printed and mailed for both [facility] Lindsay Lawrence

## 2013-12-20 ENCOUNTER — Encounter (INDEPENDENT_AMBULATORY_CARE_PROVIDER_SITE_OTHER): Payer: Self-pay | Admitting: *Deleted

## 2014-01-09 ENCOUNTER — Encounter (INDEPENDENT_AMBULATORY_CARE_PROVIDER_SITE_OTHER): Payer: Self-pay | Admitting: *Deleted

## 2014-01-09 ENCOUNTER — Other Ambulatory Visit (INDEPENDENT_AMBULATORY_CARE_PROVIDER_SITE_OTHER): Payer: Self-pay | Admitting: *Deleted

## 2014-01-09 DIAGNOSIS — D649 Anemia, unspecified: Secondary | ICD-10-CM

## 2014-01-18 ENCOUNTER — Telehealth (INDEPENDENT_AMBULATORY_CARE_PROVIDER_SITE_OTHER): Payer: Self-pay | Admitting: *Deleted

## 2014-01-18 NOTE — Telephone Encounter (Signed)
Lindsay Lawrence left a voice message. She is asking if she can use the Canasa suppository or is there something else she can use? I had advised her husband last week that it would be okay to use. Lindsay Lawrence states in the message that she has diarrhea and is hurting again. We may call house and if she is not available , we may talk with [name].

## 2014-01-19 NOTE — Telephone Encounter (Signed)
In addition to using Canasa suppositories; she needs to back off on MiraLax and senna since she is having loose stools. Please call patient.

## 2014-01-21 NOTE — Telephone Encounter (Signed)
Patient was called and given results. She states that she has stopped the Miralax and the Senna. Over the weekend she has had diarrhea 3-4 times during the day. Patient is taking Imodium only 1 time a day a few times twice a day. Appointment is 02-11-14. She states that they are giving her hours 9-6 and she really needs to keep in on a Monday. Patient was advised to take the Imodium up to 3 a day and to keep a stool diary. She was advised that Dr.Rehman would be made aware, any further recommendations we would make her aware.

## 2014-01-23 NOTE — Telephone Encounter (Signed)
I spoke with the patient. She says that the pain is lower abdomen , all across . This mainly happens when she has the diarrhea. Patient states that she will take the Bentyl as directed and will keep her upcoming appointment. If things worsen she will call our office.

## 2014-01-23 NOTE — Telephone Encounter (Signed)
She needs to take Bentyl as recommended until OV. Need to know where she is having pain and maybe she could be seen by Ms. Setzer this week

## 2014-01-23 NOTE — Telephone Encounter (Signed)
If patient is still having diarrhea she needs to go back on dicyclomine 10 mg up to 3 times a day. Please call prescription for 90 doses with 2 refills unless she has one.

## 2014-01-23 NOTE — Telephone Encounter (Signed)
Patient was called and given instruction from Dr.Rehman.   [facility] Dicyclomine 10 mg - Take 1 by mouth three times a day #90 with 2 refills.  Patient states that she has lost weight. Last week she weighed 102 lbs on her home scale. Yesterday she weighed 99 lbs. States that our scales weigh her 2-3 more lbs heavier.

## 2014-01-29 LAB — CBC
HCT: 34.6 % — ABNORMAL LOW (ref 36.0–46.0)
Hemoglobin: 10.8 g/dL — ABNORMAL LOW (ref 12.0–15.0)
MCH: 23.4 pg — ABNORMAL LOW (ref 26.0–34.0)
MCHC: 31.2 g/dL (ref 30.0–36.0)
MCV: 74.9 fL — ABNORMAL LOW (ref 78.0–100.0)
Platelets: 246 10*3/uL (ref 150–400)
RBC: 4.62 MIL/uL (ref 3.87–5.11)
RDW: 15.6 % — AB (ref 11.5–15.5)
WBC: 4.7 10*3/uL (ref 4.0–10.5)

## 2014-02-11 ENCOUNTER — Ambulatory Visit (INDEPENDENT_AMBULATORY_CARE_PROVIDER_SITE_OTHER): Payer: BC Managed Care – PPO | Admitting: Internal Medicine

## 2014-02-11 ENCOUNTER — Encounter (INDEPENDENT_AMBULATORY_CARE_PROVIDER_SITE_OTHER): Payer: Self-pay | Admitting: Internal Medicine

## 2014-02-11 VITALS — BP 94/66 | HR 74 | Temp 98.4°F | Resp 18 | Ht 64.0 in | Wt 104.1 lb

## 2014-02-11 DIAGNOSIS — K649 Unspecified hemorrhoids: Secondary | ICD-10-CM

## 2014-02-11 DIAGNOSIS — D509 Iron deficiency anemia, unspecified: Secondary | ICD-10-CM

## 2014-02-11 DIAGNOSIS — R197 Diarrhea, unspecified: Secondary | ICD-10-CM

## 2014-02-11 DIAGNOSIS — E559 Vitamin D deficiency, unspecified: Secondary | ICD-10-CM

## 2014-02-11 DIAGNOSIS — F419 Anxiety disorder, unspecified: Secondary | ICD-10-CM

## 2014-02-11 DIAGNOSIS — F411 Generalized anxiety disorder: Secondary | ICD-10-CM

## 2014-02-11 MED ORDER — DICYCLOMINE HCL 20 MG PO TABS: 20.0000 mg | ORAL_TABLET | Freq: Three times a day (TID) | ORAL | Status: DC

## 2014-02-11 MED ORDER — NYSTATIN-TRIAMCINOLONE 100000-0.1 UNIT/GM-% EX CREA: 1.0000 "application " | TOPICAL_CREAM | Freq: Two times a day (BID) | CUTANEOUS | Status: DC

## 2014-02-11 MED ORDER — ALPRAZOLAM 0.25 MG PO TABS: 0.2500 mg | ORAL_TABLET | Freq: Three times a day (TID) | ORAL | Status: DC | PRN

## 2014-02-11 MED ORDER — DICYCLOMINE HCL 20 MG PO TABS: 20.0000 mg | ORAL_TABLET | Freq: Four times a day (QID) | ORAL | Status: DC

## 2014-02-11 NOTE — Patient Instructions (Signed)
Next blood work will be in 2 months. Dicyclomine 20 mg by mouth before each meal or at least before breakfast and lunch daily. Stool diary for 8 weeks and send [place] a summary.

## 2014-02-11 NOTE — Progress Notes (Signed)
Presenting complaint;  Followup for diarrhea and iron deficiency anemia.  Subjective:  Patient is 58 year old Caucasian female who presents for scheduled visit. She was last seen on 11/05/2013 by Ms. Setzer, NP. She called office about 3 weeks ago complaining of diarrhea with urgency and cramping. She has history of IBS with diarrhea and constipation but she said diarrhea since gastric surgery for peptic ulcer disease November 2014. She was advised to go back on dicyclomine and keep stool diary. Stool diary was reviewed with patient. On most days she has 2-3 stools but frequency ranges from 1-6 stools per day. Most of her stools are semi-formed to loose. She denies melena or frank rectal bleeding. She has intermittent hematochezia usually in the form of blood on the tissue and she is using Mycolog-II cream on as needed basis and needs new prescription. She has not lost any weight since her last visit of February 2015. She did 119 pounds prior to surgery and most of the weight loss occurred in the perioperative period. She rarely experiences heartburn or regurgitation. She has intermittent nausea but does not remember having vomiting spells since surgery. She does not take NSAIDs. She also needs new prescription for dicyclomine and alprazolam. She states she has craving for ice.   Current Medications: Outpatient Encounter Prescriptions as of 02/11/2014  Medication Sig  . albuterol (PROVENTIL HFA;VENTOLIN HFA) 108 (90 BASE) MCG/ACT inhaler Inhale 2 puffs into the lungs as needed for wheezing or shortness of breath.   . ALPRAZolam (XANAX) 0.25 MG tablet Take 1 tablet (0.25 mg total) by mouth 3 (three) times daily as needed for sleep or anxiety.  . Azelastine HCl 0.15 % SOLN as needed.   . Cholecalciferol (VITAMIN D3) 5000 UNITS CAPS Take 1 capsule by mouth daily.  Marland Kitchen. dicyclomine (BENTYL) 10 MG capsule Take 10 mg by mouth 3 (three) times daily before meals.   . ferrous gluconate (FERGON) 324 MG tablet  Take 324 mg by mouth 2 (two) times daily with a meal.  . Inulin (FIBERCHOICE) 2 G CHEW Chew 2 tablets by mouth daily.   . metoprolol tartrate (LOPRESSOR) 25 MG tablet Take 0.5 tablets (12.5 mg total) by mouth daily.  . mometasone (ASMANEX 120 METERED DOSES) 220 MCG/INH inhaler Inhale 2 puffs into the lungs daily.   . Multiple Vitamin (MULTIVITAMIN WITH MINERALS) TABS tablet Take 1 tablet by mouth daily.  . nitroGLYCERIN (NITROSTAT) 0.4 MG SL tablet Place 1 tablet (0.4 mg total) under the tongue every 5 (five) minutes as needed. Up to 3 doses. If no relief after 3 rd dose, proceed to ED for evaluation  . ondansetron (ZOFRAN) 4 MG tablet Take 1 tablet (4 mg total) by mouth every 12 (twelve) hours as needed for nausea.  . polyethylene glycol (MIRALAX / GLYCOLAX) packet Take 17 g by mouth daily as needed (Constipation).  . ranitidine (ZANTAC) 150 MG tablet Take 1 tablet (150 mg total) by mouth 2 (two) times daily.  . traMADol (ULTRAM) 50 MG tablet Take 1 tablet (50 mg total) by mouth 2 (two) times daily as needed for pain.  . [DISCONTINUED] FLUVIRIN PRESERVATIVE FREE SUSP injection   . [DISCONTINUED] senna (SENOKOT) 8.6 MG TABS tablet Take 1 tablet by mouth daily as needed (Constipation).  . [DISCONTINUED] Vitamin D, Ergocalciferol, (DRISDOL) 50000 UNITS CAPS Take 50,000 Units by mouth every 7 (seven) days. Takes on Monday     Objective: Blood pressure 94/66, pulse 74, temperature 98.4 F (36.9 C), temperature source Oral, resp. rate 18, height 5'  4" (1.626 m), weight 104 lb 1.6 oz (47.219 kg). Patient is alert and in no acute distress. Conjunctiva is pink. Sclera is nonicteric Oropharyngeal mucosa is normal. No neck masses or thyromegaly noted. Cardiac exam with regular rhythm normal S1 and S2. No murmur or gallop noted. Lungs are clear to auscultation. Abdomen is symmetrical. Bowel sounds are normal. On palpation abdomen is soft with mild tenderness in both iliac fossae. No organomegaly or  masses.  No LE edema or clubbing noted.  Labs/studies Results: CBC from 01/28/2014. WBC 4.7, H&H 10.8 and 34.6, MCV 74.9 and platelet count 246K.    Assessment:  #1. diarrhea appears to be most likely secondary to IBS possibly be treated with gastric surgery over 6 months ago. She does not have alarm symptoms. She is some better with dicyclomine at low dose. #2. Iron deficiency anemia. H&H is low but stable. Now that she is not on PPI may be iron absorption and improve. #3. Chronic anxiety. She is doing well with alprazolam. #4. Hematochezia secondary to hemorrhoids and or rectal ulcer. #5. History of vitamin D deficiency. She has been on all sorts of vitamin D supplements but level has never been therapeutic. Workup has been negative for malabsorptive syndrome.   Plan:  Increase dicyclomine to 20 mg before each meal. Patient instructed to take at least 2 doses a day. Continue fiber choice 2 tablets daily which will give her 4 g of fiber. Continue Fergon one capsule by mouth twice a day. New prescription given for alprazolam oh 0.25 mg by mouth 3 times a day when necessary; 90 doses with 2 refills. Mycolog-II cream to be used on when necessary basis; new discussion given 30 g. Patient will have CBC, serum iron, TIBC, ferritin and vitamin D 2 level in 8 weeks. Office visit in 4 months.

## 2014-03-18 ENCOUNTER — Ambulatory Visit: Payer: BC Managed Care – PPO | Admitting: Cardiology

## 2014-03-28 ENCOUNTER — Encounter: Payer: Self-pay | Admitting: Cardiovascular Disease

## 2014-04-03 ENCOUNTER — Encounter (INDEPENDENT_AMBULATORY_CARE_PROVIDER_SITE_OTHER): Payer: Self-pay | Admitting: *Deleted

## 2014-04-03 ENCOUNTER — Other Ambulatory Visit (INDEPENDENT_AMBULATORY_CARE_PROVIDER_SITE_OTHER): Payer: Self-pay | Admitting: *Deleted

## 2014-04-03 DIAGNOSIS — D509 Iron deficiency anemia, unspecified: Secondary | ICD-10-CM

## 2014-04-03 DIAGNOSIS — E559 Vitamin D deficiency, unspecified: Secondary | ICD-10-CM

## 2014-04-15 ENCOUNTER — Ambulatory Visit: Payer: BC Managed Care – PPO | Admitting: Cardiovascular Disease

## 2014-04-16 LAB — CBC
HCT: 35.3 % — ABNORMAL LOW (ref 36.0–46.0)
HEMOGLOBIN: 11.1 g/dL — AB (ref 12.0–15.0)
MCH: 23.3 pg — AB (ref 26.0–34.0)
MCHC: 31.4 g/dL (ref 30.0–36.0)
MCV: 74 fL — ABNORMAL LOW (ref 78.0–100.0)
Platelets: 248 10*3/uL (ref 150–400)
RBC: 4.77 MIL/uL (ref 3.87–5.11)
RDW: 15.8 % — ABNORMAL HIGH (ref 11.5–15.5)
WBC: 5.5 10*3/uL (ref 4.0–10.5)

## 2014-04-16 LAB — IRON AND TIBC
%SAT: 8 % — ABNORMAL LOW (ref 20–55)
IRON: 41 ug/dL — AB (ref 42–145)
TIBC: 505 ug/dL — AB (ref 250–470)
UIBC: 464 ug/dL — ABNORMAL HIGH (ref 125–400)

## 2014-04-16 LAB — FERRITIN: FERRITIN: 5 ng/mL — AB (ref 10–291)

## 2014-04-16 LAB — VITAMIN D 25 HYDROXY (VIT D DEFICIENCY, FRACTURES): Vit D, 25-Hydroxy: 30 ng/mL (ref 30–89)

## 2014-04-23 ENCOUNTER — Telehealth (INDEPENDENT_AMBULATORY_CARE_PROVIDER_SITE_OTHER): Payer: Self-pay | Admitting: *Deleted

## 2014-04-23 NOTE — Telephone Encounter (Signed)
Aviela called and ask if she was to continue the stool dairy. The answer was no. She states that she wants to start back Zantac BID because she is having pain where her previous ulcers were. Patient is also experiencing fecal incontinence , 3-4 times. This is while she is asleep and while on her job.  Per Dr.Rehman patient is to take Imodium 2 mg by mouth twice a day. She is to take Metamucil 4 grams every day.  Patient has an appointment 06/17/14. Per Dr.Rehman patient will need an appointment with him at the end of this month or next month.  Patient is requesting a copy of her lab work sent to her, the labs for 04/15/14.

## 2014-04-23 NOTE — Telephone Encounter (Signed)
Labs mailed to patient.

## 2014-04-24 NOTE — Telephone Encounter (Signed)
Apt has been scheduled for 05/14/14 at 8:45 am with Dr. Karilyn Cotaehman.

## 2014-04-29 ENCOUNTER — Ambulatory Visit (INDEPENDENT_AMBULATORY_CARE_PROVIDER_SITE_OTHER): Payer: BC Managed Care – PPO | Admitting: Cardiovascular Disease

## 2014-04-29 ENCOUNTER — Encounter: Payer: Self-pay | Admitting: Cardiovascular Disease

## 2014-04-29 VITALS — BP 121/79 | HR 66 | Ht 64.0 in | Wt 107.0 lb

## 2014-04-29 DIAGNOSIS — R002 Palpitations: Secondary | ICD-10-CM

## 2014-04-29 DIAGNOSIS — R0789 Other chest pain: Secondary | ICD-10-CM

## 2014-04-29 DIAGNOSIS — I272 Pulmonary hypertension, unspecified: Secondary | ICD-10-CM

## 2014-04-29 DIAGNOSIS — E785 Hyperlipidemia, unspecified: Secondary | ICD-10-CM

## 2014-04-29 DIAGNOSIS — J45909 Unspecified asthma, uncomplicated: Secondary | ICD-10-CM

## 2014-04-29 DIAGNOSIS — I2789 Other specified pulmonary heart diseases: Secondary | ICD-10-CM

## 2014-04-29 DIAGNOSIS — J4520 Mild intermittent asthma, uncomplicated: Secondary | ICD-10-CM

## 2014-04-29 MED ORDER — METOPROLOL TARTRATE 25 MG PO TABS: 12.5000 mg | ORAL_TABLET | Freq: Two times a day (BID) | ORAL | Status: DC

## 2014-04-29 NOTE — Patient Instructions (Signed)
   Increase Metoprolol Tart to 12.5mg  twice a day  - new sent to pharm Continue all other medications.   Your physician wants you to follow up in:  1 year.  You will receive a reminder letter in the mail one-two months in advance.  If you don't receive a letter, please call our office to schedule the follow up appointment

## 2014-04-29 NOTE — Progress Notes (Signed)
Patient ID: Lindsay Lawrence, female   DOB: 07/19/1956, 58 y.o.   MRN: [id number]      SUBJECTIVE: The patient is a 58 year old woman with a history of chest discomfort but no proven coronary artery disease and a previously negative nuclear myocardial perfusion study. She also has a history of palpitations and is maintained on metoprolol. She has a history of reactive airway disease and gastroesophageal reflux disease and gastric ulcers, for which she underwent surgery at [facility] in 07/2013.. ECG performed in the office today demonstrates normal sinus rhythm, rate 63 beats per minute, with no ischemic ST segment or T-wave abnormalities.  She denies chest discomfort and shortness of breath. She does have palpitations approximately 3 times per week and feels a sensation of pounding in her head. She denies syncope but does have occasional dizziness. Her father died of a heart attack at age 64 and sustained his Lawrence MI at age 61. Her mother has high blood pressure and possibly had a heart attack in the past.  Her lipid panel from 12/24/2013 demonstrated total cholesterol 220, triglycerides 81, HDL 106, LDL 98.  Review of Systems: As per "subjective", otherwise negative.  Allergies  Allergen Reactions  . Aspirin Other (See Comments)    Ulcer, Reflux  . Codeine Nausea And Vomiting    Speeds heart up, dizziness  . Fentanyl Other (See Comments)    Unknown   . Nsaids Other (See Comments)    reflux, ulcer  . Propoxyphene N-Acetaminophen Nausea And Vomiting    Speeds heart up, dizziness    Current Outpatient Prescriptions  Medication Sig Dispense Refill  . albuterol (PROVENTIL HFA;VENTOLIN HFA) 108 (90 BASE) MCG/ACT inhaler Inhale 2 puffs into the lungs as needed for wheezing or shortness of breath.       . ALPRAZolam (XANAX) 0.25 MG tablet Take 1 tablet (0.25 mg total) by mouth 3 (three) times daily as needed for sleep or anxiety.  90 tablet  2  . Azelastine HCl 0.15 % SOLN as needed.       .  Cholecalciferol (VITAMIN D3) 5000 UNITS CAPS Take 1 capsule by mouth daily.      Marland Kitchen dicyclomine (BENTYL) 20 MG tablet Take 1 tablet (20 mg total) by mouth 3 (three) times daily before meals.  90 tablet  5  . ferrous gluconate (FERGON) 324 MG tablet Take 324 mg by mouth 2 (two) times daily with a meal.      . metoprolol tartrate (LOPRESSOR) 25 MG tablet Take 0.5 tablets (12.5 mg total) by mouth daily.  30 tablet  6  . mometasone (ASMANEX 120 METERED DOSES) 220 MCG/INH inhaler Inhale 2 puffs into the lungs daily.       . Multiple Vitamin (MULTIVITAMIN WITH MINERALS) TABS tablet Take 1 tablet by mouth daily.      . nitroGLYCERIN (NITROSTAT) 0.4 MG SL tablet Place 1 tablet (0.4 mg total) under the tongue every 5 (five) minutes as needed. Up to 3 doses. If no relief after 3 rd dose, proceed to ED for evaluation  25 tablet  3  . nystatin-triamcinolone (MYCOLOG II) cream Apply 1 application topically 2 (two) times daily.  30 g  0  . ondansetron (ZOFRAN) 4 MG tablet Take 1 tablet (4 mg total) by mouth every 12 (twelve) hours as needed for nausea.  30 tablet  1  . polyethylene glycol (MIRALAX / GLYCOLAX) packet Take 17 g by mouth daily as needed (Constipation).      . ranitidine (ZANTAC) 150 MG  tablet Take 1 tablet (150 mg total) by mouth 2 (two) times daily.  60 tablet  5  . traMADol (ULTRAM) 50 MG tablet Take 1 tablet (50 mg total) by mouth 2 (two) times daily as needed for pain.  30 tablet  2   No current facility-administered medications for this visit.    Past Medical History  Diagnosis Date  . Irritable bowel syndrome   . Gastric ulcer   . GERD (gastroesophageal reflux disease)   . Palpitations   . Ejection fraction     EF 65%, echo, April 19, 2012  . Mitral regurgitation     Mild, echo, August, 2013  . Chest pain     Nuclear, normal, 2010,  //   hospital August, 2013 no evidence of injury  . Syncope     Vasovagal  . Reactive airway disease     Pulmonary function studies from June, 2013,  show the patient does have significant response to bronchodilators.    Past Surgical History  Procedure Laterality Date  . Givens capsule study  05/03/2011    Procedure: GIVENS CAPSULE STUDY;  Surgeon: Malissa HippoNajeeb U Rehman, MD;  Location: [facility];  Service: Endoscopy;  Laterality: N/A;  7:30 am  . Colonoscopy    . Upper gastrointestinal endoscopy    . Cholecystectomy    . Abdominal hysterectomy    . Esophagogastroduodenoscopy  02/04/2012    Procedure: ESOPHAGOGASTRODUODENOSCOPY (EGD);  Surgeon: Malissa HippoNajeeb U Rehman, MD;  Location: [facility];  Service: Endoscopy;  Laterality: N/A;  320  . Esophagogastroduodenoscopy  06/02/2012    Procedure: ESOPHAGOGASTRODUODENOSCOPY (EGD);  Surgeon: Malissa HippoNajeeb U Rehman, MD;  Location: [facility];  Service: Endoscopy;  Laterality: N/A;  1050  . Flexible sigmoidoscopy N/A 05/31/2013    Procedure: FLEXIBLE SIGMOIDOSCOPY;  Surgeon: Malissa HippoNajeeb U Rehman, MD;  Location: [facility];  Service: Endoscopy;  Laterality: N/A;  250  . Esophagogastroduodenoscopy (egd) with esophageal dilation N/A 05/31/2013    Procedure: ESOPHAGOGASTRODUODENOSCOPY (EGD) WITH ESOPHAGEAL DILATION;  Surgeon: Malissa HippoNajeeb U Rehman, MD;  Location: [facility];  Service: Endoscopy;  Laterality: N/A;  . Stomach surgery      History   Social History  . Marital Status: Married    Spouse Name: N/A    Number of Children: N/A  . Years of Education: N/A   Occupational History  . Not on file.   Social History Main Topics  . Smoking status: Never Smoker   . Smokeless tobacco: Never Used  . Alcohol Use: No  . Drug Use: No  . Sexual Activity: Not on file   Other Topics Concern  . Not on file   Social History Narrative  . No narrative on file     Filed Vitals:   04/29/14 0857  BP: 121/79  Pulse: 66  Height: 5\' 4"  (1.626 m)  Weight: 107 lb (48.535 kg)  SpO2: 100%    PHYSICAL EXAM General: NAD Neck: No JVD, no thyromegaly. Lungs: Clear to auscultation bilaterally with normal  respiratory effort. CV: Nondisplaced PMI.  Regular rate and rhythm, normal S1/S2, no S3/S4, no murmur. No pretibial or periankle edema.  No carotid bruit.  Normal pedal pulses.  Abdomen: Soft, nontender, no hepatosplenomegaly, no distention.  Neurologic: Alert and oriented x 3.  Psych: Normal affect. Extremities: No clubbing or cyanosis.   ECG: reviewed and available in electronic records.      ASSESSMENT AND PLAN: 1. Chest discomfort: No recurrences. No indication for testing at this time. Elevated HDL is  cardioprotective.  2. Palpitations: Occurring three times per week with increased symptoms from 1 year ago. Will increase metoprolol to 12.5 mg bid (had previously been on 25 mg daily in the past). I have asked her to call back in one month and inform me if this has helped to alleviate her symptoms. No indication for event monitor at this time.  3. Elevated cholesterol: This is primarily a function of elevated HDL which is cardioprotective. She does not have a risk factor profile which would necessitate statin therapy.  Dispo: f/u 1 year.  Prentice Docker, M.D., F.A.C.C.

## 2014-04-30 ENCOUNTER — Telehealth: Payer: Self-pay | Admitting: *Deleted

## 2014-04-30 NOTE — Telephone Encounter (Signed)
Can refill nitro. No indication for statin, which was explained to the patient in detail at office visit.

## 2014-04-30 NOTE — Telephone Encounter (Signed)
Message left on voice mail - wants to know if she can have refill on her Nitroglycerin, said Dr. Kirtland BouchardK told her she didn't need anymore.  Feels more comfortable having "just in case".  Also question if she needs to be on cholesterol medication.     Noted in Dr. Purvis SheffieldKoneswaran office note - Elevated cholesterol: This is primarily a function of elevated HDL which is cardioprotective. She does not have a risk factor profile which would necessitate statin therapy.  Will notify her of this.    Please advise on NTG refill.

## 2014-05-02 ENCOUNTER — Other Ambulatory Visit: Payer: Self-pay | Admitting: *Deleted

## 2014-05-02 MED ORDER — NITROGLYCERIN 0.4 MG SL SUBL: 0.4000 mg | SUBLINGUAL_TABLET | SUBLINGUAL | Status: DC | PRN

## 2014-05-02 NOTE — Telephone Encounter (Signed)
Info relayed to pt. Nitro sent to pharmacy.

## 2014-05-02 NOTE — Telephone Encounter (Signed)
Patient has called back inquiring about her original message from 04/30/14

## 2014-05-02 NOTE — Telephone Encounter (Signed)
Left message to return call 

## 2014-05-14 ENCOUNTER — Encounter (INDEPENDENT_AMBULATORY_CARE_PROVIDER_SITE_OTHER): Payer: Self-pay | Admitting: Internal Medicine

## 2014-05-14 ENCOUNTER — Ambulatory Visit (INDEPENDENT_AMBULATORY_CARE_PROVIDER_SITE_OTHER): Payer: BC Managed Care – PPO | Admitting: Internal Medicine

## 2014-05-14 VITALS — BP 100/68 | HR 72 | Temp 98.0°F | Resp 18 | Ht 64.0 in | Wt 108.0 lb

## 2014-05-14 DIAGNOSIS — R197 Diarrhea, unspecified: Secondary | ICD-10-CM

## 2014-05-14 DIAGNOSIS — F411 Generalized anxiety disorder: Secondary | ICD-10-CM

## 2014-05-14 DIAGNOSIS — R112 Nausea with vomiting, unspecified: Secondary | ICD-10-CM

## 2014-05-14 DIAGNOSIS — D509 Iron deficiency anemia, unspecified: Secondary | ICD-10-CM

## 2014-05-14 MED ORDER — ONDANSETRON HCL 4 MG PO TABS: 4.0000 mg | ORAL_TABLET | Freq: Two times a day (BID) | ORAL | Status: DC | PRN

## 2014-05-14 MED ORDER — PROMETHAZINE HCL 25 MG RE SUPP: 25.0000 mg | RECTAL | Status: DC | PRN

## 2014-05-14 MED ORDER — ALPRAZOLAM 0.25 MG PO TABS: 0.2500 mg | ORAL_TABLET | Freq: Three times a day (TID) | ORAL | Status: DC | PRN

## 2014-05-14 MED ORDER — LOPERAMIDE HCL 2 MG PO CAPS
2.0000 mg | ORAL_CAPSULE | Freq: Every day | ORAL | Status: DC
Start: 2014-05-14 — End: 2014-06-17

## 2014-05-14 NOTE — Progress Notes (Signed)
Presenting complaint;  Diarrhea and abdominal pain.  Subjective:  Patient is 58 year old Caucasian female who presents for scheduled visit accompanied by her husband Peyton Najjar. She was last seen 3 months ago for diarrhea. She was felt to have IBS and/or diarrhea secondary to gastric surgery that she had in November last year. She doubled up on dicyclomine. She states she is not any better. In fact she is worse. About 2 weeks ago she had an episode where she had multiple bowel movements during the night and a day later she had nausea and vomiting. She did not have fever. She also had excruciating pain across her lower abdomen. She states she almost decided to go to emergency room. She continues to have pain but has not intense like it was before. Vomiting has subsided but she still has nausea. She is having at least 3-4 stools per day. She has an occasional nocturnal bowel movement. Every now and then she sees blood on the tissue. She has not experienced frank rectal bleeding or melena. She denies recent antibiotic use. She says her appetite is fair to normal. She continues to have epigastric pain. She feels heartburn is well controlled with ranitidine. She has gained 4 pounds in the last 3 months. She requests refill for alprazolam promethazine suppositories and ondansetron. She continues to have intermittent palpitations and was seen by Dr. Purvis Sheffield and metoprolol dose was increased.  Current Medications: Outpatient Encounter Prescriptions as of 05/14/2014  Medication Sig  . albuterol (PROVENTIL HFA;VENTOLIN HFA) 108 (90 BASE) MCG/ACT inhaler Inhale 2 puffs into the lungs as needed for wheezing or shortness of breath.   . ALPRAZolam (XANAX) 0.25 MG tablet Take 1 tablet (0.25 mg total) by mouth 3 (three) times daily as needed for sleep or anxiety.  . Azelastine HCl 0.15 % SOLN as needed.   . Cholecalciferol (VITAMIN D3) 5000 UNITS CAPS Take 1 capsule by mouth daily.  Marland Kitchen dicyclomine (BENTYL) 20 MG tablet  Take 1 tablet (20 mg total) by mouth 3 (three) times daily before meals.  . ferrous gluconate (FERGON) 324 MG tablet Take 324 mg by mouth 2 (two) times daily with a meal.  . metoprolol tartrate (LOPRESSOR) 25 MG tablet Take 0.5 tablets (12.5 mg total) by mouth 2 (two) times daily.  . mometasone (ASMANEX 120 METERED DOSES) 220 MCG/INH inhaler Inhale 2 puffs into the lungs daily.   . Multiple Vitamin (MULTIVITAMIN WITH MINERALS) TABS tablet Take 1 tablet by mouth daily.  . nitroGLYCERIN (NITROSTAT) 0.4 MG SL tablet Place 1 tablet (0.4 mg total) under the tongue every 5 (five) minutes as needed. Up to 3 doses. If no relief after 3 rd dose, proceed to ED for evaluation  . ondansetron (ZOFRAN) 4 MG tablet Take 1 tablet (4 mg total) by mouth every 12 (twelve) hours as needed for nausea.  . polyethylene glycol (MIRALAX / GLYCOLAX) packet Take 17 g by mouth daily as needed (Constipation).  . promethazine (PHENERGAN) 25 MG suppository Place 25 mg rectally as needed for nausea or vomiting.  . ranitidine (ZANTAC) 150 MG tablet Take 1 tablet (150 mg total) by mouth 2 (two) times daily.  . traMADol (ULTRAM) 50 MG tablet Take 1 tablet (50 mg total) by mouth 2 (two) times daily as needed for pain.  . [DISCONTINUED] nystatin-triamcinolone (MYCOLOG II) cream Apply 1 application topically 2 (two) times daily.     Objective: Blood pressure 100/68, pulse 72, temperature 98 F (36.7 C), temperature source Oral, resp. rate 18, height  (1.626 m),  weight 108 lb (48.988 kg). Patient is alert and in no acute distress Conjunctiva is pink. Sclera is nonicteric Oropharyngeal mucosa is normal. No neck masses or thyromegaly noted. Cardiac exam with regular rhythm normal S1 and S2. No murmur or gallop noted. Lungs are clear to auscultation. Abdomen is symmetrical. Bowel sounds are normal. On palpation it is soft with tenderness in midepigastrium as well as across lower abdomen. No guarding or organomegaly.  No LE  edema or clubbing noted.  Labs/studies Results: Lab data from 04/15/2014  WBC 5.5, H&H 11.1 and 35.3, MCV 74 and platelet count 248K. Serum iron 41, TIBC 505 and saturation 8% Serum ferritin 5. Vitamin D2 level  30.   Assessment:  #1. Diarrhea unresponsive to antispasmodic therapy. She is also having lower abdominal pain. Infection needs to be ruled out before sigmoidoscopy considered. #2. Epigastric pain. She has chronic epigastric pain with waxing and waning course. She had surgery for peptic ulcer disease in November last year and still having pain. #3. Iron deficiency anemia. She has not reached the point that she will need parenteral iron. #4. Vitamin D deficiency. This is the first time that are widened and the level has been normal in the last four years.   Plan:  Imodium OTC 2 mg by mouth every morning. GI pathogen panel. New prescription given for alprazolam, ondansetron and promethazine suppositories. Patient will keep stool diary until office visit next month.

## 2014-05-14 NOTE — Patient Instructions (Signed)
Physician will call with results of stool test. Keep stool diary until office visit next month

## 2014-05-17 LAB — GASTROINTESTINAL PATHOGEN PANEL PCR
C. DIFFICILE TOX A/B, PCR: NEGATIVE
Campylobacter, PCR: NEGATIVE
Cryptosporidium, PCR: NEGATIVE
E coli (ETEC) LT/ST PCR: NEGATIVE
E coli (STEC) stx1/stx2, PCR: NEGATIVE
E coli 0157, PCR: NEGATIVE
GIARDIA LAMBLIA, PCR: NEGATIVE
NOROVIRUS, PCR: NEGATIVE
ROTAVIRUS, PCR: NEGATIVE
SALMONELLA, PCR: NEGATIVE
SHIGELLA, PCR: NEGATIVE

## 2014-05-21 ENCOUNTER — Encounter (INDEPENDENT_AMBULATORY_CARE_PROVIDER_SITE_OTHER): Payer: Self-pay | Admitting: *Deleted

## 2014-05-21 ENCOUNTER — Other Ambulatory Visit (INDEPENDENT_AMBULATORY_CARE_PROVIDER_SITE_OTHER): Payer: Self-pay | Admitting: *Deleted

## 2014-05-21 DIAGNOSIS — R197 Diarrhea, unspecified: Secondary | ICD-10-CM

## 2014-05-21 DIAGNOSIS — R1013 Epigastric pain: Secondary | ICD-10-CM

## 2014-05-22 ENCOUNTER — Encounter (INDEPENDENT_AMBULATORY_CARE_PROVIDER_SITE_OTHER): Payer: Self-pay | Admitting: *Deleted

## 2014-05-24 ENCOUNTER — Encounter (HOSPITAL_COMMUNITY): Payer: Self-pay | Admitting: Pharmacy Technician

## 2014-06-07 ENCOUNTER — Ambulatory Visit (HOSPITAL_COMMUNITY)
Admission: RE | Admit: 2014-06-07 | Discharge: 2014-06-07 | Disposition: A | Discharge status: Home / Self Care | Payer: BC Managed Care – PPO | Source: Ambulatory Visit | Attending: Internal Medicine | Admitting: Internal Medicine

## 2014-06-07 ENCOUNTER — Encounter (HOSPITAL_COMMUNITY)
Admission: RE | Disposition: A | Discharge status: Home / Self Care | Payer: Self-pay | Source: Ambulatory Visit | Attending: Internal Medicine

## 2014-06-07 ENCOUNTER — Encounter (HOSPITAL_COMMUNITY): Payer: Self-pay | Admitting: *Deleted

## 2014-06-07 DIAGNOSIS — R112 Nausea with vomiting, unspecified: Secondary | ICD-10-CM | POA: Insufficient documentation

## 2014-06-07 DIAGNOSIS — R197 Diarrhea, unspecified: Secondary | ICD-10-CM | POA: Diagnosis not present

## 2014-06-07 DIAGNOSIS — R1013 Epigastric pain: Secondary | ICD-10-CM | POA: Diagnosis not present

## 2014-06-07 DIAGNOSIS — Z980 Intestinal bypass and anastomosis status: Secondary | ICD-10-CM | POA: Diagnosis not present

## 2014-06-07 DIAGNOSIS — R1310 Dysphagia, unspecified: Secondary | ICD-10-CM

## 2014-06-07 HISTORY — PX: ESOPHAGOGASTRODUODENOSCOPY: SHX5428

## 2014-06-07 HISTORY — PX: ESOPHAGEAL DILATION: SHX303

## 2014-06-07 HISTORY — PX: FLEXIBLE SIGMOIDOSCOPY: SHX5431

## 2014-06-07 SURGERY — EGD (ESOPHAGOGASTRODUODENOSCOPY)
Anesthesia: Moderate Sedation

## 2014-06-07 MED ORDER — BUTAMBEN-TETRACAINE-BENZOCAINE 2-2-14 % EX AERO
INHALATION_SPRAY | CUTANEOUS | Status: DC | PRN
  Administered 2014-06-07: 2 via TOPICAL

## 2014-06-07 MED ORDER — MIDAZOLAM HCL 5 MG/5ML IJ SOLN
INTRAMUSCULAR | Status: DC | PRN
  Administered 2014-06-07 (×4): 2 mg via INTRAVENOUS

## 2014-06-07 MED ORDER — MEPERIDINE HCL 25 MG/ML IJ SOLN
INTRAMUSCULAR | Status: DC | PRN
  Administered 2014-06-07 (×2): 25 mg via INTRAVENOUS

## 2014-06-07 MED ORDER — STERILE WATER FOR IRRIGATION IR SOLN
Status: DC | PRN
  Administered 2014-06-07: 09:00:00

## 2014-06-07 MED ORDER — MIDAZOLAM HCL 5 MG/5ML IJ SOLN
INTRAMUSCULAR | Status: AC
  Filled 2014-06-07: qty 10

## 2014-06-07 MED ORDER — MEPERIDINE HCL 50 MG/ML IJ SOLN
INTRAMUSCULAR | Status: AC
  Filled 2014-06-07: qty 1

## 2014-06-07 MED ORDER — SODIUM CHLORIDE 0.9 % IV SOLN
INTRAVENOUS | Status: DC
  Administered 2014-06-07: 08:00:00 via INTRAVENOUS

## 2014-06-07 NOTE — H&P (Signed)
Lindsay L MooreBaades an 58 y.o. female.   Chief Complaint: Patient is here for EGD, ED and flexible sigmoidoscopy. HPI: Patient is 58 year old Caucasian female with complicated GI history including history of gastric ulcer for which she underwent surgery November 2014 presents with recurrent epigastric pain daily nausea and sporadic vomiting. She also complains of solid food dysphagia. She's also having diarrhea unresponsive to therapy. Stool studies were negative.  Past Medical History  Diagnosis Date  . Irritable bowel syndrome   . Gastric ulcer   . GERD (gastroesophageal reflux disease)   . Palpitations   . Ejection fraction     EF 65%, echo, April 19, 2012  . Mitral regurgitation     Mild, echo, August, 2013  . Chest pain     Nuclear, normal, 2010,  //   hospital August, 2013 no evidence of injury  . Syncope     Vasovagal  . Reactive airway disease     Pulmonary function studies from June, 2013, show the patient does have significant response to bronchodilators.    Past Surgical History  Procedure Laterality Date  . Givens capsule study  05/03/2011    Procedure: GIVENS CAPSULE STUDY;  Surgeon: Malissa Hippo, MD;  Location: [facility];  Service: Endoscopy;  Laterality: N/A;  7:30 am  . Colonoscopy    . Upper gastrointestinal endoscopy    . Cholecystectomy    . Abdominal hysterectomy    . Esophagogastroduodenoscopy  02/04/2012    Procedure: ESOPHAGOGASTRODUODENOSCOPY (EGD);  Surgeon: Malissa Hippo, MD;  Location: [facility];  Service: Endoscopy;  Laterality: N/A;  320  . Esophagogastroduodenoscopy  06/02/2012    Procedure: ESOPHAGOGASTRODUODENOSCOPY (EGD);  Surgeon: Malissa Hippo, MD;  Location: [facility];  Service: Endoscopy;  Laterality: N/A;  1050  . Flexible sigmoidoscopy N/A 05/31/2013    Procedure: FLEXIBLE SIGMOIDOSCOPY;  Surgeon: Malissa Hippo, MD;  Location: [facility];  Service: Endoscopy;  Laterality: N/A;  250  . Esophagogastroduodenoscopy (egd) with  esophageal dilation N/A 05/31/2013    Procedure: ESOPHAGOGASTRODUODENOSCOPY (EGD) WITH ESOPHAGEAL DILATION;  Surgeon: Malissa Hippo, MD;  Location: [facility];  Service: Endoscopy;  Laterality: N/A;  . Stomach surgery      Family History  Problem Relation Age of Onset  . Healthy Mother   . Heart attack Father   . Healthy Sister    Social History:  reports that she has never smoked. She has never used smokeless tobacco. She reports that she does not drink alcohol or use illicit drugs.  Allergies:  Allergies  Allergen Reactions  . Aspirin Other (See Comments)    Ulcer, Reflux  . Codeine Nausea And Vomiting    Speeds heart up, dizziness  . Fentanyl Other (See Comments)    Unknown   . Nsaids Other (See Comments)    reflux, ulcer  . Propoxyphene N-Acetaminophen Nausea And Vomiting    Speeds heart up, dizziness    Medications Prior to Admission  Medication Sig Dispense Refill  . ALPRAZolam (XANAX) 0.25 MG tablet Take 1 tablet (0.25 mg total) by mouth 3 (three) times daily as needed for sleep or anxiety.  90 tablet  2  . Cholecalciferol (VITAMIN D3) 5000 UNITS CAPS Take 1 capsule by mouth daily.      . cholestyramine (QUESTRAN) 4 G packet Take 4 g by mouth daily.      Marland Kitchen dicyclomine (BENTYL) 20 MG tablet Take 1 tablet (20 mg total) by mouth 3 (three) times daily before  meals.  90 tablet  5  . loperamide (IMODIUM) 2 MG capsule Take 1 capsule (2 mg total) by mouth daily before breakfast.  30 capsule  0  . metoprolol tartrate (LOPRESSOR) 25 MG tablet Take 0.5 tablets (12.5 mg total) by mouth 2 (two) times daily.  30 tablet  11  . Multiple Vitamin (MULTIVITAMIN WITH MINERALS) TABS tablet Take 1 tablet by mouth daily.      . ondansetron (ZOFRAN) 4 MG tablet Take 1 tablet (4 mg total) by mouth every 12 (twelve) hours as needed for nausea.  30 tablet  0  . polyethylene glycol (MIRALAX / GLYCOLAX) packet Take 17 g by mouth daily as needed (Constipation).      . ranitidine (ZANTAC) 150 MG  tablet Take 1 tablet (150 mg total) by mouth 2 (two) times daily.  60 tablet  5  . albuterol (PROVENTIL HFA;VENTOLIN HFA) 108 (90 BASE) MCG/ACT inhaler Inhale 2 puffs into the lungs as needed for wheezing or shortness of breath.       . Azelastine HCl 0.15 % SOLN Place 1 spray into the nose as needed.       . ferrous gluconate (FERGON) 324 MG tablet Take 324 mg by mouth 2 (two) times daily with a meal.      . mometasone (ASMANEX 120 METERED DOSES) 220 MCG/INH inhaler Inhale 2 puffs into the lungs daily as needed.       . nitroGLYCERIN (NITROSTAT) 0.4 MG SL tablet Place 1 tablet (0.4 mg total) under the tongue every 5 (five) minutes as needed. Up to 3 doses. If no relief after 3 rd dose, proceed to ED for evaluation  25 tablet  3  . traMADol (ULTRAM) 50 MG tablet Take 1 tablet (50 mg total) by mouth 2 (two) times daily as needed for pain.  30 tablet  2    No results found for this or any previous visit (from the past 48 hour(s)). No results found.  ROS  Blood pressure 135/84, pulse 65, temperature 98 F (36.7 C), temperature source Oral, resp. rate 18, height  (1.626 m), weight 108 lb (48.988 kg), SpO2 100.00%. Physical Exam  Constitutional:  Well-developed thin Caucasian female in NAD  HENT:  Mouth/Throat: Oropharynx is clear and moist.  Eyes: Conjunctivae are normal. No scleral icterus.  Neck: No thyromegaly present.  Cardiovascular: Normal rate, regular rhythm and normal heart sounds.   No murmur heard. Respiratory: Effort normal and breath sounds normal.  GI: Soft. Tenderness: mild tenderness epigastrium and left lower quadrant of abdomen.  Musculoskeletal: She exhibits no edema.  Lymphadenopathy:    She has no cervical adenopathy.  Neurological: She is alert.  Skin: Skin is warm and dry.     Assessment/Plan Epigastric pain nausea and vomiting. History of peptic ulcer disease status post surgery November 2014. Diarrhea negative stool studies. EGD with ED and flexible  sigmoidoscopy.  Aldan Camey U 06/07/2014, 8:52 AM

## 2014-06-07 NOTE — Op Note (Signed)
EGD AND FLEXIBLE SIGMOIDOSCOPY PROCEDURE REPORT  PATIENT:  Lindsay Lawrence  MR#:  [id number] Birthdate:  Jul 05, 1956, 58 y.o., female Endoscopist:  Dr. Malissa Hippo, MD Referred By:  Dr. Ignatius Specking, MD Procedure Date: 06/07/2014  Procedure:   EGD, ED & flexible sigmoidoscopy.  Indications:  Patient is 58 year old Caucasian female with complicated GI history including history of peptic ulcer disease for which she underwent surgery November 2014 who presents with recurrent epigastric pain nausea and vomiting. Also complains of dysphagia to solids. She has diarrhea of several weeks duration and stool studies were negative.            Informed Consent:  The risks, benefits, alternatives & imponderables which include, but are not limited to, bleeding, infection, perforation, drug reaction and potential missed lesion have been reviewed.  The potential for biopsy, lesion removal, esophageal dilation, etc. have also been discussed.  Questions have been answered.  All parties agreeable.  Please see history & physical in medical record for more information.  Medications:  Demerol 50 mg IV Versed 8 mg IV Cetacaine spray topically for oropharyngeal anesthesia  EGD  Description of procedure:  The endoscope was introduced through the mouth and advanced to the second portion of the duodenum without difficulty or limitations. The mucosal surfaces were surveyed very carefully during advancement of the scope and upon withdrawal.  Findings:  Esophagus:  Mucosa of the esophagus was normal. GE junction was unremarkable without ring or stricture formation. GEJ: 40 cm \\Stomach :  Moderate amount of food debris noted in the stomach. Examination was therefore somewhat limited. Gastrojejunostomy was patent without ulceration or erosions. Gastric mucosa fundus and cardia was normal. Duodenum:  Normal duodenal mucosa.  Therapeutic/Diagnostic Maneuvers Performed:   Esophagus dilated by passing 54 French Maloney  dilator to full insertion.  FLEXIBLE SIGMOIDOSCOPY: Description of procedure:  After a digital rectal exam was performed, that colonoscope was advanced from the anus through the rectum and colon to splenic flexure. Scope was slowly and cautiously withdrawn. The mucosal surfaces were carefully surveyed utilizing scope tip to flexion to facilitate fold flattening as needed. The scope was pulled down into the rectum where a thorough exam including retroflexion was performed.  Findings:   Prep excellent. Mucosa of descending sigmoid colon and rectum was normal. Previously identified rectal ulcer had completely healed. Anorectal junction without abnormality.  Therapeutic/Diagnostic Maneuvers Performed:   Random biopsies taken from mucosa of distal sigmoid colon for routine histology.  Complications:  None   Impression:  No evidence of erosive esophagitis ring or stricture formation. Esophagus dilated by passing 54 French Maloney dilator but no mucosal disruption noted. Patent Billroth I anastomosis without anastomotic ulceration. Moderate amount of food debris in the stomach most likely due to gastroparesis.  Recommendations:  Standard instructions given. I will contact patient with biopsy results. Solid phase gastric emptying study to be scheduled.  REHMAN,NAJEEB U  06/07/2014 9:29 AM  CC: Dr. Ignatius Specking., MD & Dr. Bonnetta Barry ref. provider found

## 2014-06-07 NOTE — Discharge Instructions (Signed)
Resume usual medications and diet. No driving for 24 hours. Physician will call with biopsy results.  Flexible Sigmoidoscopy, Care After Refer to this sheet in the next few weeks. These instructions provide you with information on caring for yourself after your procedure. Your health care provider may also give you more specific instructions. Your treatment has been planned according to current medical practices, but problems sometimes occur. Call your health care provider if you have any problems or questions after your procedure. WHAT TO EXPECT AFTER THE PROCEDURE After your procedure, it is typical to have the following:   Abdominal cramps.  Bloating.  A small amount of rectal bleeding if you had a biopsy. HOME CARE INSTRUCTIONS  Only take over-the-counter or prescription medicines for pain, fever, or discomfort as directed by your health care provider.  Resume your normal diet and activities as directed by your health care provider. SEEK MEDICAL CARE IF:  You have abdominal pain or cramping that lasts longer than 1 hour after the procedure.  You continue to have small amounts of rectal bleeding after 24 hours.  You have nausea or vomiting.  You feel weak or dizzy. SEEK IMMEDIATE MEDICAL CARE IF:   You have a fever.  You pass large blood clots or see a large amount of blood in the toilet after having a bowel movement. This may also occur 10-14 days after the procedure. It is more likely if you had a biopsy.  You develop abdominal pain that is not relieved with medicine or your abdominal pain gets worse.  You have nausea or vomiting for more than 24 hours after the procedure. Document Released: 09/04/2013 Document Reviewed: 09/04/2013 [facility] Patient Information 2015 [place], [place]. This information is not intended to replace advice given to you by your health care provider. Make sure you discuss any questions you have with your health care  provider.  Esophagogastroduodenoscopy Care After Refer to this sheet in the next few weeks. These instructions provide you with information on caring for yourself after your procedure. Your caregiver may also give you more specific instructions. Your treatment has been planned according to current medical practices, but problems sometimes occur. Call your caregiver if you have any problems or questions after your procedure.  HOME CARE INSTRUCTIONS  Do not eat or drink anything until the numbing medicine (local anesthetic) has worn off and your gag reflex has returned. You will know that the local anesthetic has worn off when you can swallow comfortably.  Do not drive for 12 hours after the procedure or as directed by your caregiver.  Only take medicines as directed by your caregiver. SEEK MEDICAL CARE IF:   You cannot stop coughing.  You are not urinating at all or less than usual. SEEK IMMEDIATE MEDICAL CARE IF:  You have difficulty swallowing.  You cannot eat or drink.  You have worsening throat or chest pain.  You have dizziness, lightheadedness, or you faint.  You have nausea or vomiting.  You have chills.  You have a fever.  You have severe abdominal pain.  You have black, tarry, or bloody stools. Document Released: 08/16/2012 Document Reviewed: 08/16/2012 [facility] Patient Information 2015 [place], [place]. This information is not intended to replace advice given to you by your health care provider. Make sure you discuss any questions you have with your health care provider.

## 2014-06-12 ENCOUNTER — Encounter (HOSPITAL_COMMUNITY): Payer: Self-pay | Admitting: Internal Medicine

## 2014-06-17 ENCOUNTER — Encounter (INDEPENDENT_AMBULATORY_CARE_PROVIDER_SITE_OTHER): Payer: Self-pay | Admitting: Internal Medicine

## 2014-06-17 ENCOUNTER — Ambulatory Visit (INDEPENDENT_AMBULATORY_CARE_PROVIDER_SITE_OTHER): Payer: BC Managed Care – PPO | Admitting: Internal Medicine

## 2014-06-17 VITALS — BP 100/66 | HR 72 | Temp 98.2°F | Resp 18 | Ht 64.0 in | Wt 107.9 lb

## 2014-06-17 DIAGNOSIS — D509 Iron deficiency anemia, unspecified: Secondary | ICD-10-CM

## 2014-06-17 DIAGNOSIS — R112 Nausea with vomiting, unspecified: Secondary | ICD-10-CM

## 2014-06-17 DIAGNOSIS — R1084 Generalized abdominal pain: Secondary | ICD-10-CM

## 2014-06-17 DIAGNOSIS — R197 Diarrhea, unspecified: Secondary | ICD-10-CM

## 2014-06-17 LAB — CBC
HEMATOCRIT: 34 % — AB (ref 36.0–46.0)
Hemoglobin: 11.1 g/dL — ABNORMAL LOW (ref 12.0–15.0)
MCH: 24.1 pg — ABNORMAL LOW (ref 26.0–34.0)
MCHC: 32.6 g/dL (ref 30.0–36.0)
MCV: 73.8 fL — AB (ref 78.0–100.0)
Platelets: 238 10*3/uL (ref 150–400)
RBC: 4.61 MIL/uL (ref 3.87–5.11)
RDW: 16.2 % — ABNORMAL HIGH (ref 11.5–15.5)
WBC: 4.6 10*3/uL (ref 4.0–10.5)

## 2014-06-17 LAB — TSH: TSH: 1.006 u[IU]/mL (ref 0.350–4.500)

## 2014-06-17 LAB — FERRITIN: Ferritin: 6 ng/mL — ABNORMAL LOW (ref 10–291)

## 2014-06-17 MED ORDER — DICYCLOMINE HCL 20 MG PO TABS: 10.0000 mg | ORAL_TABLET | Freq: Three times a day (TID) | ORAL | Status: DC | PRN

## 2014-06-17 MED ORDER — DIPHENOXYLATE-ATROPINE 2.5-0.025 MG PO TABS: 1.0000 | ORAL_TABLET | Freq: Two times a day (BID) | ORAL | Status: DC

## 2014-06-17 NOTE — Progress Notes (Signed)
Presenting complaint;  Followup for for nausea vomiting abdominal pain and diarrhea.  Database;  Patient is 58 year old Caucasian female who presents for reevaluation of her multiple GI symptoms. She's been having diarrhea for several months. She was felt to have diarrhea due to IBS she did not have stones therapy. She had GI pathogen panel about 5 weeks ago and was negative. She also has been complaining of dysphagia epigastric pain nausea and vomiting. She underwent EGD, ED and flexible sigmoidoscopy on 06/07/2014.  EGD revealed normal mucosa of the esophagus without drain or stricture formation. She had moderate amount of food debris in the stomach with patent Billroth I anastomosis. Flexible sigmoidoscopy of the mucosa descending and sigmoid colon. Previously identified rectal ulcer had completely healed. Random biopsies were taken from mucosa of sigmoid colon and these were negative for microscopic or collagenous colitis. Patient was advised to take loperamide on schedule and keep stool diary.  Subjective;  Patient brings along stool diarrhea that she has kept over the last 2 weeks. She is having anywhere from 2-6 stools per day. Most of her stools or loose but every now and then she has semi-formed stool. She has occasional nocturnal bowel movement. She continues to complain of lower abdominal pain. She remains with daily nausea worse after meals but only had one episode of vomiting in the last 2 weeks. She denies melena rectal bleeding fever or chills. She has not lost any weight since her last visit. She says her swallowing has improved since esophageal dilation.   Current Medications: Outpatient Encounter Prescriptions as of 06/17/2014  Medication Sig  . albuterol (PROVENTIL HFA;VENTOLIN HFA) 108 (90 BASE) MCG/ACT inhaler Inhale 2 puffs into the lungs as needed for wheezing or shortness of breath.   . ALPRAZolam (XANAX) 0.25 MG tablet Take 1 tablet (0.25 mg total) by mouth 3 (three)  times daily as needed for sleep or anxiety.  . Azelastine HCl 0.15 % SOLN Place 1 spray into the nose as needed.   . Cholecalciferol (VITAMIN D3) 5000 UNITS CAPS Take 1 capsule by mouth daily.  . cholestyramine (QUESTRAN) 4 G packet Take 4 g by mouth daily.  Marland Kitchen dicyclomine (BENTYL) 20 MG tablet Take 1 tablet (20 mg total) by mouth 3 (three) times daily before meals.  Marland Kitchen loperamide (IMODIUM) 2 MG capsule Take 1 capsule (2 mg total) by mouth daily before breakfast.  . metoprolol tartrate (LOPRESSOR) 25 MG tablet Take 0.5 tablets (12.5 mg total) by mouth 2 (two) times daily.  . mometasone (ASMANEX 120 METERED DOSES) 220 MCG/INH inhaler Inhale 2 puffs into the lungs daily as needed.   . Multiple Vitamin (MULTIVITAMIN WITH MINERALS) TABS tablet Take 1 tablet by mouth daily.  . nitroGLYCERIN (NITROSTAT) 0.4 MG SL tablet Place 1 tablet (0.4 mg total) under the tongue every 5 (five) minutes as needed. Up to 3 doses. If no relief after 3 rd dose, proceed to ED for evaluation  . ondansetron (ZOFRAN) 4 MG tablet Take 1 tablet (4 mg total) by mouth every 12 (twelve) hours as needed for nausea.  Marland Kitchen PHENADOZ 25 MG suppository Place 25 mg rectally as needed.   . polyethylene glycol (MIRALAX / GLYCOLAX) packet Take 17 g by mouth daily as needed (Constipation).  . ranitidine (ZANTAC) 150 MG tablet Take 1 tablet (150 mg total) by mouth 2 (two) times daily.  . traMADol (ULTRAM) 50 MG tablet Take 1 tablet (50 mg total) by mouth 2 (two) times daily as needed for pain.  . [DISCONTINUED] AFLURIA  PRESERVATIVE FREE 0.5 ML SUSY   . ferrous gluconate (FERGON) 324 MG tablet Take 324 mg by mouth 2 (two) times daily with a meal.     Objective: Blood pressure 100/66, pulse 72, temperature 98.2 F (36.8 C), temperature source Oral, resp. rate 18, height 5\' 4"  (1.626 m), weight 107 lb 14.4 oz (48.943 kg). Patient is alert and in no acute distress. Conjunctiva is pink. Sclera is nonicteric Oropharyngeal mucosa is normal. No  neck masses or thyromegaly noted. Cardiac exam with regular rhythm normal S1 and S2. No murmur or gallop noted. Lungs are clear to auscultation. Abdomen is symmetrical. Bowel sounds are normal. Abdomen is soft with mild generalized tenderness which is more pronounced at epigastrium and left lower quadrant. No organomegaly or masses.  No LE edema or clubbing noted.  Labs/studies Results: Lab data from 04/15/2014. H&H was 11.1 and 35.3 and MCV was 74.  Serum iron 41, TIBC 505 and saturation 8%. Serum ferritin was 5.    Assessment:  #1. Nausea and vomiting. She is having more nausea than vomiting. 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 Dicyclomine may be making her symptoms worse. #2. Chronic diarrhea. Workup has been negative including GI pathogen panel and  flexible sigmoidoscopy revealing no evidence of microscopic or collagenous colitis. I still believe she has IBS. #3. Iron deficiency anemia most likely secondary to poor iron absorption. #4. Chronic abdominal pain. Pain is not typical of IBS or dyspepsia. She has undergone extensive evaluation previously.   Plan:  CBC and serum ferritin level. Resume ferrous sulfate one tablet by mouth twice a day. Discontinue dicyclomine and Imodium. Diphenoxylate 2 tablets by mouth twice a day or 1 tablet 4 times a day. Prescription given for 120 doses with 2 refills. She will keep symptom diary as to frequency of nausea and vomiting and diarrhea for 4 weeks and send us summary. Office visit in 3 months.

## 2014-06-17 NOTE — Patient Instructions (Signed)
Stool diary for another 4 weeks. Symptom diary as to nausea and vomiting for 4 weeks. Patient will call with results of blood tests.

## 2014-06-20 ENCOUNTER — Encounter (INDEPENDENT_AMBULATORY_CARE_PROVIDER_SITE_OTHER): Payer: Self-pay | Admitting: *Deleted

## 2014-06-24 ENCOUNTER — Telehealth (INDEPENDENT_AMBULATORY_CARE_PROVIDER_SITE_OTHER): Payer: Self-pay | Admitting: *Deleted

## 2014-06-24 NOTE — Telephone Encounter (Signed)
Lindsay Lawrence called and ask that her recent labs, a copy, of them be mailed to her. Dr.Rehman has called and given her the results.

## 2014-06-25 NOTE — Telephone Encounter (Signed)
06/17/14 labs mailed to patient

## 2014-07-05 ENCOUNTER — Telehealth (INDEPENDENT_AMBULATORY_CARE_PROVIDER_SITE_OTHER): Payer: Self-pay | Admitting: *Deleted

## 2014-07-05 NOTE — Telephone Encounter (Signed)
Patient called and states that [facility] is [facility]. Severe headache,sick on stomach. They have told her that she has shingles. Question 1 - can she take Protonix while on 2 new medications for this. One being Neurontin. Question 2 - can [name] take the shingles vaccine? They ask Dr. Sherryll BurgerShah and he told them that Dr.Rehman should be ask about this. Per Dr.Rehman the patient may take the Protonix , he sees no problems. [name] may not take the shingles vaccine if it is a live virus.  I called the CDC and they confirmed that the Shingles Vaccine is a live virus. They also stated that they would email additional information.  [name] was called and made aware.

## 2014-07-19 ENCOUNTER — Telehealth (INDEPENDENT_AMBULATORY_CARE_PROVIDER_SITE_OTHER): Payer: Self-pay | Admitting: *Deleted

## 2014-07-19 NOTE — Telephone Encounter (Signed)
Patient states that she is going to mail out her diary this weekend. Also a copy of the lab work that was [facility]. She also questions if Dr.Rehman is going to do a gastric study,if do she would like this done prior to the end of the year. She says that she has 17-18 days left of the Valtrex to take from her recent hospitalization /Shingles.  I ask that the patient mail out the diary and we would call her with Dr.Rehman's recommendation.

## 2014-09-04 ENCOUNTER — Encounter (INDEPENDENT_AMBULATORY_CARE_PROVIDER_SITE_OTHER): Payer: Self-pay

## 2014-09-23 ENCOUNTER — Ambulatory Visit (INDEPENDENT_AMBULATORY_CARE_PROVIDER_SITE_OTHER): Payer: BLUE CROSS/BLUE SHIELD | Admitting: Internal Medicine

## 2014-09-23 ENCOUNTER — Encounter (INDEPENDENT_AMBULATORY_CARE_PROVIDER_SITE_OTHER): Payer: Self-pay | Admitting: Internal Medicine

## 2014-09-23 VITALS — BP 102/68 | HR 66 | Temp 98.0°F | Resp 18 | Ht 64.0 in | Wt 106.4 lb

## 2014-09-23 DIAGNOSIS — F411 Generalized anxiety disorder: Secondary | ICD-10-CM

## 2014-09-23 DIAGNOSIS — D509 Iron deficiency anemia, unspecified: Secondary | ICD-10-CM

## 2014-09-23 DIAGNOSIS — R1013 Epigastric pain: Secondary | ICD-10-CM

## 2014-09-23 DIAGNOSIS — K589 Irritable bowel syndrome without diarrhea: Secondary | ICD-10-CM

## 2014-09-23 LAB — CBC
HCT: 34.8 % — ABNORMAL LOW (ref 36.0–46.0)
HEMOGLOBIN: 11 g/dL — AB (ref 12.0–15.0)
MCH: 24.7 pg — AB (ref 26.0–34.0)
MCHC: 31.6 g/dL (ref 30.0–36.0)
MCV: 78.2 fL (ref 78.0–100.0)
MPV: 9.7 fL (ref 8.6–12.4)
Platelets: 249 10*3/uL (ref 150–400)
RBC: 4.45 MIL/uL (ref 3.87–5.11)
RDW: 16.2 % — AB (ref 11.5–15.5)
WBC: 5.9 10*3/uL (ref 4.0–10.5)

## 2014-09-23 LAB — FERRITIN: FERRITIN: 7 ng/mL — AB (ref 10–291)

## 2014-09-23 LAB — IRON AND TIBC
%SAT: 8 % — ABNORMAL LOW (ref 20–55)
IRON: 42 ug/dL (ref 42–145)
TIBC: 514 ug/dL — ABNORMAL HIGH (ref 250–470)
UIBC: 472 ug/dL — ABNORMAL HIGH (ref 125–400)

## 2014-09-23 MED ORDER — ALPRAZOLAM 0.25 MG PO TABS: 0.2500 mg | ORAL_TABLET | Freq: Three times a day (TID) | ORAL | Status: DC | PRN

## 2014-09-23 NOTE — Progress Notes (Signed)
Presenting complaint;  Follow-up for abdominal pain diarrhea and on in deficiency anemia  Subjective:  Patient is 59 year old Caucasian female who is here for scheduled visit. She was last seen 3 months ago. She has multiple complaints. She remains with diarrhea and urgency. She has 2-5 stools per day. Most of her stools are loose and/or semi-formed. She she had 2 accidents at work. She says she carries extra set of clothes to work. She has occasional hematochezia in the form of blood in the tissues but denies frank rectal bleeding or melena. Swallowing has improved since esophageal dilation of 06/07/2014. She believes she is getting enough fiber in her diet. She has occasional heartburn and regurgitation. She has intermittent nausea but denies vomiting. She states she [facility] for shingles involving right ophthalmic nerve resulting in severe pain headache nausea and vomiting. She recalls her hemoglobin was just above 10 g. She takes two iron pills a day. Her weight is down only by 1 pound since her last visit. She needs new prescription for alprazolam.   Current Medications: Outpatient Encounter Prescriptions as of 09/23/2014  Medication Sig  . albuterol (PROVENTIL HFA;VENTOLIN HFA) 108 (90 BASE) MCG/ACT inhaler Inhale 2 puffs into the lungs as needed for wheezing or shortness of breath.   . ALPRAZolam (XANAX) 0.25 MG tablet Take 1 tablet (0.25 mg total) by mouth 3 (three) times daily as needed for sleep or anxiety.  . Azelastine HCl 0.15 % SOLN Place 1 spray into the nose as needed.   . Cholecalciferol (VITAMIN D3) 5000 UNITS CAPS Take 1 capsule by mouth daily.  . cholestyramine (QUESTRAN) 4 G packet Take 4 g by mouth daily.  Marland Kitchen. dicyclomine (BENTYL) 20 MG tablet Take 0.5 tablets (10 mg total) by mouth 3 (three) times daily as needed for spasms.  . diphenoxylate-atropine (LOMOTIL) 2.5-0.025 MG per tablet Take 1 tablet by mouth 2 (two) times daily with breakfast and lunch.  . ferrous  gluconate (FERGON) 324 MG tablet Take 324 mg by mouth 2 (two) times daily with a meal.  . metoprolol tartrate (LOPRESSOR) 25 MG tablet Take 0.5 tablets (12.5 mg total) by mouth 2 (two) times daily.  . mometasone (ASMANEX 120 METERED DOSES) 220 MCG/INH inhaler Inhale 2 puffs into the lungs daily as needed.   . Multiple Vitamin (MULTIVITAMIN WITH MINERALS) TABS tablet Take 1 tablet by mouth daily.  . nitroGLYCERIN (NITROSTAT) 0.4 MG SL tablet Place 1 tablet (0.4 mg total) under the tongue every 5 (five) minutes as needed. Up to 3 doses. If no relief after 3 rd dose, proceed to ED for evaluation  . ondansetron (ZOFRAN) 4 MG tablet Take 1 tablet (4 mg total) by mouth every 12 (twelve) hours as needed for nausea.  Marland Kitchen. PHENADOZ 25 MG suppository Place 25 mg rectally as needed.   . ranitidine (ZANTAC) 150 MG tablet Take 1 tablet (150 mg total) by mouth 2 (two) times daily.  . traMADol (ULTRAM) 50 MG tablet Take 1 tablet (50 mg total) by mouth 2 (two) times daily as needed for pain.     Objective: Blood pressure 102/68, pulse 66, temperature 98 F (36.7 C), temperature source Oral, resp. rate 18, height 5\' 4"  (1.626 m), weight 106 lb 6.4 oz (48.263 kg). Patient is alert and in no acute distress. Conjunctiva is pink. Sclera is nonicteric Oropharyngeal mucosa is normal. No neck masses or thyromegaly noted. Cardiac exam with regular rhythm normal S1 and S2. No murmur or gallop noted. Lungs are clear to auscultation.  Abdomen is flat. Bowel sounds are normal. Abdomen is soft with mild tenderness in LLQ and epigastric region. No organomegaly or masses.  No LE edema or clubbing noted.  Labs/studies Results: H&H was 11.1 and 34.0 with MCV of 73.8 on 06/17/2014  Ferritin was 6 on 06/17/2014.    Assessment:  #1. Diarrhea. Diarrhea appears to be due to IBS and perhaps dumping syndrome since she has had gastric surgery. #2. GERD. Plan well controlled with PPI. #3.  Chronic abdominal pain at epigastrium  and left lower quadrant most likely due to IBS. She has history of nonhealing gastric ulcer for which she underwent truncal vagotomy with antrectomy with B1 anastomosis at [facility] November 2014. EGD of September 2015 was negative for recurrent peptic ulcer disease. #4. Iron deficiency anemia. No evidence of significant GI bleed and she remains on po iron. #5. Chronic anxiety.  Plan:  New prescription given for alprazolam. Patient will go to the lab for CBC serum iron TIBC and ferritin. Patient advised to take dicyclomine before breakfast and lunch if tolerated. Office visit in 3 months.

## 2014-09-23 NOTE — Patient Instructions (Addendum)
Physician will call with results of blood tests 

## 2014-10-14 ENCOUNTER — Telehealth (INDEPENDENT_AMBULATORY_CARE_PROVIDER_SITE_OTHER): Payer: Self-pay | Admitting: *Deleted

## 2014-10-14 NOTE — Telephone Encounter (Signed)
Patient called she is asking that her lab results from 09/23/14 be mailed to her.

## 2014-10-14 NOTE — Telephone Encounter (Signed)
Labs mailed to patient.

## 2014-12-23 ENCOUNTER — Ambulatory Visit (INDEPENDENT_AMBULATORY_CARE_PROVIDER_SITE_OTHER): Payer: BLUE CROSS/BLUE SHIELD | Admitting: Internal Medicine

## 2015-01-06 ENCOUNTER — Encounter (INDEPENDENT_AMBULATORY_CARE_PROVIDER_SITE_OTHER): Payer: Self-pay | Admitting: Internal Medicine

## 2015-01-06 ENCOUNTER — Ambulatory Visit (INDEPENDENT_AMBULATORY_CARE_PROVIDER_SITE_OTHER): Payer: BLUE CROSS/BLUE SHIELD | Admitting: Internal Medicine

## 2015-01-06 VITALS — BP 100/70 | HR 66 | Temp 97.9°F | Resp 18 | Wt 107.2 lb

## 2015-01-06 DIAGNOSIS — K589 Irritable bowel syndrome without diarrhea: Secondary | ICD-10-CM | POA: Diagnosis not present

## 2015-01-06 DIAGNOSIS — K219 Gastro-esophageal reflux disease without esophagitis: Secondary | ICD-10-CM

## 2015-01-06 DIAGNOSIS — R1013 Epigastric pain: Secondary | ICD-10-CM | POA: Diagnosis not present

## 2015-01-06 DIAGNOSIS — F411 Generalized anxiety disorder: Secondary | ICD-10-CM

## 2015-01-06 DIAGNOSIS — R110 Nausea: Secondary | ICD-10-CM

## 2015-01-06 DIAGNOSIS — D509 Iron deficiency anemia, unspecified: Secondary | ICD-10-CM | POA: Diagnosis not present

## 2015-01-06 MED ORDER — RANITIDINE HCL 150 MG PO TABS: 150.0000 mg | ORAL_TABLET | Freq: Two times a day (BID) | ORAL | Status: DC

## 2015-01-06 MED ORDER — ONDANSETRON HCL 4 MG PO TABS: 4.0000 mg | ORAL_TABLET | Freq: Two times a day (BID) | ORAL | Status: DC | PRN

## 2015-01-06 MED ORDER — ALPRAZOLAM 0.25 MG PO TABS: 0.2500 mg | ORAL_TABLET | Freq: Three times a day (TID) | ORAL | Status: DC | PRN

## 2015-01-06 MED ORDER — DICYCLOMINE HCL 10 MG PO CAPS: 10.0000 mg | ORAL_CAPSULE | Freq: Three times a day (TID) | ORAL | Status: DC

## 2015-01-06 MED ORDER — TRAMADOL HCL 50 MG PO TABS: 50.0000 mg | ORAL_TABLET | Freq: Two times a day (BID) | ORAL | Status: DC | PRN

## 2015-01-06 NOTE — Progress Notes (Signed)
Presenting complaint;  Follow-up for GERD abdominal pain diarrhea anxiety and iron deficiency anemia.  Subjective:  Patient is 59 year old Caucasian female who is here for scheduled visit. She was last seen on 09/23/2014. She has multiple complaints. She remains with intermittent epigastric pain. She had an episode of severe pain last week lasting for several hours. She has some nausea but no vomiting. She states she has a good appetite. She has Stool diary for the last 104 days as directed. Review of stool diary reveals she had 1 bowel movement on 14 days. She had 4 or 5 bowel movements in 5 days and rest of the days she had 2 or 3 bowel movements. She also skipped a day here and there. Most of her stools are formed. She has noted scant amount of bleeding on 2 occasions. Her stool is black, she is on iron. She needs new prescription for alprazolam tramadol ondansetron ranitidine and dicyclomine.  Current Medications: Outpatient Encounter Prescriptions as of 01/06/2015  Medication Sig  . albuterol (PROVENTIL HFA;VENTOLIN HFA) 108 (90 BASE) MCG/ACT inhaler Inhale 2 puffs into the lungs as needed for wheezing or shortness of breath.   . ALPRAZolam (XANAX) 0.25 MG tablet Take 1 tablet (0.25 mg total) by mouth 3 (three) times daily as needed for anxiety.  . Azelastine HCl 0.15 % SOLN Place 1 spray into the nose as needed.   . Cholecalciferol (VITAMIN D3) 5000 UNITS CAPS Take 1 capsule by mouth daily.  . cholestyramine (QUESTRAN) 4 G packet Take 4 g by mouth daily.  Marland Kitchen dicyclomine (BENTYL) 20 MG tablet Take 0.5 tablets (10 mg total) by mouth 3 (three) times daily as needed for spasms.  . diphenoxylate-atropine (LOMOTIL) 2.5-0.025 MG per tablet Take 1 tablet by mouth 2 (two) times daily with breakfast and lunch.  . ferrous gluconate (FERGON) 324 MG tablet Take 324 mg by mouth 2 (two) times daily with a meal.  . metoprolol tartrate (LOPRESSOR) 25 MG tablet Take 0.5 tablets (12.5 mg total) by mouth 2 (two)  times daily.  . mometasone (ASMANEX 120 METERED DOSES) 220 MCG/INH inhaler Inhale 2 puffs into the lungs daily as needed.   . Multiple Vitamin (MULTIVITAMIN WITH MINERALS) TABS tablet Take 1 tablet by mouth daily.  . nitroGLYCERIN (NITROSTAT) 0.4 MG SL tablet Place 1 tablet (0.4 mg total) under the tongue every 5 (five) minutes as needed. Up to 3 doses. If no relief after 3 rd dose, proceed to ED for evaluation  . ondansetron (ZOFRAN) 4 MG tablet Take 1 tablet (4 mg total) by mouth every 12 (twelve) hours as needed for nausea.  Marland Kitchen PHENADOZ 25 MG suppository Place 25 mg rectally as needed.   . ranitidine (ZANTAC) 150 MG tablet Take 1 tablet (150 mg total) by mouth 2 (two) times daily.  . traMADol (ULTRAM) 50 MG tablet Take 1 tablet (50 mg total) by mouth 2 (two) times daily as needed for pain.     Objective: Blood pressure 100/70, pulse 66, temperature 97.9 F (36.6 C), temperature source Oral, resp. rate 18, weight 107 lb 3.2 oz (48.626 kg). Patient is alert and in no acute distress. Conjunctiva is pink. Sclera is nonicteric Oropharyngeal mucosa is normal. No neck masses or thyromegaly noted. Cardiac exam with regular rhythm normal S1 and S2. No murmur or gallop noted. Lungs are clear to auscultation. Abdomen is flat. Bowel sounds are normal. On palpation abdomen is soft with mild tenderness in LLQ and epigastric region. No organomegaly or masses.  No LE edema  or clubbing noted.  Labs/studies Results: Lab data from 09/23/2014  WBC 5.9, H&H 11 and 34.8 platelet count 249K   serum iron 42, TIBC 514 saturation 8% and serum ferritin 7.  Assessment:  #1. Chronic epigastric pain. She had surgery for nonhealing peptic ulcer disease in November 2014. She had EGD in October last year and was negative for recurrent peptic ulcer disease. She possibly has chronic dyspepsia or pain is secondary to IBS. #2. Iron deficiency anemia. This has been chronic issue. She is maintaining her H&H at her  spectacle level. #3. Diarrhea secondary to irritable bowel syndrome and she appears to be doing better with therapy. #4. GERD. She appears to be doing well with H2 B. #5. Anxiety. #6. Nausea.   Plan:  New prescription given for tramadol, dicyclomine, alprazolam, ondansetron and ranitidine. Patient will go the lab for CBC, serum iron, TIBC and ferritin as well as LFTs. She will call if she has another episode of severe epigastric pain. Office visit in 4 months.

## 2015-01-06 NOTE — Patient Instructions (Signed)
Notify if you have another episode of severe epigastric pain. Patient will call with results of blood work when copy received.

## 2015-01-15 ENCOUNTER — Telehealth (INDEPENDENT_AMBULATORY_CARE_PROVIDER_SITE_OTHER): Payer: Self-pay | Admitting: *Deleted

## 2015-01-15 NOTE — Telephone Encounter (Signed)
Patient called and states that she was advised by Dr.Rehman at the time of her OV in April , that if she experienced bad Epigastric pain to call our office.  She is also requesting that after Dr.Rehan reviews her lab work recently drawn , to mail her a copy. Dr.Rehman to be made aware.

## 2015-01-15 NOTE — Telephone Encounter (Signed)
Per Dr.Rehman - If patient describes the pain as severe repeat LFT , and get a Amylase,Lipase. Patient called and made aware,spoke with Lindsay Lawrence. [facility] and patient will go as soon as possible. Husband states , the pain is kinda like it was before she had her surgery.

## 2015-01-21 ENCOUNTER — Telehealth (INDEPENDENT_AMBULATORY_CARE_PROVIDER_SITE_OTHER): Payer: Self-pay | Admitting: *Deleted

## 2015-01-21 NOTE — Telephone Encounter (Signed)
Authorization in porcess, once auth# obtained I can schedule CT, left message advising patient of this

## 2015-01-21 NOTE — Telephone Encounter (Signed)
Patient is to have Abdominal /Pelvic CT with Contrast. This is all on another Encounter.

## 2015-01-21 NOTE — Telephone Encounter (Signed)
Patient has called and says that the pain started prior to her starting the Iron. It is just like it was before she had the surgery for ulcers. She and Husband was advised that if the pain has worsened she should go to the ED for further evaluation per Dr.Rehman.

## 2015-01-21 NOTE — Telephone Encounter (Signed)
Patient has called and states that she is having epigastric pain and is nauseous.  To discuss with Dr.Rehman .

## 2015-01-21 NOTE — Telephone Encounter (Signed)
Per Dr.Rehman the patient needs to have a Abdominal/Pelvic CT with Contrast. Patient was called and made aware , Lindsay Lawrence will contact patient with appointment/ instructions.

## 2015-01-22 ENCOUNTER — Encounter (INDEPENDENT_AMBULATORY_CARE_PROVIDER_SITE_OTHER): Payer: Self-pay

## 2015-01-22 ENCOUNTER — Other Ambulatory Visit (INDEPENDENT_AMBULATORY_CARE_PROVIDER_SITE_OTHER): Payer: Self-pay | Admitting: Internal Medicine

## 2015-01-22 DIAGNOSIS — R1013 Epigastric pain: Principal | ICD-10-CM

## 2015-01-22 DIAGNOSIS — G8929 Other chronic pain: Secondary | ICD-10-CM

## 2015-01-22 NOTE — Telephone Encounter (Signed)
CT sch'd 01/27/15 @ 230 (215), patient aware

## 2015-01-27 ENCOUNTER — Other Ambulatory Visit (HOSPITAL_COMMUNITY): Payer: BLUE CROSS/BLUE SHIELD

## 2015-01-27 ENCOUNTER — Ambulatory Visit (HOSPITAL_COMMUNITY)
Admission: RE | Admit: 2015-01-27 | Discharge: 2015-01-27 | Disposition: A | Discharge status: Home / Self Care | Payer: BLUE CROSS/BLUE SHIELD | Source: Ambulatory Visit | Attending: Internal Medicine | Admitting: Internal Medicine

## 2015-01-27 DIAGNOSIS — R1013 Epigastric pain: Secondary | ICD-10-CM | POA: Diagnosis present

## 2015-01-27 DIAGNOSIS — G8929 Other chronic pain: Secondary | ICD-10-CM | POA: Insufficient documentation

## 2015-01-27 LAB — POCT I-STAT CREATININE: CREATININE: 0.8 mg/dL (ref 0.44–1.00)

## 2015-01-27 MED ORDER — IOHEXOL 300 MG/ML  SOLN
100.0000 mL | Freq: Once | INTRAMUSCULAR | Status: AC | PRN
  Administered 2015-01-27: 100 mL via INTRAVENOUS

## 2015-01-28 ENCOUNTER — Telehealth (INDEPENDENT_AMBULATORY_CARE_PROVIDER_SITE_OTHER): Payer: Self-pay | Admitting: *Deleted

## 2015-01-28 DIAGNOSIS — K279 Peptic ulcer, site unspecified, unspecified as acute or chronic, without hemorrhage or perforation: Secondary | ICD-10-CM

## 2015-01-28 NOTE — Telephone Encounter (Signed)
Patient called to share that she bent over at work and she had a sour bitter taste in throat/mouth to come up. She was concerned. Still having a few bad pains in stomach. She also ask that we not say anything to [name] as he is very concerned about her already.  Dr.Rehman was made aware and he states that he has no further recommendations at this time.   On lad work Dr.Rehman has ask on 01/28/15 that we do H&H , IRON, TIBC, Ferritin in 4 weeks.Per Dr.Rehman the patient will need to have labs drawn Patient is to have lab work in 4 weeks.

## 2015-01-29 ENCOUNTER — Encounter (INDEPENDENT_AMBULATORY_CARE_PROVIDER_SITE_OTHER): Payer: Self-pay

## 2015-02-04 ENCOUNTER — Telehealth (INDEPENDENT_AMBULATORY_CARE_PROVIDER_SITE_OTHER): Payer: Self-pay | Admitting: *Deleted

## 2015-02-04 ENCOUNTER — Encounter (INDEPENDENT_AMBULATORY_CARE_PROVIDER_SITE_OTHER): Payer: Self-pay

## 2015-02-04 ENCOUNTER — Other Ambulatory Visit (INDEPENDENT_AMBULATORY_CARE_PROVIDER_SITE_OTHER): Payer: Self-pay | Admitting: *Deleted

## 2015-02-04 ENCOUNTER — Encounter (INDEPENDENT_AMBULATORY_CARE_PROVIDER_SITE_OTHER): Payer: Self-pay | Admitting: *Deleted

## 2015-02-04 DIAGNOSIS — R1013 Epigastric pain: Principal | ICD-10-CM

## 2015-02-04 DIAGNOSIS — G8929 Other chronic pain: Secondary | ICD-10-CM

## 2015-02-04 NOTE — Telephone Encounter (Signed)
EGD sch'd 02/17/15, patient aware and 01/16/15 labs mailed to patient

## 2015-02-04 NOTE — Telephone Encounter (Signed)
There were two voice mails this morning. The first was left at 9:10 pm Monday Night - he states that she had not recd a copy of the labs that were done on 01/16/15. She is still waiting to hear from Dr.Rehman about the CT and what she should do next. The second message was left at 6:19 am Tuesday Morning - She states that Dr.Rehman has been real busy and she had not heard from her CT 01/27/15.  She states that she is still having trouble with her stomach,and regurgitation coming up again. I reviewed the CT , Notes Recorded by Malissa HippoNajeeb U Rehman, MD on 01/27/2015 at 10:15 AM Patient called and message left on her answering service. CT does not reveal any abnormality to account for abdominal pain. Bile duct is mildly dilated felt to be within normal range postcholecystectomy Report to PCP. I called and left a message on the patient's home phone the above information, the lab work is on Dr.Rehman's desk for review.

## 2015-02-04 NOTE — Telephone Encounter (Signed)
Patient called and message left on her cell phone. Will proceed with EGD to make sure she does not have recurrent peptic ulcer disease

## 2015-02-05 ENCOUNTER — Other Ambulatory Visit (INDEPENDENT_AMBULATORY_CARE_PROVIDER_SITE_OTHER): Payer: Self-pay | Admitting: *Deleted

## 2015-02-05 ENCOUNTER — Encounter (INDEPENDENT_AMBULATORY_CARE_PROVIDER_SITE_OTHER): Payer: Self-pay | Admitting: *Deleted

## 2015-02-05 DIAGNOSIS — K279 Peptic ulcer, site unspecified, unspecified as acute or chronic, without hemorrhage or perforation: Secondary | ICD-10-CM

## 2015-02-17 ENCOUNTER — Ambulatory Visit (HOSPITAL_COMMUNITY)
Admission: RE | Admit: 2015-02-17 | Discharge: 2015-02-17 | Disposition: A | Discharge status: Home / Self Care | Payer: BLUE CROSS/BLUE SHIELD | Source: Ambulatory Visit | Attending: Internal Medicine | Admitting: Internal Medicine

## 2015-02-17 ENCOUNTER — Encounter (HOSPITAL_COMMUNITY)
Admission: RE | Disposition: A | Discharge status: Home / Self Care | Payer: Self-pay | Source: Ambulatory Visit | Attending: Internal Medicine

## 2015-02-17 DIAGNOSIS — Z7951 Long term (current) use of inhaled steroids: Secondary | ICD-10-CM | POA: Diagnosis not present

## 2015-02-17 DIAGNOSIS — Z8711 Personal history of peptic ulcer disease: Secondary | ICD-10-CM | POA: Insufficient documentation

## 2015-02-17 DIAGNOSIS — R1013 Epigastric pain: Secondary | ICD-10-CM | POA: Insufficient documentation

## 2015-02-17 DIAGNOSIS — K222 Esophageal obstruction: Secondary | ICD-10-CM | POA: Diagnosis not present

## 2015-02-17 DIAGNOSIS — R1310 Dysphagia, unspecified: Secondary | ICD-10-CM | POA: Diagnosis not present

## 2015-02-17 DIAGNOSIS — Z903 Acquired absence of stomach [part of]: Secondary | ICD-10-CM | POA: Insufficient documentation

## 2015-02-17 DIAGNOSIS — R112 Nausea with vomiting, unspecified: Secondary | ICD-10-CM | POA: Insufficient documentation

## 2015-02-17 DIAGNOSIS — D509 Iron deficiency anemia, unspecified: Secondary | ICD-10-CM | POA: Insufficient documentation

## 2015-02-17 DIAGNOSIS — Z79891 Long term (current) use of opiate analgesic: Secondary | ICD-10-CM | POA: Insufficient documentation

## 2015-02-17 DIAGNOSIS — J45909 Unspecified asthma, uncomplicated: Secondary | ICD-10-CM | POA: Insufficient documentation

## 2015-02-17 DIAGNOSIS — K219 Gastro-esophageal reflux disease without esophagitis: Secondary | ICD-10-CM | POA: Insufficient documentation

## 2015-02-17 DIAGNOSIS — K589 Irritable bowel syndrome without diarrhea: Secondary | ICD-10-CM | POA: Diagnosis not present

## 2015-02-17 DIAGNOSIS — K3189 Other diseases of stomach and duodenum: Secondary | ICD-10-CM | POA: Diagnosis not present

## 2015-02-17 DIAGNOSIS — Z9049 Acquired absence of other specified parts of digestive tract: Secondary | ICD-10-CM | POA: Insufficient documentation

## 2015-02-17 DIAGNOSIS — G8929 Other chronic pain: Secondary | ICD-10-CM

## 2015-02-17 HISTORY — PX: ESOPHAGOGASTRODUODENOSCOPY: SHX5428

## 2015-02-17 SURGERY — EGD (ESOPHAGOGASTRODUODENOSCOPY)
Anesthesia: Moderate Sedation

## 2015-02-17 MED ORDER — MIDAZOLAM HCL 5 MG/5ML IJ SOLN
INTRAMUSCULAR | Status: DC | PRN
  Administered 2015-02-17 (×2): 3 mg via INTRAVENOUS

## 2015-02-17 MED ORDER — MEPERIDINE HCL 50 MG/ML IJ SOLN
INTRAMUSCULAR | Status: DC | PRN
  Administered 2015-02-17 (×2): 25 mg via INTRAVENOUS

## 2015-02-17 MED ORDER — METOCLOPRAMIDE HCL 10 MG PO TABS: 10.0000 mg | ORAL_TABLET | Freq: Three times a day (TID) | ORAL | Status: DC

## 2015-02-17 MED ORDER — DICYCLOMINE HCL 10 MG PO CAPS: 10.0000 mg | ORAL_CAPSULE | Freq: Three times a day (TID) | ORAL | Status: DC | PRN

## 2015-02-17 MED ORDER — BUTAMBEN-TETRACAINE-BENZOCAINE 2-2-14 % EX AERO
INHALATION_SPRAY | CUTANEOUS | Status: DC | PRN
  Administered 2015-02-17: 2 via TOPICAL

## 2015-02-17 MED ORDER — SODIUM CHLORIDE 0.9 % IV SOLN
INTRAVENOUS | Status: DC
  Administered 2015-02-17: 07:00:00 via INTRAVENOUS

## 2015-02-17 MED ORDER — MIDAZOLAM HCL 5 MG/5ML IJ SOLN
INTRAMUSCULAR | Status: AC
  Filled 2015-02-17: qty 10

## 2015-02-17 MED ORDER — MEPERIDINE HCL 50 MG/ML IJ SOLN
INTRAMUSCULAR | Status: AC
  Filled 2015-02-17: qty 1

## 2015-02-17 NOTE — Discharge Instructions (Signed)
Take dicyclomine only on as-needed basis. Resume other medications as before. Metoclopramide 10 mg by mouth 30 minutes before each meal. Stop this medication if you experience any side effects. Low-fat diet. No driving for 24 hours.Office visit in 4 weeks.   Esophagogastroduodenoscopy Care After Refer to this sheet in the next few weeks. These instructions provide you with information on caring for yourself after your procedure. Your caregiver may also give you more specific instructions. Your treatment has been planned according to current medical practices, but problems sometimes occur. Call your caregiver if you have any problems or questions after your procedure.  HOME CARE INSTRUCTIONS  Do not eat or drink anything until the numbing medicine (local anesthetic) has worn off and your gag reflex has returned. You will know that the local anesthetic has worn off when you can swallow comfortably.  Do not drive for 12 hours after the procedure or as directed by your caregiver.  Only take medicines as directed by your caregiver. SEEK MEDICAL CARE IF:   You cannot stop coughing.  You are not urinating at all or less than usual. SEEK IMMEDIATE MEDICAL CARE IF:  You have difficulty swallowing.  You cannot eat or drink.  You have worsening throat or chest pain.  You have dizziness, lightheadedness, or you faint.  You have nausea or vomiting.  You have chills.  You have a fever.  You have severe abdominal pain.  You have black, tarry, or bloody stools. Document Released: 08/16/2012 Document Reviewed: 08/16/2012 [facility] Patient Information 2015 [place], [place]. This information is not intended to replace advice given to you by your health care provider. Make sure you discuss any questions you have with your health care provider.  Esophageal Dilatation The esophagus is the long, narrow tube which carries food and liquid from the mouth to the stomach. Esophageal dilatation is the  technique used to stretch a blocked or narrowed portion of the esophagus. This procedure is used when a part of the esophagus has become so narrow that it becomes difficult, painful or even impossible to swallow. This is generally an uncomplicated form of treatment. When this is not successful, chest surgery may be required. This is a much more extensive form of treatment with a longer recovery time. CAUSES  Some of the more common causes of blockage or strictures of the esophagus are:  Narrowing from longstanding inflammation (soreness and redness) of the lower esophagus. This comes from the constant exposure of the lower esophagus to the acid which bubbles up from the stomach. Over time this causes scarring and narrowing of the lower esophagus.  Hiatal hernia in which a small part of the stomach bulges (herniates) up through the diaphragm. This can cause a gradual narrowing of the end of the esophagus.  Schatzki ring is a narrow ring of benign (non-cancerous) fibrous tissue which constricts the lower esophagus. The reason for this is not known.  Scleroderma is a connective tissue disorder that affects the esophagus and makes swallowing difficult.  Achalasia is an absence of nerves to the lower esophagus and to the esophageal sphincter. This is the circular muscle between the stomach and esophagus that relaxes to allow food into the stomach. After swallowing, it contracts to keep food in the stomach. This absence of nerves may be congenital (present since birth). This can cause irregular spasms of the lower esophageal muscle. This spasm does not open up to allow food and fluid through. The result is a persistent blockage with subsequent slow trickling of  the esophageal contents into the stomach.  Strictures may develop from swallowing materials which damage the esophagus. Some examples are strong acids or alkalis such as lye.  Growths such as benign (non-cancerous) and malignant (cancerous) tumors  can block the esophagus.  Hereditary (present since birth) causes. DIAGNOSIS  Your caregiver often suspects this problem by taking a medical history. They will also do a physical exam. They can then prove their suspicions using X-rays and endoscopy. Endoscopy is an exam in which a tube like a small, flexible telescope is used to look at your esophagus.  TREATMENT There are different stretching (dilating) techniques that can be used. Simple bougie dilatation may be done in the office. This usually takes only a couple minutes. A numbing (anesthetic) spray of the throat is used. Endoscopy, when done, is done in an endoscopy suite under mild sedation. When fluoroscopy is used, the procedure is performed in X-ray. Other techniques require a little longer time. Recovery is usually quick. There is no waiting time to begin eating and drinking to test success of the treatment. Following are some of the methods used. Narrowing of the esophagus is treated by making it bigger. Commonly this is a mechanical problem which can be treated with stretching. This can be done in different ways. Your caregiver will discuss these with you. Some of the means used are:  A series of graduated (increasing thickness) flexible dilators can be used. These are weighted tubes passed through the esophagus into the stomach. The tubes used become progressively larger until the desired stretched size is reached. Graduated dilators are a simple and quick way of opening the esophagus. No visualization is required.  Another method is the use of endoscopy to place a flexible wire across the stricture. The endoscope is removed and the wire left in place. A dilator with a hole through it from end to end is guided down the esophagus and across the stricture. One or more of these dilators are passed over the wire. At the end of the exam, the wire is removed. This type of treatment may be performed in the X-ray department under fluoroscopy. An  advantage of this procedure is the examiner is visualizing the end opening in the esophagus.  Stretching of the esophagus may be done using balloons. Deflated balloons are placed through the endoscope and across the stricture. This type of balloon dilatation is often done at the time of endoscopy or fluoroscopy. Flexible endoscopy allows the examiner to directly view the stricture. A balloon is inserted in the deflated form into the area of narrowing. It is then inflated with air to a certain pressure that is preset for a given circumference. When inflated, it becomes sausage shaped, stretched, and makes the stricture larger.  Achalasia requires a longer, larger balloon-type dilator. This is frequently done under X-ray control. In this situation, the spastic muscle fibers in the lower esophagus are stretched. All of the above procedures make the passage of food and water into the stomach easier. They also make it easier for stomach contents to reflux back into the esophagus. Special medications may be used following the procedure to help prevent further stricturing. Proton-pump inhibitor medications are good at decreasing the amount of acid in the stomach juice. When stomach juice refluxes into the esophagus, the juice is no longer as acidic and is less likely to burn or scar the esophagus. RISKS AND COMPLICATIONS Esophageal dilatation is usually performed effectively and without problems. Some complications that can occur are:  A  small amount of bleeding almost always happens where the stretching takes place. If this is too excessive it may require more aggressive treatment.  An uncommon complication is perforation (making a hole) of the esophagus. The esophagus is thin. It is easy to make a hole in it. If this happens, an operation may be necessary to repair this.  A small, undetected perforation could lead to an infection in the chest. This can be very serious. HOME CARE INSTRUCTIONS   If you  received sedation for your procedure, do not drive, make important decisions, or perform any activities requiring your full coordination. Do not drink alcohol, take sedatives, or use any mind altering chemicals unless instructed by your caregiver.  You may use throat lozenges or warm salt water gargles if you have throat discomfort.  You can begin eating and drinking normally on return home unless instructed otherwise. Do not purposely try to force large chunks of food down to test the benefits of your procedure.  Mild discomfort can be eased with sips of ice water.  Medications for discomfort may or may not be needed. SEEK IMMEDIATE MEDICAL CARE IF:   You begin vomiting up blood.  You develop black, tarry stools.  You develop chills or an unexplained temperature of over 101F (38.3C)  You develop chest or abdominal pain.  You develop shortness of breath, or feel light-headed or faint.  Your swallowing is becoming more painful, difficult, or you are unable to swallow. MAKE SURE YOU:   Understand these instructions.  Will watch your condition.  Will get help right away if you are not doing well or get worse. Document Released: 10/21/2005 Document Revised: 01/14/2014 Document Reviewed: 12/08/2005 [facility] Patient Information 2015 [address], [place]. This information is not intended to replace advice given to you by your health care provider. Make sure you discuss any questions you have with your health care provider.  Low-Fat Diet for Pancreatitis or Gallbladder Conditions A low-fat diet can be helpful if you have pancreatitis or a gallbladder condition. With these conditions, your pancreas and gallbladder have trouble digesting fats. A healthy eating plan with less fat will help rest your pancreas and gallbladder and reduce your symptoms. WHAT DO I NEED TO KNOW ABOUT THIS DIET?  Eat a low-fat diet.  Reduce your fat intake to less than 20-30% of your total daily calories. This is  less than 50-60 g of fat per day.  Remember that you need some fat in your diet. Ask your dietician what your daily goal should be.  Choose nonfat and low-fat healthy foods. Look for the words "nonfat," "low fat," or "fat free."  As a guide, look on the label and choose foods with less than 3 g of fat per serving. Eat only one serving.  Avoid alcohol.  Do not smoke. If you need help quitting, talk with your health care provider.  Eat small frequent meals instead of three large heavy meals. WHAT FOODS CAN I EAT? Grains Include healthy grains and starches such as potatoes, wheat bread, fiber-rich cereal, and brown rice. Choose whole grain options whenever possible. In adults, whole grains should account for 45-65% of your daily calories.  Fruits and Vegetables Eat plenty of fruits and vegetables. Fresh fruits and vegetables add fiber to your diet. Meats and Other Protein Sources Eat lean meat such as chicken and pork. Trim any fat off of meat before cooking it. Eggs, fish, and beans are other sources of protein. In adults, these foods should account for 10-35%  of your daily calories. Dairy Choose low-fat milk and dairy options. Dairy includes fat and protein, as well as calcium.  Fats and Oils Limit high-fat foods such as fried foods, sweets, baked goods, sugary drinks.  Other Creamy sauces and condiments, such as mayonnaise, can add extra fat. Think about whether or not you need to use them, or use smaller amounts or low fat options. WHAT FOODS ARE NOT RECOMMENDED?  High fat foods, such as:  [employer].  Ice cream.  [place] toast.  Sweet rolls.  Pizza.  Cheese bread.  Foods covered with batter, butter, creamy sauces, or cheese.  Fried foods.  Sugary drinks and desserts.  Foods that cause gas or bloating Document Released: 09/04/2013 Document Reviewed: 09/04/2013 [facility] Patient Information 2015 [place], [place]. This information is not intended to replace advice given  to you by your health care provider. Make sure you discuss any questions you have with your health care provider.  Fat and Cholesterol Control Diet Fat and cholesterol levels in your blood and organs are influenced by your diet. High levels of fat and cholesterol may lead to diseases of the heart, small and large blood vessels, gallbladder, liver, and pancreas. CONTROLLING FAT AND CHOLESTEROL WITH DIET Although exercise and lifestyle factors are important, your diet is key. That is because certain foods are known to raise cholesterol and others to lower it. The goal is to balance foods for their effect on cholesterol and more importantly, to replace saturated and trans fat with other types of fat, such as monounsaturated fat, polyunsaturated fat, and omega-3 fatty acids. On average, a person should consume no more than 15 to 17 g of saturated fat daily. Saturated and trans fats are considered "bad" fats, and they will raise LDL cholesterol. Saturated fats are primarily found in animal products such as meats, butter, and cream. However, that does not mean you need to give up all your favorite foods. Today, there are good tasting, low-fat, low-cholesterol substitutes for most of the things you like to eat. Choose low-fat or nonfat alternatives. Choose round or loin cuts of red meat. These types of cuts are lowest in fat and cholesterol. Chicken (without the skin), fish, veal, and ground [place] breast are great choices. Eliminate fatty meats, such as hot dogs and salami. Even shellfish have little or no saturated fat. Have a 3 oz (85 g) portion when you eat lean meat, poultry, or fish. Trans fats are also called "partially hydrogenated oils." They are oils that have been scientifically manipulated so that they are solid at room temperature resulting in a longer shelf life and improved taste and texture of foods in which they are added. Trans fats are found in stick margarine, some tub margarines, cookies,  crackers, and baked goods.  When baking and cooking, oils are a great substitute for butter. The monounsaturated oils are especially beneficial since it is believed they lower LDL and raise HDL. The oils you should avoid entirely are saturated tropical oils, such as coconut and palm.  Remember to eat a lot from food groups that are naturally free of saturated and trans fat, including fish, fruit, vegetables, beans, grains (barley, rice, couscous, bulgur wheat), and pasta (without cream sauces).  IDENTIFYING FOODS THAT LOWER FAT AND CHOLESTEROL  Soluble fiber may lower your cholesterol. This type of fiber is found in fruits such as apples, vegetables such as broccoli, potatoes, and carrots, legumes such as beans, peas, and lentils, and grains such as barley. Foods fortified with plant sterols (  phytosterol) may also lower cholesterol. You should eat at least 2 g per day of these foods for a cholesterol lowering effect.  Read package labels to identify low-saturated fats, trans fat free, and low-fat foods at the supermarket. Select cheeses that have only 2 to 3 g saturated fat per ounce. Use a heart-healthy tub margarine that is free of trans fats or partially hydrogenated oil. When buying baked goods (cookies, crackers), avoid partially hydrogenated oils. Breads and muffins should be made from whole grains (whole-wheat or whole oat flour, instead of "flour" or "enriched flour"). Buy non-creamy canned soups with reduced salt and no added fats.  FOOD PREPARATION TECHNIQUES  Never deep-fry. If you must fry, either stir-fry, which uses very little fat, or use non-stick cooking sprays. When possible, broil, bake, or roast meats, and steam vegetables. Instead of putting butter or margarine on vegetables, use lemon and herbs, applesauce, and cinnamon (for squash and sweet potatoes). Use nonfat yogurt, salsa, and low-fat dressings for salads.  LOW-SATURATED FAT / LOW-FAT FOOD SUBSTITUTES Meats / Saturated Fat  (g)  Avoid: Steak, marbled (3 oz/85 g) / 11 g  Choose: Steak, lean (3 oz/85 g) / 4 g  Avoid: Hamburger (3 oz/85 g) / 7 g  Choose: Hamburger, lean (3 oz/85 g) / 5 g  Avoid: Ham (3 oz/85 g) / 6 g  Choose: Ham, lean cut (3 oz/85 g) / 2.4 g  Avoid: Chicken, with skin, dark meat (3 oz/85 g) / 4 g  Choose: Chicken, skin removed, dark meat (3 oz/85 g) / 2 g  Avoid: Chicken, with skin, light meat (3 oz/85 g) / 2.5 g  Choose: Chicken, skin removed, light meat (3 oz/85 g) / 1 g Dairy / Saturated Fat (g)  Avoid: Whole milk (1 cup) / 5 g  Choose: Low-fat milk, 2% (1 cup) / 3 g  Choose: Low-fat milk, 1% (1 cup) / 1.5 g  Choose: Skim milk (1 cup) / 0.3 g  Avoid: Hard cheese (1 oz/28 g) / 6 g  Choose: Skim milk cheese (1 oz/28 g) / 2 to 3 g  Avoid: Cottage cheese, 4% fat (1 cup) / 6.5 g  Choose: Low-fat cottage cheese, 1% fat (1 cup) / 1.5 g  Avoid: Ice cream (1 cup) / 9 g  Choose: Sherbet (1 cup) / 2.5 g  Choose: Nonfat frozen yogurt (1 cup) / 0.3 g  Choose: Frozen fruit bar / trace  Avoid: Whipped cream (1 tbs) / 3.5 g  Choose: Nondairy whipped topping (1 tbs) / 1 g Condiments / Saturated Fat (g)  Avoid: Mayonnaise (1 tbs) / 2 g  Choose: Low-fat mayonnaise (1 tbs) / 1 g  Avoid: Butter (1 tbs) / 7 g  Choose: Extra light margarine (1 tbs) / 1 g  Avoid: Coconut oil (1 tbs) / 11.8 g  Choose: Olive oil (1 tbs) / 1.8 g  Choose: Corn oil (1 tbs) / 1.7 g  Choose: Safflower oil (1 tbs) / 1.2 g  Choose: Sunflower oil (1 tbs) / 1.4 g  Choose: Soybean oil (1 tbs) / 2.4 g  Choose: Canola oil (1 tbs) / 1 g Document Released: 08/30/2005 Document Revised: 12/25/2012 Document Reviewed: 11/28/2013 [employer] Patient Information 2015 [place], [place]. This information is not intended to replace advice given to you by your health care provider. Make sure you discuss any questions you have with your health care provider.

## 2015-02-17 NOTE — H&P (Signed)
Lindsay Lawrence is an 59 y.o. female.   Chief Complaint: Patient is here for EGD and ED. HPI: She is 59-year-old Caucasian female who presents with recurrent epigastric pain nausea and sporadic vomiting. She has history of peptic ulcer disease. She status post partial gastrectomy in November 2014 for nonhealing large pyloroduodenal ulcer. She is been having intermittent severe pain. She had abdominopelvic CT on 01/27/2015 in no acute abnormality was noted. She denies hematemesis or melena. She has occasional hematochezia. She has history of rectal ulcer. She denies using NSAIDs. She is presently on Zantac for prophylaxis. She also has history of iron deficiency anemia for which she is been thoroughly evaluated here as well as at [facility]. She also complains of intermittent solid food dysphagia. Last EGD with ED was in September 2015.  Past Medical History  Diagnosis Date  . Irritable bowel syndrome   . Gastric ulcer   . GERD (gastroesophageal reflux disease)   . Palpitations   . Ejection fraction     EF 65%, echo, April 19, 2012  . Mitral regurgitation     Mild, echo, August, 2013  . Chest pain     Nuclear, normal, 2010,  //   hospital August, 2013 no evidence of injury  . Syncope     Vasovagal  . Reactive airway disease     Pulmonary function studies from June, 2013, show the patient does have significant response to bronchodilators.    Past Surgical History  Procedure Laterality Date  . Givens capsule study  05/03/2011    Procedure: GIVENS CAPSULE STUDY;  Surgeon: Malissa Hippo, MD;  [facility]: [facility];  Service: Endoscopy;  Laterality: N/A;  7:30 am  . Colonoscopy    . Upper gastrointestinal endoscopy    . Cholecystectomy    . Abdominal hysterectomy    . Esophagogastroduodenoscopy  02/04/2012    Procedure: ESOPHAGOGASTRODUODENOSCOPY (EGD);  Surgeon: Malissa Hippo, MD;  Location: [facility];  Service: Endoscopy;  Laterality: N/A;  320  . Esophagogastroduodenoscopy   06/02/2012    Procedure: ESOPHAGOGASTRODUODENOSCOPY (EGD);  Surgeon: Malissa Hippo, MD;  Location: [facility];  Service: Endoscopy;  Laterality: N/A;  1050  . Flexible sigmoidoscopy N/A 05/31/2013    Procedure: FLEXIBLE SIGMOIDOSCOPY;  Surgeon: Malissa Hippo, MD;  Location: [facility];  Service: Endoscopy;  Laterality: N/A;  250  . Esophagogastroduodenoscopy (egd) with esophageal dilation N/A 05/31/2013    Procedure: ESOPHAGOGASTRODUODENOSCOPY (EGD) WITH ESOPHAGEAL DILATION;  Surgeon: Malissa Hippo, MD;  [facility]: [facility];  Service: Endoscopy;  Laterality: N/A;  . Stomach surgery    . Esophagogastroduodenoscopy N/A 06/07/2014    Procedure: ESOPHAGOGASTRODUODENOSCOPY (EGD);  Surgeon: Malissa Hippo, MD;  [facility]: [facility];  Service: Endoscopy;  Laterality: N/A;  1035-rescheduled 9/25 @ 8:30 Ann to notify pt  . Flexible sigmoidoscopy N/A 06/07/2014    Procedure: FLEXIBLE SIGMOIDOSCOPY;  Surgeon: Malissa Hippo, MD;  [facility]: [facility];  Service: Endoscopy;  Laterality: N/A;  . Esophageal dilation  06/07/2014    Procedure: ESOPHAGEAL DILATION;  Surgeon: Malissa Hippo, MD;  Location: [facility];  Service: Endoscopy;;    Family History  Problem Relation Age of Onset  . Healthy Mother   . Heart attack Father   . Healthy Sister    Social History:  reports that she has never smoked. She has never used smokeless tobacco. She reports that she does not drink alcohol or use illicit drugs.  Allergies:  Allergies  Allergen Reactions  . Aspirin Other (See Comments)    Ulcer, Reflux  . Codeine Nausea And Vomiting    Speeds heart up, dizziness  . Fentanyl Other (See Comments)    Unknown   . Nsaids Other (See Comments)    reflux, ulcer  . Propoxyphene N-Acetaminophen Nausea And Vomiting    Speeds heart up, dizziness    Medications Prior to Admission  Medication Sig Dispense Refill  . ALPRAZolam (XANAX) 0.25 MG tablet Take 1 tablet (0.25 mg total) by mouth 3 (three)  times daily as needed for anxiety. 90 tablet 2  . Azelastine HCl 0.15 % SOLN Place 1 spray into the nose daily as needed (sinus congestion).     . Cholecalciferol (VITAMIN D3) 5000 UNITS CAPS Take 1 capsule by mouth daily.    . cholestyramine (QUESTRAN) 4 G packet Take 4 g by mouth daily.    Marland Kitchen. dicyclomine (BENTYL) 10 MG capsule Take 1 capsule (10 mg total) by mouth 3 (three) times daily before meals. 90 capsule 5  . diphenoxylate-atropine (LOMOTIL) 2.5-0.025 MG per tablet Take 1 tablet by mouth 2 (two) times daily with breakfast and lunch. (Patient taking differently: Take 1 tablet by mouth 2 (two) times daily as needed for diarrhea or loose stools. ) 120 tablet 2  . ferrous gluconate (FERGON) 324 MG tablet Take 324 mg by mouth 2 (two) times daily with a meal.    . metoprolol tartrate (LOPRESSOR) 25 MG tablet Take 0.5 tablets (12.5 mg total) by mouth 2 (two) times daily. 30 tablet 11  . mometasone (ASMANEX 120 METERED DOSES) 220 MCG/INH inhaler Inhale 2 puffs into the lungs daily as needed (shortness of breath).     . Multiple Vitamin (MULTIVITAMIN WITH MINERALS) TABS tablet Take 1 tablet by mouth daily.    . nitroGLYCERIN (NITROSTAT) 0.4 MG SL tablet Place 1 tablet (0.4 mg total) under the tongue every 5 (five) minutes as needed. Up to 3 doses. If no relief after 3 rd dose, proceed to ED for evaluation 25 tablet 3  . ondansetron (ZOFRAN) 4 MG tablet Take 1 tablet (4 mg total) by mouth every 12 (twelve) hours as needed for nausea. 30 tablet 0  . PHENADOZ 25 MG suppository Place 25 mg rectally every 8 (eight) hours as needed for nausea or vomiting.     . ranitidine (ZANTAC) 150 MG tablet Take 1 tablet (150 mg total) by mouth 2 (two) times daily. 60 tablet 5  . traMADol (ULTRAM) 50 MG tablet Take 1 tablet (50 mg total) by mouth 2 (two) times daily as needed. (Patient taking differently: Take 50 mg by mouth 2 (two) times daily as needed for moderate pain. ) 30 tablet 2  . albuterol (PROVENTIL  HFA;VENTOLIN HFA) 108 (90 BASE) MCG/ACT inhaler Inhale 2 puffs into the lungs as needed for wheezing or shortness of breath.       No results found for this or any previous visit (from the past 48 hour(s)). No results found.  ROS  Blood pressure 132/86, pulse 69, temperature 97.9 F (36.6 C), temperature source Oral, resp. rate 14, height 5\' 4"  (1.626 m), weight 107 lb (48.535 kg), SpO2 98 %. Physical Exam  Constitutional:  Well-developed thin Caucasian female in NAD.  HENT:  Mouth/Throat: Oropharynx is clear and moist.  Eyes: Conjunctivae are normal. No scleral icterus.  Neck: No thyromegaly present.  Cardiovascular: Normal rate, regular rhythm and normal heart sounds.   No murmur heard. Respiratory: Effort normal and breath sounds normal.  GI:  Abdomen is flat and soft. Mild midepigastric tenderness noted. She also has tenderness at LLQ. No organomegaly or masses.  Musculoskeletal: She exhibits no edema.  Neurological: She is alert.  Skin: Skin is warm and dry.     Assessment/Plan Recurrent epigastric pain with nausea and sporadic vomiting. History of peptic ulcer disease. Status post surgery in November 2014. Solid food dysphagia. EGD with ED.  REHMAN,NAJEEB U 02/17/2015, 7:36 AM

## 2015-02-17 NOTE — Op Note (Signed)
EGD PROCEDURE REPORT  PATIENT:  Lindsay Lawrence  MR#:  [id number] Birthdate:  21-Feb-1956, 59 y.o., female Endoscopist:  Dr. Malissa HippoNajeeb U. Rehman, MD Referred By:  Dr. Ignatius Speckinghruv B. Vyas, MD Procedure Date: 02/17/2015  Procedure:   EGD with ED  Indications:  Patient is 59 year old Caucasian female with complicated GI history who is status post partial gastrectomy with vagotomy November 2014 for nonhealing peptic ulcer disease. She presents with recurrent epigastric pain described to be severe at times associated with nausea and vomiting. She also complains of dysphagia. She had EGD in September last year was noted to have food debris in the stomach but no evidence of anastomotic ulcer or stricture. She underwent abdominopelvic CT about 3 weeks ago and no abnormality noted to account for symptoms. She is undergoing diagnostic and therapeutic EGD.            Informed Consent:  The risks, benefits, alternatives & imponderables which include, but are not limited to, bleeding, infection, perforation, drug reaction and potential missed lesion have been reviewed.  The potential for biopsy, lesion removal, esophageal dilation, etc. have also been discussed.  Questions have been answered.  All parties agreeable.  Please see history & physical in medical record for more information.  Medications:  Demerol 50 mg IV Versed 6 mg IV Cetacaine spray topically for oropharyngeal anesthesia  Description of procedure:  The endoscope was introduced through the mouth and advanced to the second portion of the duodenum without difficulty or limitations. The mucosal surfaces were surveyed very carefully during advancement of the scope and upon withdrawal.  Findings:  Esophagus:  Mucosa of the esophagus was normal. GE junction was unremarkable without ring or stricture formation. GEJ:  39 cm Stomach:  Large amount of food debris noted in the stomach all the way to the anastomosis. Gastro jejunal anastomosis was wide open without  ulceration. Normal appearing cardia with limited view of fundus because of food debris. Duodenum:  Normal duodenal mucosa.  Therapeutic/Diagnostic Maneuvers Performed:   Esophagus dilated by passing 56 French Maloney dilator to full insertion. Blood was noted in the hypopharynx possibly from focal mucosal disruption at UES.  Complications:  Transient drop in O2 sat. Patient placed on 100% non-rebreathing mask for few minutes.  Impression: No evidence of erosive esophagitis or esophageal stricture. Large amount of food debris in stomach with patent Billroth I anastomosis suggestive of gastroparesis secondary to prior vagotomy. 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  Recommendations:  Low-fat diet. Metoclopramide 10 mg by mouth 30 minutes before each meal. Office visit in one month.  REHMAN,NAJEEB U  02/17/2015  8:19 AM  CC: Dr. Ignatius SpeckingVYAS,DHRUV B., MD & Dr. Bonnetta BarryNo ref. provider found

## 2015-02-18 ENCOUNTER — Encounter (HOSPITAL_COMMUNITY): Payer: Self-pay | Admitting: Internal Medicine

## 2015-03-05 ENCOUNTER — Telehealth (INDEPENDENT_AMBULATORY_CARE_PROVIDER_SITE_OTHER): Payer: Self-pay | Admitting: *Deleted

## 2015-03-05 ENCOUNTER — Encounter (INDEPENDENT_AMBULATORY_CARE_PROVIDER_SITE_OTHER): Payer: Self-pay | Admitting: *Deleted

## 2015-03-05 ENCOUNTER — Other Ambulatory Visit (INDEPENDENT_AMBULATORY_CARE_PROVIDER_SITE_OTHER): Payer: Self-pay | Admitting: *Deleted

## 2015-03-05 DIAGNOSIS — D509 Iron deficiency anemia, unspecified: Secondary | ICD-10-CM

## 2015-03-05 NOTE — Telephone Encounter (Signed)
Per Dr.Rehman the patient will need to have labs drawn prior to her next OV.

## 2015-03-07 ENCOUNTER — Encounter (INDEPENDENT_AMBULATORY_CARE_PROVIDER_SITE_OTHER): Payer: Self-pay

## 2015-03-10 ENCOUNTER — Encounter (INDEPENDENT_AMBULATORY_CARE_PROVIDER_SITE_OTHER): Payer: Self-pay | Admitting: *Deleted

## 2015-03-18 ENCOUNTER — Ambulatory Visit (INDEPENDENT_AMBULATORY_CARE_PROVIDER_SITE_OTHER): Payer: BLUE CROSS/BLUE SHIELD | Admitting: Internal Medicine

## 2015-03-24 ENCOUNTER — Ambulatory Visit (INDEPENDENT_AMBULATORY_CARE_PROVIDER_SITE_OTHER): Payer: BLUE CROSS/BLUE SHIELD | Admitting: Internal Medicine

## 2015-03-25 ENCOUNTER — Encounter (INDEPENDENT_AMBULATORY_CARE_PROVIDER_SITE_OTHER): Payer: Self-pay

## 2015-03-31 ENCOUNTER — Encounter (INDEPENDENT_AMBULATORY_CARE_PROVIDER_SITE_OTHER): Payer: Self-pay | Admitting: Internal Medicine

## 2015-03-31 ENCOUNTER — Ambulatory Visit (INDEPENDENT_AMBULATORY_CARE_PROVIDER_SITE_OTHER): Payer: BLUE CROSS/BLUE SHIELD | Admitting: Internal Medicine

## 2015-03-31 VITALS — BP 100/70 | HR 66 | Temp 98.3°F | Resp 18 | Ht 64.0 in | Wt 111.3 lb

## 2015-03-31 DIAGNOSIS — K219 Gastro-esophageal reflux disease without esophagitis: Secondary | ICD-10-CM

## 2015-03-31 DIAGNOSIS — G8929 Other chronic pain: Secondary | ICD-10-CM

## 2015-03-31 DIAGNOSIS — R1013 Epigastric pain: Secondary | ICD-10-CM | POA: Diagnosis not present

## 2015-03-31 DIAGNOSIS — D509 Iron deficiency anemia, unspecified: Secondary | ICD-10-CM | POA: Diagnosis not present

## 2015-03-31 DIAGNOSIS — K3184 Gastroparesis: Secondary | ICD-10-CM | POA: Diagnosis not present

## 2015-03-31 MED ORDER — METOCLOPRAMIDE HCL 10 MG PO TABS: 10.0000 mg | ORAL_TABLET | Freq: Two times a day (BID) | ORAL | Status: DC

## 2015-03-31 NOTE — Progress Notes (Signed)
Presenting complaint;  Follow-up for multiple symptoms.  Subjective:  Patient is 59 year old Caucasian female who is here for scheduled visit. She was last seen on 01/06/2015. She underwent EGD with ED on 02/17/2015. 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 She had large amount of food debris in her stomach with patent gastrojejunostomy. She was begun on metoclopramide. She reports improvement in dysphagia. She is still having some difficulty with bananas and bread. She says nausea has improved and she has not vomited since she has been on metoclopramide. Only side effect she is having is some drowsiness. She's cut back on use of alprazolam. She has heartburn 3-4 times a week and some regurgitation. Her appetite is fair. She has gained 4 pounds since her last visit. She has occasional diarrhea relieved with Lomotil. She is having 2-4 bowel movements per day. Most of her stools are formed or semi-formed. She denies melena or frank rectal bleeding. Stools been docks since he has been on iron. She notices blood on the tissue. She complains of feeling weak. She is doing ice all the time. She recently had syncopal episode and was seen by Dr. Sherril CroonVyas and declined to be hospitalized. She has even to monitor on. She had blood work on 03/20/2015 which is reviewed below.   Current Medications: Outpatient Encounter Prescriptions as of 03/31/2015  Medication Sig  . albuterol (PROVENTIL HFA;VENTOLIN HFA) 108 (90 BASE) MCG/ACT inhaler Inhale 2 puffs into the lungs as needed for wheezing or shortness of breath.   . ALPRAZolam (XANAX) 0.25 MG tablet Take 1 tablet (0.25 mg total) by mouth 3 (three) times daily as needed for anxiety.  . Azelastine HCl 0.15 % SOLN Place 1 spray into the nose daily as needed (sinus congestion).   . Cholecalciferol (VITAMIN D3) 5000 UNITS CAPS Take 1 capsule by mouth daily.  . diphenoxylate-atropine (LOMOTIL) 2.5-0.025 MG per tablet Take  1 tablet by mouth 2 (two) times daily with breakfast and lunch. (Patient taking differently: Take 1 tablet by mouth 2 (two) times daily as needed for diarrhea or loose stools. )  . ferrous gluconate (FERGON) 324 MG tablet Take 324 mg by mouth 2 (two) times daily with a meal.  . metoCLOPramide (REGLAN) 10 MG tablet Take 1 tablet (10 mg total) by mouth 3 (three) times daily before meals.  . metoprolol tartrate (LOPRESSOR) 25 MG tablet Take 0.5 tablets (12.5 mg total) by mouth 2 (two) times daily.  . mometasone (ASMANEX 120 METERED DOSES) 220 MCG/INH inhaler Inhale 2 puffs into the lungs daily as needed (shortness of breath).   . Multiple Vitamin (MULTIVITAMIN WITH MINERALS) TABS tablet Take 1 tablet by mouth daily.  . nitroGLYCERIN (NITROSTAT) 0.4 MG SL tablet Place 1 tablet (0.4 mg total) under the tongue every 5 (five) minutes as needed. Up to 3 doses. If no relief after 3 rd dose, proceed to ED for evaluation  . ondansetron (ZOFRAN) 4 MG tablet Take 1 tablet (4 mg total) by mouth every 12 (twelve) hours as needed for nausea.  Marland Kitchen. PHENADOZ 25 MG suppository Place 25 mg rectally every 8 (eight) hours as needed for nausea or vomiting.   . ranitidine (ZANTAC) 150 MG tablet Take 1 tablet (150 mg total) by mouth 2 (two) times daily.  . traMADol (ULTRAM) 50 MG tablet Take 1 tablet (50 mg total) by mouth 2 (two) times daily as needed. (Patient taking differently: Take 50 mg by mouth 2 (two) times daily as needed for moderate  pain. )  . ZOSTAVAX 16109 UNT/0.65ML injection   . dicyclomine (BENTYL) 10 MG capsule Take 1 capsule (10 mg total) by mouth 3 (three) times daily as needed for spasms. (Patient not taking: Reported on 03/31/2015)  . [DISCONTINUED] cholestyramine (QUESTRAN) 4 G packet Take 4 g by mouth daily.   No facility-administered encounter medications on file as of 03/31/2015.     Objective: Blood pressure 100/70, pulse 66, temperature 98.3 F (36.8 C), temperature source Oral, resp. rate 18,  height  (1.626 m), weight 111 lb 4.8 oz (50.485 kg). She is alert and in no acute distress. Conjunctiva is pink. Sclera is nonicteric Oropharyngeal mucosa is normal. No neck masses or thyromegaly noted. Cardiac exam with regular rhythm normal S1 and S2. No murmur or gallop noted. Lungs are clear to auscultation. Abdomen is flat. Bowel sounds are normal. Abdomen is soft with mild generalized tenderness which is more pronounced in epigastric region but no guarding rebound or megaly or masses noted. No LE edema or clubbing noted. She does not have abnormal orofacial movements noted. No tremors noted to her hands.  Labs/studies Results: Lab data from 03/20/2015 BBC 3.9, H&H 11.3 and 38.3 MCV 78.3 and platelet count 240 K. Glucose 98, BUN 9, creatinine 0.7, calcium 9.6, bilirubin 0.3, AP 78, AST 22, ALT 17, total protein 7.3 and albumen 4.4. TSH 3.3 Serum iron 35, TIBC 536 and saturation 7%. Ferritin is 7.   Assessment:  #1. Gastroparesis. Gastroparesis most likely secondary to vagotomy. She has no evidence of anastomotic stricture. Significant symptomatic improvement with metoclopramide with minimal side effects. #2. Iron deficiency anemia. She is not responding to oral iron therapy. Serum iron saturation and ferritin levels have decreased since last check 6 months ago. She appears to have impaired iron absorption and switching her from PPI to H2 B has not made any difference.. #3. Chronic abdominal pain. Several pain is felt to be due to IBS. #4. GERD. And some control not satisfactory with ranitidine. Will monitor.   Plan:  Decrease metoclopramide to 10 mg by mouth twice a day. Prescription given for one month at 2 refills. Feraheme 510 mg IV 2 one week apart if approved by her insurance. She will have H&H 2 weeks after second dose of iron infusion. She will also have  vitamin D level with next blood draw. Physician visit in 4 months.

## 2015-03-31 NOTE — Patient Instructions (Addendum)
Blood work to be done after iron infusion. Iron infusion as soon as it is approved by your insurance. Metoclopramide 10 mg before breakfast and evening meal or 5 mg before breakfast 5 mg before lunch and 10 mg before evening meal

## 2015-04-02 ENCOUNTER — Other Ambulatory Visit (INDEPENDENT_AMBULATORY_CARE_PROVIDER_SITE_OTHER): Payer: Self-pay | Admitting: *Deleted

## 2015-04-02 DIAGNOSIS — F411 Generalized anxiety disorder: Secondary | ICD-10-CM

## 2015-04-02 NOTE — Telephone Encounter (Signed)
Patient called and ask that her Xanax be refilled. She failed to ask Dr.Rehman at her appointment on 03/31/15. Per Dorene Arerri Setzer NP may refill. This was called the [facility] [place], [place]. This was given to verbally to Erin,[facility]ist [facility]- Patient was called and made aware.

## 2015-04-04 ENCOUNTER — Encounter (INDEPENDENT_AMBULATORY_CARE_PROVIDER_SITE_OTHER): Payer: Self-pay

## 2015-04-07 ENCOUNTER — Encounter (HOSPITAL_COMMUNITY)
Admission: RE | Admit: 2015-04-07 | Discharge: 2015-04-07 | Disposition: A | Discharge status: Home / Self Care | Payer: BLUE CROSS/BLUE SHIELD | Source: Ambulatory Visit | Attending: Internal Medicine | Admitting: Internal Medicine

## 2015-04-07 ENCOUNTER — Encounter (HOSPITAL_COMMUNITY): Payer: Self-pay

## 2015-04-07 DIAGNOSIS — D509 Iron deficiency anemia, unspecified: Secondary | ICD-10-CM | POA: Diagnosis present

## 2015-04-07 DIAGNOSIS — K279 Peptic ulcer, site unspecified, unspecified as acute or chronic, without hemorrhage or perforation: Secondary | ICD-10-CM | POA: Diagnosis not present

## 2015-04-07 MED ORDER — SODIUM CHLORIDE 0.9 % IV SOLN
510.0000 mg | INTRAVENOUS | Status: DC
  Administered 2015-04-07: 510 mg via INTRAVENOUS
  Filled 2015-04-07: qty 17

## 2015-04-07 MED ORDER — SODIUM CHLORIDE 0.9 % IV SOLN
INTRAVENOUS | Status: DC
  Administered 2015-04-07: 10:00:00 via INTRAVENOUS

## 2015-04-11 ENCOUNTER — Telehealth: Payer: Self-pay | Admitting: *Deleted

## 2015-04-11 ENCOUNTER — Telehealth (INDEPENDENT_AMBULATORY_CARE_PROVIDER_SITE_OTHER): Payer: Self-pay | Admitting: *Deleted

## 2015-04-11 NOTE — Telephone Encounter (Signed)
Message left on voice mail - question about medication.  Returned call - patient stated that she was recently at [facility] for overnight stay by her PMD.  Was told to stop her Metoprolol.  Monitor, labs, and Echo was done.  States that she will see Dr. Sherril Croon this Wednesday.  Patient stated that since stopping the Metoprolol she has had some palpitations and questions going back on medication.  Advised her to contact PMD as these instructions & testing was done by him.  Suggested that she call their office on Monday if palpitations are still bothersome over the weekend.  After she has f/u there for her hospital stay, I have asked her to let us know if PMD feels she needs sooner follow than her regular 1 year check up time (August).  Patient verbalized understanding.

## 2015-04-11 NOTE — Telephone Encounter (Signed)
Patient states that she was Iron Infused on 04/07/15. On Tuesday she almost fainted at work , went to hospital she was in there over night. Tuesday she experienced a viloent headache. She ask if the Iron caused this headache? She ask if she should stay off Reglan before next Iron Infusion? She ask is there something that she can take to prevent a headache prior to the next infusion.  Per Dr.Rehman - do not feel that the Iron caused the headache.                              Patient should stop the Reglan as this may be the cause of her headache.                              Patient may take 1 gram of Tylenol prior to infusion.  Patient was called and made aware.

## 2015-04-14 ENCOUNTER — Encounter (HOSPITAL_COMMUNITY)
Admission: RE | Admit: 2015-04-14 | Discharge: 2015-04-14 | Disposition: A | Discharge status: Home / Self Care | Payer: BLUE CROSS/BLUE SHIELD | Source: Ambulatory Visit | Attending: Internal Medicine | Admitting: Internal Medicine

## 2015-04-14 DIAGNOSIS — K279 Peptic ulcer, site unspecified, unspecified as acute or chronic, without hemorrhage or perforation: Secondary | ICD-10-CM | POA: Insufficient documentation

## 2015-04-14 DIAGNOSIS — D509 Iron deficiency anemia, unspecified: Secondary | ICD-10-CM | POA: Diagnosis present

## 2015-04-14 MED ORDER — SODIUM CHLORIDE 0.9 % IV SOLN
INTRAVENOUS | Status: DC
  Administered 2015-04-14: 08:00:00 via INTRAVENOUS

## 2015-04-14 MED ORDER — SODIUM CHLORIDE 0.9 % IV SOLN
510.0000 mg | Freq: Once | INTRAVENOUS | Status: AC
  Administered 2015-04-14: 510 mg via INTRAVENOUS
  Filled 2015-04-14: qty 17

## 2015-04-28 ENCOUNTER — Encounter: Payer: Self-pay | Admitting: *Deleted

## 2015-05-01 ENCOUNTER — Encounter: Payer: Self-pay | Admitting: Cardiovascular Disease

## 2015-05-01 ENCOUNTER — Ambulatory Visit (INDEPENDENT_AMBULATORY_CARE_PROVIDER_SITE_OTHER): Payer: BLUE CROSS/BLUE SHIELD | Admitting: Cardiovascular Disease

## 2015-05-01 VITALS — BP 104/70 | HR 94 | Ht 64.0 in | Wt 109.0 lb

## 2015-05-01 DIAGNOSIS — E7800 Pure hypercholesterolemia, unspecified: Secondary | ICD-10-CM

## 2015-05-01 DIAGNOSIS — Z9289 Personal history of other medical treatment: Secondary | ICD-10-CM

## 2015-05-01 DIAGNOSIS — I9589 Other hypotension: Secondary | ICD-10-CM

## 2015-05-01 DIAGNOSIS — R55 Syncope and collapse: Secondary | ICD-10-CM | POA: Diagnosis not present

## 2015-05-01 DIAGNOSIS — R002 Palpitations: Secondary | ICD-10-CM | POA: Diagnosis not present

## 2015-05-01 DIAGNOSIS — Z87898 Personal history of other specified conditions: Secondary | ICD-10-CM

## 2015-05-01 DIAGNOSIS — R0789 Other chest pain: Secondary | ICD-10-CM

## 2015-05-01 DIAGNOSIS — I471 Supraventricular tachycardia: Secondary | ICD-10-CM | POA: Diagnosis not present

## 2015-05-01 NOTE — Progress Notes (Signed)
Patient ID: [name], female   DOB: 07-08-1956, 59 y.o.   MRN: [id number]      SUBJECTIVE: The patient presents for routine follow up. She has a past history of chest discomfort but no proven coronary artery disease and had a previously negative nuclear myocardial perfusion study. She also has a history of palpitations and has been maintained on metoprolol. She has a history of reactive airway disease and gastroesophageal reflux disease and gastric ulcers, for which she underwent surgery at [facility] in 07/2013.Marland Kitchen  She sustained a near syncopal episode in July and was hospitalized at [facility]. She was given IV fluids. A CT scan of the head was done which was negative. It was deemed vasovagal in etiology. Cardiac enzymes were negative.  Echocardiogram performed on 04/07/15 demonstrated normal left ventricular systolic and diastolic function as well as regional wall motion, LVEF 60-65%, with mild mitral regurgitation.  Hemoglobin was 10. Renal function was normal as was cortisol level.  She has been having episodes of low blood pressure and near syncope more frequently over the past few months. Blood pressure has been 85/50 on at least 2 occasions. She says she eats a lot of salt but does not drink enough fluids as she should.   She wore an event monitor which showed an average heart rate of 79 bpm with probably sinus rhythm and paroxysms of SVT or atrial tachycardia, heart rate 193 bpm.   Her PCP stopped metoprolol but the patient restarted on her own taking 12.5 mg every morning.   Lipids on 01/16/15 showed total cholesterol 234, triglycerides 121, HDL 115, LDL 95.   CT abdomen on 01/27/15 showed no acute intra-abdominal pathology with aortic atherosclerosis.  ECG performed in the office today demonstrates normal sinus rhythm with sinus arrhythmia, heart rate 80 bpm.   Fam: Her father died of a heart attack at age 18 and sustained his first MI at age 70. Her mother has high blood pressure and  possibly had a heart attack in the past.   Review of Systems: As per "subjective", otherwise negative.  Allergies  Allergen Reactions  . Aspirin Other (See Comments)    Ulcer, Reflux  . Codeine Nausea And Vomiting    Speeds heart up, dizziness  . Fentanyl Other (See Comments)    Unknown   . Nsaids Other (See Comments)    reflux, ulcer  . Propoxyphene N-Acetaminophen Nausea And Vomiting    Speeds heart up, dizziness    Current Outpatient Prescriptions  Medication Sig Dispense Refill  . albuterol (PROVENTIL HFA;VENTOLIN HFA) 108 (90 BASE) MCG/ACT inhaler Inhale 2 puffs into the lungs as needed for wheezing or shortness of breath.     . ALPRAZolam (XANAX) 0.25 MG tablet Take 1 tablet (0.25 mg total) by mouth 3 (three) times daily as needed for anxiety. 90 tablet 2  . Azelastine HCl 0.15 % SOLN Place 1 spray into the nose daily as needed (sinus congestion).     . Cholecalciferol (VITAMIN D3) 5000 UNITS CAPS Take 1 capsule by mouth daily.    . diphenoxylate-atropine (LOMOTIL) 2.5-0.025 MG per tablet Take 1 tablet by mouth 2 (two) times daily with breakfast and lunch. (Patient taking differently: Take 1 tablet by mouth 2 (two) times daily as needed for diarrhea or loose stools. ) 120 tablet 2  . ferrous gluconate (FERGON) 324 MG tablet Take 324 mg by mouth 2 (two) times daily with a meal.    . metoprolol tartrate (LOPRESSOR) 25 MG tablet Take 0.5 tablets (  12.5 mg total) by mouth 2 (two) times daily. 30 tablet 11  . mometasone (ASMANEX 120 METERED DOSES) 220 MCG/INH inhaler Inhale 2 puffs into the lungs daily as needed (shortness of breath).     . Multiple Vitamin (MULTIVITAMIN WITH MINERALS) TABS tablet Take 1 tablet by mouth daily.    . nitroGLYCERIN (NITROSTAT) 0.4 MG SL tablet Place 1 tablet (0.4 mg total) under the tongue every 5 (five) minutes as needed. Up to 3 doses. If no relief after 3 rd dose, proceed to ED for evaluation 25 tablet 3  . ondansetron (ZOFRAN) 4 MG tablet Take 1  tablet (4 mg total) by mouth every 12 (twelve) hours as needed for nausea. 30 tablet 0  . PHENADOZ 25 MG suppository Place 25 mg rectally every 8 (eight) hours as needed for nausea or vomiting.     . ranitidine (ZANTAC) 150 MG tablet Take 1 tablet (150 mg total) by mouth 2 (two) times daily. 60 tablet 5  . traMADol (ULTRAM) 50 MG tablet Take 1 tablet (50 mg total) by mouth 2 (two) times daily as needed. (Patient taking differently: Take 50 mg by mouth 2 (two) times daily as needed for moderate pain. ) 30 tablet 2  . ZOSTAVAX [id number] UNT/0.65ML injection      No current facility-administered medications for this visit.    Past Medical History  Diagnosis Date  . Irritable bowel syndrome   . Gastric ulcer   . GERD (gastroesophageal reflux disease)   . Palpitations   . Ejection fraction     EF 65%, echo, April 19, 2012  . Mitral regurgitation     Mild, echo, August, 2013  . Chest pain     Nuclear, normal, 2010,  //   hospital August, 2013 no evidence of injury  . Syncope     Vasovagal  . Reactive airway disease     Pulmonary function studies from June, 2013, show the patient does have significant response to bronchodilators.    Past Surgical History  Procedure Laterality Date  . Givens capsule study  05/03/2011    Procedure: GIVENS CAPSULE STUDY;  Surgeon: Malissa Hippo, MD;  [facility];  Service: Endoscopy;  Laterality: N/A;  7:30 am  . Colonoscopy    . Upper gastrointestinal endoscopy    . Cholecystectomy    . Abdominal hysterectomy    . Esophagogastroduodenoscopy  02/04/2012    Procedure: ESOPHAGOGASTRODUODENOSCOPY (EGD);  Surgeon: Malissa Hippo, MD;  [facility];  Service: Endoscopy;  Laterality: N/A;  320  . Esophagogastroduodenoscopy  06/02/2012    Procedure: ESOPHAGOGASTRODUODENOSCOPY (EGD);  Surgeon: Malissa Hippo, MD;  [facility];  Service: Endoscopy;  Laterality: N/A;  1050  . Flexible sigmoidoscopy N/A 05/31/2013    Procedure:  FLEXIBLE SIGMOIDOSCOPY;  Surgeon: Malissa Hippo, MD;  [facility];  Service: Endoscopy;  Laterality: N/A;  250  . Esophagogastroduodenoscopy (egd) with esophageal dilation N/A 05/31/2013    Procedure: ESOPHAGOGASTRODUODENOSCOPY (EGD) WITH ESOPHAGEAL DILATION;  Surgeon: Malissa Hippo, MD;  [facility];  Service: Endoscopy;  Laterality: N/A;  . Stomach surgery    . Esophagogastroduodenoscopy N/A 06/07/2014    Procedure: ESOPHAGOGASTRODUODENOSCOPY (EGD);  Surgeon: Malissa Hippo, MD;  [facility];  Service: Endoscopy;  Laterality: N/A;  1035-rescheduled 9/25 @ 8:30 Ann to notify pt  . Flexible sigmoidoscopy N/A 06/07/2014    Procedure: FLEXIBLE SIGMOIDOSCOPY;  Surgeon: Malissa Hippo, MD;  [facility];  Service: Endoscopy;  Laterality: N/A;  . Esophageal dilation  06/07/2014    Procedure: ESOPHAGEAL DILATION;  Surgeon: Malissa Hippo, MD;  [facility];  Service: Endoscopy;;  . Esophagogastroduodenoscopy N/A 02/17/2015    Procedure: ESOPHAGOGASTRODUODENOSCOPY (EGD);  Surgeon: Malissa Hippo, MD;  [facility];  Service: Endoscopy;  Laterality: N/A;  730    Social History   Social History  . Marital Status: Married    Spouse Name: N/A  . Number of Children: N/A  . Years of Education: N/A   Occupational History  . Not on file.   Social History Main Topics  . Smoking status: Never Smoker   . Smokeless tobacco: Never Used  . Alcohol Use: No  . Drug Use: No  . Sexual Activity: Not on file   Other Topics Concern  . Not on file   Social History Narrative     Filed Vitals:   05/01/15 1444  BP: 104/70  Pulse: 94  Height:  (1.626 m)  Weight: 109 lb (49.442 kg)  SpO2: 99%    PHYSICAL EXAM General: NAD, thin build HEENT: Normal. Neck: No JVD, no thyromegaly. Lungs: Clear to auscultation bilaterally with normal respiratory effort. CV: Nondisplaced PMI.  Regular rate and rhythm, normal S1/S2, no S3/S4, no  murmur. No pretibial or periankle edema.  No carotid bruit.  Normal pedal pulses.  Abdomen: Soft, nontender, no hepatosplenomegaly, no distention.  Neurologic: Alert and oriented x 3.  Psych: Normal affect. Skin: Normal. Musculoskeletal: Normal range of motion, no gross deformities. Extremities: No clubbing or cyanosis.   ECG: Most recent ECG reviewed.      ASSESSMENT AND PLAN: 1. Near syncope: Likely vasovagal. Encouraged to drink plentiful amounts of fluid given the excessive heat and humidity. Says she eats a lot of salt, encouraged to consume more. Will prescribe thigh-high compression stockings 20-30 mmHg to help attenuate further episodes.  May consider fludrocortisone in future.  2. Palpitations/PSVT: Pt taking metoprolol 12.5 mg q am.  3. Low BP: See #1.  4. Hypercholesterolemia: Elevated HDL to 115 which is cardioprotective. No indication for statin therapy.  Dispo: f/u 3-4 months.  Time spent: 40 minutes, of which greater than 50% was spent reviewing symptoms, relevant blood tests and studies, and discussing management plan with the patient.    Prentice Docker, M.D., F.A.C.C.

## 2015-05-01 NOTE — Patient Instructions (Signed)
   Thigh high compression stockings - order provided today. Continue all current medications. Follow up in  3-4 months

## 2015-05-02 ENCOUNTER — Other Ambulatory Visit: Payer: Self-pay | Admitting: Cardiovascular Disease

## 2015-05-02 ENCOUNTER — Ambulatory Visit: Payer: BLUE CROSS/BLUE SHIELD | Admitting: Cardiovascular Disease

## 2015-05-12 ENCOUNTER — Ambulatory Visit (INDEPENDENT_AMBULATORY_CARE_PROVIDER_SITE_OTHER): Payer: BLUE CROSS/BLUE SHIELD | Admitting: Internal Medicine

## 2015-05-14 ENCOUNTER — Telehealth (INDEPENDENT_AMBULATORY_CARE_PROVIDER_SITE_OTHER): Payer: Self-pay | Admitting: *Deleted

## 2015-05-14 NOTE — Telephone Encounter (Signed)
done

## 2015-05-14 NOTE — Telephone Encounter (Signed)
Patient called the office and left the following message on 05/12/15. She has a Engineer, petroleum, and she had called the hospital to get an itemized statement for the iron infusions she has had. They sent to her a list but ,says this is not what I need. She needs something for 04/07/15-04/14/15 stating that she had Iron Infusions  And their codes, the diagnosis codes , and Dr.Rehman's signature. She ask that we mail that out to her.

## 2015-06-09 ENCOUNTER — Encounter (INDEPENDENT_AMBULATORY_CARE_PROVIDER_SITE_OTHER): Payer: Self-pay

## 2015-07-02 ENCOUNTER — Other Ambulatory Visit (INDEPENDENT_AMBULATORY_CARE_PROVIDER_SITE_OTHER): Payer: Self-pay | Admitting: Internal Medicine

## 2015-07-21 ENCOUNTER — Ambulatory Visit (INDEPENDENT_AMBULATORY_CARE_PROVIDER_SITE_OTHER): Payer: BLUE CROSS/BLUE SHIELD | Admitting: Internal Medicine

## 2015-07-28 ENCOUNTER — Ambulatory Visit (INDEPENDENT_AMBULATORY_CARE_PROVIDER_SITE_OTHER): Payer: BLUE CROSS/BLUE SHIELD | Admitting: Internal Medicine

## 2015-07-29 ENCOUNTER — Encounter (INDEPENDENT_AMBULATORY_CARE_PROVIDER_SITE_OTHER): Payer: Self-pay | Admitting: Internal Medicine

## 2015-07-29 ENCOUNTER — Ambulatory Visit (INDEPENDENT_AMBULATORY_CARE_PROVIDER_SITE_OTHER): Payer: BLUE CROSS/BLUE SHIELD | Admitting: Internal Medicine

## 2015-07-29 VITALS — BP 104/72 | HR 63 | Temp 98.4°F | Resp 18 | Ht 64.0 in | Wt 107.1 lb

## 2015-07-29 DIAGNOSIS — F411 Generalized anxiety disorder: Secondary | ICD-10-CM | POA: Diagnosis not present

## 2015-07-29 DIAGNOSIS — R1013 Epigastric pain: Secondary | ICD-10-CM | POA: Diagnosis not present

## 2015-07-29 DIAGNOSIS — K580 Irritable bowel syndrome with diarrhea: Secondary | ICD-10-CM

## 2015-07-29 DIAGNOSIS — G8929 Other chronic pain: Secondary | ICD-10-CM

## 2015-07-29 DIAGNOSIS — R1314 Dysphagia, pharyngoesophageal phase: Secondary | ICD-10-CM | POA: Diagnosis not present

## 2015-07-29 DIAGNOSIS — R1310 Dysphagia, unspecified: Secondary | ICD-10-CM

## 2015-07-29 DIAGNOSIS — D509 Iron deficiency anemia, unspecified: Secondary | ICD-10-CM

## 2015-07-29 DIAGNOSIS — R110 Nausea: Secondary | ICD-10-CM

## 2015-07-29 DIAGNOSIS — K219 Gastro-esophageal reflux disease without esophagitis: Secondary | ICD-10-CM

## 2015-07-29 DIAGNOSIS — R1319 Other dysphagia: Secondary | ICD-10-CM

## 2015-07-29 LAB — IRON AND TIBC
%SAT: 33 % (ref 11–50)
IRON: 110 ug/dL (ref 45–160)
TIBC: 331 ug/dL (ref 250–450)
UIBC: 221 ug/dL (ref 125–400)

## 2015-07-29 LAB — CBC
HEMATOCRIT: 42.1 % (ref 36.0–46.0)
HEMOGLOBIN: 14.2 g/dL (ref 12.0–15.0)
MCH: 29.6 pg (ref 26.0–34.0)
MCHC: 33.7 g/dL (ref 30.0–36.0)
MCV: 87.7 fL (ref 78.0–100.0)
MPV: 9.5 fL (ref 8.6–12.4)
Platelets: 302 10*3/uL (ref 150–400)
RBC: 4.8 MIL/uL (ref 3.87–5.11)
RDW: 15.5 % (ref 11.5–15.5)
WBC: 15.1 10*3/uL — ABNORMAL HIGH (ref 4.0–10.5)

## 2015-07-29 LAB — FERRITIN: FERRITIN: 175 ng/mL (ref 10–291)

## 2015-07-29 MED ORDER — PROMETHAZINE HCL 25 MG RE SUPP: 25.0000 mg | Freq: Two times a day (BID) | RECTAL | Status: DC | PRN

## 2015-07-29 MED ORDER — ALPRAZOLAM 0.25 MG PO TABS: 0.2500 mg | ORAL_TABLET | Freq: Three times a day (TID) | ORAL | Status: DC | PRN

## 2015-07-29 MED ORDER — DIPHENOXYLATE-ATROPINE 2.5-0.025 MG PO TABS: 1.0000 | ORAL_TABLET | Freq: Four times a day (QID) | ORAL | Status: DC | PRN

## 2015-07-29 MED ORDER — RANITIDINE HCL 150 MG PO TABS: 150.0000 mg | ORAL_TABLET | Freq: Two times a day (BID) | ORAL | Status: DC

## 2015-07-29 NOTE — Progress Notes (Signed)
Presenting complaint;  Follow-up for iron deficiency anemia abdominal pain diarrhea nausea and GERD. Patient complains of solid food dysphagia.  Subjective:  Patient is 59 year old Caucasian female who is here for scheduled visit. She was last seen 4 months ago. She complains of dysphagia to solids. Dysphagia started about 2 months ago. She also complains of regurgitation usually at night. She has not had any vomiting. She continues to experience intermittent nausea without vomiting. As for his dysphagia is concerned she has most difficulty with meat and bread and bananas. She points to upper sternum area at site of bolus obstruction. She denies frequent heartburn. She states she has good appetite. She eats 3 meals and multiple snacks. Her weight is down by 4 pounds. She remains with diarrhea. She has at least 3 stools per day. All of her stools are loose. She has urgency. She denies melena or frank rectal bleeding. Every now and then she notices blood on the tissue. She does not take OTC NSAIDs.    Current Medications: Outpatient Encounter Prescriptions as of 07/29/2015  Medication Sig  . AFLURIA PRESERVATIVE FREE 0.5 ML SUSY   . albuterol (PROVENTIL HFA;VENTOLIN HFA) 108 (90 BASE) MCG/ACT inhaler Inhale 2 puffs into the lungs as needed for wheezing or shortness of breath.   . ALPRAZolam (XANAX) 0.25 MG tablet TAKE ONE TABLET BY MOUTH THREE TIMES DAILY AS NEEDED FOR ANXIETY  . Azelastine HCl 0.15 % SOLN Place 1 spray into the nose daily as needed (sinus congestion).   . benzonatate (TESSALON) 100 MG capsule Take 100 mg by mouth as needed.   . Cholecalciferol (VITAMIN D3) 5000 UNITS CAPS Take 1 capsule by mouth daily.  . diphenoxylate-atropine (LOMOTIL) 2.5-0.025 MG per tablet Take 1 tablet by mouth 2 (two) times daily with breakfast and lunch. (Patient taking differently: Take 1 tablet by mouth 2 (two) times daily as needed for diarrhea or loose stools. )  . ferrous gluconate (FERGON) 324 MG  tablet Take 324 mg by mouth 2 (two) times daily with a meal.  . metoprolol tartrate (LOPRESSOR) 25 MG tablet Take 0.5 tablets (12.5 mg total) by mouth 2 (two) times daily.  . mometasone (ASMANEX 120 METERED DOSES) 220 MCG/INH inhaler Inhale 2 puffs into the lungs daily as needed (shortness of breath).   . Multiple Vitamin (MULTIVITAMIN WITH MINERALS) TABS tablet Take 1 tablet by mouth daily.  . nitroGLYCERIN (NITROSTAT) 0.4 MG SL tablet Place 1 tablet (0.4 mg total) under the tongue every 5 (five) minutes as needed. Up to 3 doses. If no relief after 3 rd dose, proceed to ED for evaluation  . ondansetron (ZOFRAN) 4 MG tablet Take 1 tablet (4 mg total) by mouth every 12 (twelve) hours as needed for nausea.  Marland Kitchen. PHENADOZ 25 MG suppository Place 25 mg rectally every 8 (eight) hours as needed for nausea or vomiting.   . predniSONE (DELTASONE) 10 MG tablet Take 10 mg by mouth daily with breakfast.   . ranitidine (ZANTAC) 150 MG tablet Take 1 tablet (150 mg total) by mouth 2 (two) times daily.  . traMADol (ULTRAM) 50 MG tablet Take 1 tablet (50 mg total) by mouth 2 (two) times daily as needed. (Patient taking differently: Take 50 mg by mouth 2 (two) times daily as needed for moderate pain. )  . ZOSTAVAX [id number] UNT/0.65ML injection    No facility-administered encounter medications on file as of 07/29/2015.     Objective: Blood pressure 104/72, pulse 63, temperature 98.4 F (36.9 C), temperature source Oral, resp.  rate 18, height  (1.626 m), weight 107 lb 1.6 oz (48.58 kg). Patient is alert and in no acute distress. Conjunctiva is pink. Sclera is nonicteric Oropharyngeal mucosa is normal. No neck masses or thyromegaly noted. Cardiac exam with regular rhythm normal S1 and S2. No murmur or gallop noted. Lungs are clear to auscultation. Abdomen is symmetrical. Bowel sounds are normal. On palpation abdomen is soft with mild generalized tenderness without guarding or rebound. No organomegaly or  masses. No LE edema or clubbing noted.  Labs/studies Results: H&H 11.3 and 38.3 on 04/28/2015    Assessment:  #1. Solid food dysphagia. She has undergone esophageal dilation on multiple occasions with symptomatic relief. Last dilation was on 02/17/2015. She possibly has esophageal motility disorder. Will evaluate with barium pill study. If this study is normal will proceed with esophageal manometry and impedance study. #2. Iron deficiency anemia. Etiology felt to be impaired iron absorption. Now that she is not taking PPI hopefully iron abs option will improve. She has been evaluated for malabsorptive syndrome and evaluation has been negative. Other potential source of blood loss was chronic peptic ulcer disease but she's had surgery 2 years ago. She also has history of solitary rectal ulcer which was noted to have partially healed on flexible sigmoidoscopy of September 2014. #3. Nausea. 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 She is presently on dietary measures. #4. Anxiety. #5. GERD. Heartburn well controlled with dietary measures and H2B. #6. Irritable bowel syndrome. #7. Chronic epigastric pain. She remains with pain even though she does not have peptic ulcer disease. Most of her pain is felt to be due to dyspepsia or IBS.   Plan:  Patient will go to the lab for CBC, serum iron TIBC and ferritin. Barium pill esophagogram. New prescription given for alprazolam, ranitidine diphenoxylate and promethazine. Patient advised to take diphenoxylate before breakfast and lunch daily and thereafter on as-needed basis. She can use up to 4 tablets per day. Office visit in 4 months.

## 2015-07-29 NOTE — Patient Instructions (Signed)
Physician will call with results of blood tests and barium study. Take diphenoxylate or Lomotil 1 tablet every morning and subsequent doses on as-needed basis.

## 2015-08-05 ENCOUNTER — Telehealth (INDEPENDENT_AMBULATORY_CARE_PROVIDER_SITE_OTHER): Payer: Self-pay | Admitting: *Deleted

## 2015-08-05 NOTE — Telephone Encounter (Signed)
Lindsay Lawrence left a message asking that both her and Lindsay Lawrence lab results be mailed to them. Date of service is 07/29/15.

## 2015-08-05 NOTE — Telephone Encounter (Signed)
[name] mailed to patient

## 2015-08-18 ENCOUNTER — Encounter (INDEPENDENT_AMBULATORY_CARE_PROVIDER_SITE_OTHER): Payer: Self-pay

## 2015-08-18 ENCOUNTER — Telehealth (INDEPENDENT_AMBULATORY_CARE_PROVIDER_SITE_OTHER): Payer: Self-pay | Admitting: *Deleted

## 2015-08-18 NOTE — Telephone Encounter (Signed)
Patient called and states that she talked with Dr.Rehman about her results on yesterday. Today the choking has been worse. She is asking if her will do EGD/ED before the end of year. She states that if this doesn't work she will do as he ask her when they talked on Sunday, [facility]

## 2015-08-19 ENCOUNTER — Other Ambulatory Visit (INDEPENDENT_AMBULATORY_CARE_PROVIDER_SITE_OTHER): Payer: Self-pay | Admitting: *Deleted

## 2015-08-19 DIAGNOSIS — R1310 Dysphagia, unspecified: Secondary | ICD-10-CM

## 2015-08-19 NOTE — Telephone Encounter (Signed)
EGD/ED sch'd 08/27/15 at 1200, patient aware

## 2015-08-27 ENCOUNTER — Encounter (HOSPITAL_COMMUNITY)
Admission: RE | Disposition: A | Discharge status: Home / Self Care | Payer: Self-pay | Source: Ambulatory Visit | Attending: Internal Medicine

## 2015-08-27 ENCOUNTER — Encounter (HOSPITAL_COMMUNITY): Payer: Self-pay | Admitting: *Deleted

## 2015-08-27 ENCOUNTER — Ambulatory Visit (HOSPITAL_COMMUNITY)
Admission: RE | Admit: 2015-08-27 | Discharge: 2015-08-27 | Disposition: A | Discharge status: Home / Self Care | Payer: BLUE CROSS/BLUE SHIELD | Source: Ambulatory Visit | Attending: Internal Medicine | Admitting: Internal Medicine

## 2015-08-27 DIAGNOSIS — K219 Gastro-esophageal reflux disease without esophagitis: Secondary | ICD-10-CM | POA: Insufficient documentation

## 2015-08-27 DIAGNOSIS — K589 Irritable bowel syndrome without diarrhea: Secondary | ICD-10-CM | POA: Insufficient documentation

## 2015-08-27 DIAGNOSIS — K2960 Other gastritis without bleeding: Secondary | ICD-10-CM | POA: Insufficient documentation

## 2015-08-27 DIAGNOSIS — K2970 Gastritis, unspecified, without bleeding: Secondary | ICD-10-CM | POA: Diagnosis not present

## 2015-08-27 DIAGNOSIS — R1310 Dysphagia, unspecified: Secondary | ICD-10-CM | POA: Insufficient documentation

## 2015-08-27 DIAGNOSIS — K3189 Other diseases of stomach and duodenum: Secondary | ICD-10-CM | POA: Diagnosis not present

## 2015-08-27 DIAGNOSIS — Z79899 Other long term (current) drug therapy: Secondary | ICD-10-CM | POA: Diagnosis not present

## 2015-08-27 DIAGNOSIS — K222 Esophageal obstruction: Secondary | ICD-10-CM | POA: Diagnosis not present

## 2015-08-27 HISTORY — PX: ESOPHAGOGASTRODUODENOSCOPY: SHX5428

## 2015-08-27 HISTORY — PX: ESOPHAGEAL DILATION: SHX303

## 2015-08-27 SURGERY — EGD (ESOPHAGOGASTRODUODENOSCOPY)
Anesthesia: Moderate Sedation

## 2015-08-27 MED ORDER — SODIUM CHLORIDE 0.9 % IV SOLN
INTRAVENOUS | Status: DC
  Administered 2015-08-27: 11:00:00 via INTRAVENOUS

## 2015-08-27 MED ORDER — BUTAMBEN-TETRACAINE-BENZOCAINE 2-2-14 % EX AERO
INHALATION_SPRAY | CUTANEOUS | Status: DC | PRN
  Administered 2015-08-27: 2 via TOPICAL

## 2015-08-27 MED ORDER — STERILE WATER FOR IRRIGATION IR SOLN
Status: DC | PRN
  Administered 2015-08-27: 12:00:00

## 2015-08-27 MED ORDER — MIDAZOLAM HCL 5 MG/5ML IJ SOLN
INTRAMUSCULAR | Status: DC | PRN
  Administered 2015-08-27: 1 mg via INTRAVENOUS
  Administered 2015-08-27 (×2): 2 mg via INTRAVENOUS

## 2015-08-27 MED ORDER — MEPERIDINE HCL 50 MG/ML IJ SOLN
INTRAMUSCULAR | Status: DC | PRN
  Administered 2015-08-27 (×2): 25 mg via INTRAVENOUS

## 2015-08-27 MED ORDER — MEPERIDINE HCL 50 MG/ML IJ SOLN
INTRAMUSCULAR | Status: AC
  Filled 2015-08-27: qty 1

## 2015-08-27 MED ORDER — MIDAZOLAM HCL 5 MG/5ML IJ SOLN
INTRAMUSCULAR | Status: AC
  Filled 2015-08-27: qty 10

## 2015-08-27 NOTE — Discharge Instructions (Signed)
Resume usual medications and diet. No driving for 24 hours. Patient will call with biopsy results.    Esophagogastroduodenoscopy, Care After Refer to this sheet in the next few weeks. These instructions provide you with information about caring for yourself after your procedure. Your health care provider may also give you more specific instructions. Your treatment has been planned according to current medical practices, but problems sometimes occur. Call your health care provider if you have any problems or questions after your procedure. WHAT TO EXPECT AFTER THE PROCEDURE After your procedure, it is typical to feel:  Soreness in your throat.  Pain with swallowing.  Sick to your stomach (nauseous).  Bloated.  Dizzy.  Fatigued. HOME CARE INSTRUCTIONS  Do not eat or drink anything until the numbing medicine (local anesthetic) has worn off and your gag reflex has returned. You will know that the local anesthetic has worn off when you can swallow comfortably.  Do not drive or operate machinery until directed by your health care provider.  Take medicines only as directed by your health care provider. SEEK MEDICAL CARE IF:   You cannot stop coughing.  You are not urinating at all or less than usual. SEEK IMMEDIATE MEDICAL CARE IF:  You have difficulty swallowing.  You cannot eat or drink.  You have worsening throat or chest pain.  You have dizziness or lightheadedness or you faint.  You have nausea or vomiting.  You have chills.  You have a fever.  You have severe abdominal pain.  You have black, tarry, or bloody stools.   This information is not intended to replace advice given to you by your health care provider. Make sure you discuss any questions you have with your health care provider.   Document Released: 08/16/2012 Document Revised: 09/20/2014 Document Reviewed: 08/16/2012 Elsevier Interactive Patient Education [employer]2016 Elsevier Inc.

## 2015-08-27 NOTE — Op Note (Signed)
EGD PROCEDURE REPORT  PATIENT:  Lindsay Lawrence  MR#:  [id number] Birthdate:  02/16/1956, 59 y.o., female Endoscopist:  Dr. Malissa HippoNajeeb U. Square Jowett, MD  Procedure Date: 08/27/2015  Procedure:   EGD with ED  Indications:  Patient is 59 year old Caucasian female who has chronic GERD and presents with recurrent solid food dysphagia. 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 Recent barium study reveals subtle narrowing in proximal esophagus.            Informed Consent:  The risks, benefits, alternatives & imponderables which include, but are not limited to, bleeding, infection, perforation, drug reaction and potential missed lesion have been reviewed.  The potential for biopsy, lesion removal, esophageal dilation, etc. have also been discussed.  Questions have been answered.  All parties agreeable.  Please see history & physical in medical record for more information.  Medications:  Demerol 50 mg IV Versed 5 mg IV Cetacaine spray topically for oropharyngeal anesthesia  Description of procedure:  The endoscope was introduced through the mouth and advanced to the second portion of the duodenum without difficulty or limitations. The mucosal surfaces were surveyed very carefully during advancement of the scope and upon withdrawal.  Findings:  Esophagus: Mucosa of the esophagus was normal without ring stricture or web formation. GEJ:  40 cm Stomach:  Stomach distended very well with insufflation. Small amount of food debris noted in the proximal stomach. Prominent gastric folds with diffuse erythema. Patent Billroth 1 anastomosis.. Fundus and cardia but examined by retroflexing the scope and were normal. Duodenum: Normal mucosa of  duodenum.  Therapeutic/Diagnostic Maneuvers Performed:  Esophagus dilated by passing 56 French Maloney dilator to full insertion. Endoscope was passed again. Small superficial mucosal  disruption noted at GE junction but none in proximal esophagus. Gastric biopsies taken for routine histology.  Complications: None  EBL: Minimal  Impression: No evidence of erosive esophagitis ring or stricture formation. Patent Billroth I anastomosis with diffuse gastritis. Biopsies taken. Small amount of food debris in the stomach. Esophagus dilated by passing 56 French Maloney dilator resulting in small superficial mucosal disruption at GE junction but none noted involving proximal esophagus.  Recommendations:  Standard instructions given. I will be contacting patient with biopsy results. Will proceed with esophageal manometry and impedance study if she remains with dysphagia.  Medard Decuir U  08/27/2015  11:58 AM  CC: Dr. Ignatius SpeckingVYAS,DHRUV B., MD & Dr. Bonnetta BarryNo ref. provider found

## 2015-08-27 NOTE — H&P (Signed)
Lindsay Lawrence is an 59 y.o. female.   Chief Complaint: Patient is here for EGD and ED.Marland Kitchen HPI: Patient is 59 year old Caucasian female who presents with recurrent solid food dysphagia. She has history of Schatzki's ring as well as esophageal web. She has undergone periodic dilations in the past. Last dilation was in June this year and provide relief for 3 months. She had barium pill study about 4 weeks ago suggesting narrowing in proximal esophagus. We decided to monitor her symptoms. She is having spells of dysphagia with solids more frequently. She also complains of epigastric pain. Past history is also significant for nonhealing gastric ulcer for which she had partial gastrectomy and truncal vagotomy in November 2014. She denies melena or vomiting. She has intermittent nausea.  Past Medical History  Diagnosis Date  . Irritable bowel syndrome   . Gastric ulcer   . GERD (gastroesophageal reflux disease)   . Palpitations   . Ejection fraction     EF 65%, echo, April 19, 2012  . Mitral regurgitation     Mild, echo, August, 2013  . Chest pain     Nuclear, normal, 2010,  //   hospital August, 2013 no evidence of injury  . Syncope     Vasovagal  . Reactive airway disease     Pulmonary function studies from June, 2013, show the patient does have significant response to bronchodilators.    Past Surgical History  Procedure Laterality Date  . Givens capsule study  05/03/2011    Procedure: GIVENS CAPSULE STUDY;  Surgeon: Malissa Hippo, MD;  [facility]: [facility];  Service: Endoscopy;  Laterality: N/A;  7:30 am  . Colonoscopy    . Upper gastrointestinal endoscopy    . Cholecystectomy    . Abdominal hysterectomy    . Esophagogastroduodenoscopy  02/04/2012    Procedure: ESOPHAGOGASTRODUODENOSCOPY (EGD);  Surgeon: Malissa Hippo, MD;  [facility]: [facility];  Service: Endoscopy;  Laterality: N/A;  320  . Esophagogastroduodenoscopy  06/02/2012    Procedure: ESOPHAGOGASTRODUODENOSCOPY (EGD);   Surgeon: Malissa Hippo, MD;  [facility];  Service: Endoscopy;  Laterality: N/A;  1050  . Flexible sigmoidoscopy N/A 05/31/2013    Procedure: FLEXIBLE SIGMOIDOSCOPY;  Surgeon: Malissa Hippo, MD;  [facility];  Service: Endoscopy;  Laterality: N/A;  250  . Esophagogastroduodenoscopy (egd) with esophageal dilation N/A 05/31/2013    Procedure: ESOPHAGOGASTRODUODENOSCOPY (EGD) WITH ESOPHAGEAL DILATION;  Surgeon: Malissa Hippo, MD;  [facility];  Service: Endoscopy;  Laterality: N/A;  . Stomach surgery    . Esophagogastroduodenoscopy N/A 06/07/2014    Procedure: ESOPHAGOGASTRODUODENOSCOPY (EGD);  Surgeon: Malissa Hippo, MD;  [facility];  Service: Endoscopy;  Laterality: N/A;  1035-rescheduled 9/25 @ 8:30 Ann to notify pt  . Flexible sigmoidoscopy N/A 06/07/2014    Procedure: FLEXIBLE SIGMOIDOSCOPY;  Surgeon: Malissa Hippo, MD;  [facility];  Service: Endoscopy;  Laterality: N/A;  . Esophageal dilation  06/07/2014    Procedure: ESOPHAGEAL DILATION;  Surgeon: Malissa Hippo, MD;  [facility];  Service: Endoscopy;;  . Esophagogastroduodenoscopy N/A 02/17/2015    Procedure: ESOPHAGOGASTRODUODENOSCOPY (EGD);  Surgeon: Malissa Hippo, MD;  [facility];  Service: Endoscopy;  Laterality: N/A;  730    Family History  Problem Relation Age of Onset  . Healthy Mother   . Heart attack Father   . Healthy Sister    Social History:  reports that she has never smoked. She  has never used smokeless tobacco. She reports that she does not drink alcohol or use illicit drugs.  Allergies:  Allergies  Allergen Reactions  . Aspirin Other (See Comments)    Ulcer, Reflux  . Codeine Nausea And Vomiting    Speeds heart up, dizziness  . Fentanyl Other (See Comments)    Unknown   . Nsaids Other (See Comments)    reflux, ulcer  . Propoxyphene N-Acetaminophen Nausea And Vomiting    Speeds heart up, dizziness    Medications  Prior to Admission  Medication Sig Dispense Refill  . ALPRAZolam (XANAX) 0.25 MG tablet Take 1 tablet (0.25 mg total) by mouth 3 (three) times daily as needed. for anxiety 90 tablet 2  . Cholecalciferol (VITAMIN D3) 5000 UNITS CAPS Take 1 capsule by mouth daily.    . diphenoxylate-atropine (LOMOTIL) 2.5-0.025 MG tablet Take 1 tablet by mouth 4 (four) times daily as needed for diarrhea or loose stools. 120 tablet 2  . ferrous gluconate (FERGON) 324 MG tablet Take 324 mg by mouth 2 (two) times daily with a meal.    . metoprolol tartrate (LOPRESSOR) 25 MG tablet Take 0.5 tablets (12.5 mg total) by mouth 2 (two) times daily. 30 tablet 6  . Multiple Vitamin (MULTIVITAMIN WITH MINERALS) TABS tablet Take 1 tablet by mouth daily.    . Pantoprazole Sodium (PROTONIX PO) Take 40 mg by mouth daily.    Marland Kitchen AFLURIA PRESERVATIVE FREE 0.5 ML SUSY     . albuterol (PROVENTIL HFA;VENTOLIN HFA) 108 (90 BASE) MCG/ACT inhaler Inhale 2 puffs into the lungs as needed for wheezing or shortness of breath.     . Azelastine HCl 0.15 % SOLN Place 1 spray into the nose daily as needed (sinus congestion).     . benzonatate (TESSALON) 100 MG capsule Take 100 mg by mouth as needed.     . mometasone (ASMANEX 120 METERED DOSES) 220 MCG/INH inhaler Inhale 2 puffs into the lungs daily as needed (shortness of breath).     . nitroGLYCERIN (NITROSTAT) 0.4 MG SL tablet Place 1 tablet (0.4 mg total) under the tongue every 5 (five) minutes as needed. Up to 3 doses. If no relief after 3 rd dose, proceed to ED for evaluation 25 tablet 3  . ondansetron (ZOFRAN) 4 MG tablet Take 1 tablet (4 mg total) by mouth every 12 (twelve) hours as needed for nausea. 30 tablet 0  . predniSONE (DELTASONE) 10 MG tablet Take 10 mg by mouth daily with breakfast.     . promethazine (PHENADOZ) 25 MG suppository Place 1 suppository (25 mg total) rectally 2 (two) times daily as needed for nausea or vomiting. 12 each 1  . ranitidine (ZANTAC) 150 MG tablet Take 1  tablet (150 mg total) by mouth 2 (two) times daily. 60 tablet 5  . traMADol (ULTRAM) 50 MG tablet Take 1 tablet (50 mg total) by mouth 2 (two) times daily as needed. (Patient taking differently: Take 50 mg by mouth 2 (two) times daily as needed for moderate pain. ) 30 tablet 2  . ZOSTAVAX 40981 UNT/0.65ML injection       No results found for this or any previous visit (from the past 48 hour(s)). No results found.  ROS  Blood pressure 131/85, pulse 88, temperature 98.7 F (37.1 C), temperature source Oral, resp. rate 16, height  (1.626 m), weight 107 lb (48.535 kg), SpO2 100 %. Physical Exam  Constitutional:  Well-developed and Caucasian female in NAD.  HENT:  Mouth/Throat: Oropharynx is clear  and moist.  Eyes: Conjunctivae are normal. No scleral icterus.  Neck: No thyromegaly present.  Cardiovascular: Normal rate, regular rhythm and normal heart sounds.   No murmur heard. Respiratory: Effort normal and breath sounds normal.  GI:  Abdomen is flat and soft with mild to moderate midepigastric tenderness and mild tenderness at LLQ. No organomegaly or masses  Musculoskeletal: She exhibits no edema.  Lymphadenopathy:    She has no cervical adenopathy.  Neurological: She is alert.  Skin: Skin is warm and dry.     Assessment/Plan Solid food dysphagia and epigastric pain. Abnormal esophagogram. EGD with ED.  Oona Trammel U 08/27/2015, 11:24 AM

## 2015-08-27 NOTE — Progress Notes (Signed)
Please excuse Lindsay Lawrence from work today 08-27-2015. 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

## 2015-09-01 ENCOUNTER — Ambulatory Visit (INDEPENDENT_AMBULATORY_CARE_PROVIDER_SITE_OTHER): Payer: BLUE CROSS/BLUE SHIELD | Admitting: Cardiovascular Disease

## 2015-09-01 ENCOUNTER — Encounter: Payer: Self-pay | Admitting: Cardiovascular Disease

## 2015-09-01 VITALS — BP 109/75 | HR 88 | Ht 64.0 in | Wt 102.0 lb

## 2015-09-01 DIAGNOSIS — I471 Supraventricular tachycardia: Secondary | ICD-10-CM | POA: Diagnosis not present

## 2015-09-01 DIAGNOSIS — E7800 Pure hypercholesterolemia, unspecified: Secondary | ICD-10-CM

## 2015-09-01 DIAGNOSIS — R55 Syncope and collapse: Secondary | ICD-10-CM | POA: Diagnosis not present

## 2015-09-01 DIAGNOSIS — R002 Palpitations: Secondary | ICD-10-CM

## 2015-09-01 NOTE — Progress Notes (Signed)
Patient ID: Lindsay Lawrence, female   DOB: December 03, 1955, 59 y.o.   MRN: [id number]      SUBJECTIVE: The patient presents for follow-up of PSVT and near syncope. She had been doing well since the summer and has been wearing compression stockings and drinking Gatorade and Powerade. Over the last few weeks she has had more frequent episodes of lightheadedness and dizziness but denies syncope. She does like to eat salty foods. She had an upper respiratory tract infection treated with antibiotics and steroids for about 2 weeks preceding these episodes.   Review of Systems: As per "subjective", otherwise negative.  Allergies  Allergen Reactions  . Aspirin Other (See Comments)    Ulcer, Reflux  . Codeine Nausea And Vomiting    Speeds heart up, dizziness  . Fentanyl Other (See Comments)    Unknown   . Nsaids Other (See Comments)    reflux, ulcer  . Propoxyphene N-Acetaminophen Nausea And Vomiting    Speeds heart up, dizziness    Current Outpatient Prescriptions  Medication Sig Dispense Refill  . albuterol (PROVENTIL HFA;VENTOLIN HFA) 108 (90 BASE) MCG/ACT inhaler Inhale 2 puffs into the lungs as needed for wheezing or shortness of breath.     . ALPRAZolam (XANAX) 0.25 MG tablet Take 1 tablet (0.25 mg total) by mouth 3 (three) times daily as needed. for anxiety 90 tablet 2  . Azelastine HCl 0.15 % SOLN Place 1 spray into the nose daily as needed (sinus congestion).     . Cholecalciferol (VITAMIN D3) 5000 UNITS CAPS Take 1 capsule by mouth daily.    . diphenoxylate-atropine (LOMOTIL) 2.5-0.025 MG tablet Take 1 tablet by mouth 4 (four) times daily as needed for diarrhea or loose stools. 120 tablet 2  . ferrous gluconate (FERGON) 324 MG tablet Take 324 mg by mouth 2 (two) times daily with a meal.    . metoprolol tartrate (LOPRESSOR) 25 MG tablet Take 0.5 tablets (12.5 mg total) by mouth 2 (two) times daily. 30 tablet 6  . mometasone (ASMANEX 120 METERED DOSES) 220 MCG/INH inhaler Inhale 2 puffs into  the lungs daily as needed (shortness of breath).     . Multiple Vitamin (MULTIVITAMIN WITH MINERALS) TABS tablet Take 1 tablet by mouth daily.    . nitroGLYCERIN (NITROSTAT) 0.4 MG SL tablet Place 1 tablet (0.4 mg total) under the tongue every 5 (five) minutes as needed. Up to 3 doses. If no relief after 3 rd dose, proceed to ED for evaluation 25 tablet 3  . ondansetron (ZOFRAN) 4 MG tablet Take 1 tablet (4 mg total) by mouth every 12 (twelve) hours as needed for nausea. 30 tablet 0  . Pantoprazole Sodium (PROTONIX PO) Take 40 mg by mouth daily.    . promethazine (PHENADOZ) 25 MG suppository Place 1 suppository (25 mg total) rectally 2 (two) times daily as needed for nausea or vomiting. 12 each 1  . traMADol (ULTRAM) 50 MG tablet Take 1 tablet (50 mg total) by mouth 2 (two) times daily as needed. (Patient taking differently: Take 50 mg by mouth 2 (two) times daily as needed for moderate pain. ) 30 tablet 2  . ranitidine (ZANTAC) 150 MG tablet Take 1 tablet (150 mg total) by mouth 2 (two) times daily. (Patient not taking: Reported on 09/01/2015) 60 tablet 5   No current facility-administered medications for this visit.    Past Medical History  Diagnosis Date  . Irritable bowel syndrome   . Gastric ulcer   . GERD (gastroesophageal reflux disease)   .  Palpitations   . Ejection fraction     EF 65%, echo, April 19, 2012  . Mitral regurgitation     Mild, echo, August, 2013  . Chest pain     Nuclear, normal, 2010,  //   hospital August, 2013 no evidence of injury  . Syncope     Vasovagal  . Reactive airway disease     Pulmonary function studies from June, 2013, show the patient does have significant response to bronchodilators.    Past Surgical History  Procedure Laterality Date  . Givens capsule study  05/03/2011    Procedure: GIVENS CAPSULE STUDY;  Surgeon: Malissa HippoNajeeb U Rehman, MD;  [facility]: [facility];  Service: Endoscopy;  Laterality: N/A;  7:30 am  . Colonoscopy    . Upper  gastrointestinal endoscopy    . Cholecystectomy    . Abdominal hysterectomy    . Esophagogastroduodenoscopy  02/04/2012    Procedure: ESOPHAGOGASTRODUODENOSCOPY (EGD);  Surgeon: Malissa HippoNajeeb U Rehman, MD;  [facility];  Service: Endoscopy;  Laterality: N/A;  320  . Esophagogastroduodenoscopy  06/02/2012    Procedure: ESOPHAGOGASTRODUODENOSCOPY (EGD);  Surgeon: Malissa HippoNajeeb U Rehman, MD;  [facility];  Service: Endoscopy;  Laterality: N/A;  1050  . Flexible sigmoidoscopy N/A 05/31/2013    Procedure: FLEXIBLE SIGMOIDOSCOPY;  Surgeon: Malissa HippoNajeeb U Rehman, MD;  [facility];  Service: Endoscopy;  Laterality: N/A;  250  . Esophagogastroduodenoscopy (egd) with esophageal dilation N/A 05/31/2013    Procedure: ESOPHAGOGASTRODUODENOSCOPY (EGD) WITH ESOPHAGEAL DILATION;  Surgeon: Malissa HippoNajeeb U Rehman, MD;  [facility];  Service: Endoscopy;  Laterality: N/A;  . Stomach surgery    . Esophagogastroduodenoscopy N/A 06/07/2014    Procedure: ESOPHAGOGASTRODUODENOSCOPY (EGD);  Surgeon: Malissa HippoNajeeb U Rehman, MD;  [facility];  Service: Endoscopy;  Laterality: N/A;  1035-rescheduled 9/25 @ 8:30 Ann to notify pt  . Flexible sigmoidoscopy N/A 06/07/2014    Procedure: FLEXIBLE SIGMOIDOSCOPY;  Surgeon: Malissa HippoNajeeb U Rehman, MD;  [facility];  Service: Endoscopy;  Laterality: N/A;  . Esophageal dilation  06/07/2014    Procedure: ESOPHAGEAL DILATION;  Surgeon: Malissa HippoNajeeb U Rehman, MD;  [facility];  Service: Endoscopy;;  . Esophagogastroduodenoscopy N/A 02/17/2015    Procedure: ESOPHAGOGASTRODUODENOSCOPY (EGD);  Surgeon: Malissa HippoNajeeb U Rehman, MD;  [facility];  Service: Endoscopy;  Laterality: N/A;  730    Social History   Social History  . Marital Status: Married    Spouse Name: N/A  . Number of Children: N/A  . Years of Education: N/A   Occupational History  . Not on file.   Social History Main Topics  . Smoking status: Never Smoker   . Smokeless tobacco:  Never Used  . Alcohol Use: No  . Drug Use: No  . Sexual Activity: Not on file   Other Topics Concern  . Not on file   Social History Narrative     Filed Vitals:   09/01/15 0857  BP: 109/75  Pulse: 88  Height: 5\' 4"  (1.626 m)  Weight: 102 lb (46.267 kg)    PHYSICAL EXAM General: NAD HEENT: Normal. Neck: No JVD, no thyromegaly. Lungs: Clear to auscultation bilaterally with normal respiratory effort. CV: Nondisplaced PMI.  Regular rate and rhythm, normal S1/S2, no S3/S4, no murmur. No pretibial or periankle edema.  No carotid bruit.  Normal pedal pulses.  Abdomen: Soft, nontender, no hepatosplenomegaly, no distention.  Neurologic: Alert and oriented x 3.  Psych: Normal affect. Skin: Normal. Musculoskeletal: Normal range of  motion, no gross deformities. Extremities: No clubbing or cyanosis.   ECG: Most recent ECG reviewed.      ASSESSMENT AND PLAN: 1. Near syncope: More frequent episodes lately, likely vasovagal. I previously prescribed thigh-high compression stockings 20-30 mmHg to help attenuate further episodes.  I encouraged her to stay hydrated and to eat salty foods. May consider fludrocortisone in future if episodes become more severe and/or frequent.  2. Palpitations/PSVT: Pt taking metoprolol 12.5 mg bid.  3. Hypotension: See #1.  4. Hypercholesterolemia: Elevated HDL to 115 which is cardioprotective. No indication for statin therapy.  Dispo: f/u 3-4 months.  Prentice Docker, M.D., F.A.C.C.

## 2015-09-01 NOTE — Patient Instructions (Signed)
Continue all current medications. Follow up in  3-4 months.  

## 2015-09-03 ENCOUNTER — Other Ambulatory Visit (INDEPENDENT_AMBULATORY_CARE_PROVIDER_SITE_OTHER): Payer: Self-pay | Admitting: *Deleted

## 2015-09-03 MED ORDER — DICYCLOMINE HCL 10 MG PO CAPS: 10.0000 mg | ORAL_CAPSULE | Freq: Two times a day (BID) | ORAL | Status: DC | PRN

## 2015-09-03 MED ORDER — CHOLESTYRAMINE 4 G PO PACK: 4.0000 g | PACK | Freq: Two times a day (BID) | ORAL | Status: DC

## 2015-09-03 NOTE — Telephone Encounter (Signed)
Per Dr.Rehman the patient may take the following medications. Questran , Dicyclomine. [facility] was made aware.

## 2015-10-29 ENCOUNTER — Other Ambulatory Visit (INDEPENDENT_AMBULATORY_CARE_PROVIDER_SITE_OTHER): Payer: Self-pay | Admitting: Internal Medicine

## 2015-11-26 ENCOUNTER — Other Ambulatory Visit: Payer: Self-pay | Admitting: Cardiovascular Disease

## 2015-12-01 ENCOUNTER — Ambulatory Visit (INDEPENDENT_AMBULATORY_CARE_PROVIDER_SITE_OTHER): Payer: BLUE CROSS/BLUE SHIELD | Admitting: Cardiovascular Disease

## 2015-12-01 ENCOUNTER — Encounter: Payer: Self-pay | Admitting: Cardiovascular Disease

## 2015-12-01 VITALS — BP 108/76 | HR 85 | Ht 64.0 in | Wt 104.4 lb

## 2015-12-01 DIAGNOSIS — I471 Supraventricular tachycardia: Secondary | ICD-10-CM | POA: Diagnosis not present

## 2015-12-01 DIAGNOSIS — R002 Palpitations: Secondary | ICD-10-CM

## 2015-12-01 DIAGNOSIS — R55 Syncope and collapse: Secondary | ICD-10-CM | POA: Diagnosis not present

## 2015-12-01 NOTE — Patient Instructions (Signed)

## 2015-12-01 NOTE — Progress Notes (Signed)
Patient ID: Lindsay Lawrence, female   DOB: [birth year], 60 y.o.   MRN: [id number]      SUBJECTIVE: The patient presents for follow up of PSVT. Her palpitations have been under better control with metoprolol. She still has episodes of dizziness and near syncope but admits to not even drinking a bottle of water daily. She has been wearing compression stockings.   Review of Systems: As per "subjective", otherwise negative.  Allergies  Allergen Reactions  . Aspirin Other (See Comments)    Ulcer, Reflux  . Codeine Nausea And Vomiting    Speeds heart up, dizziness  . Fentanyl Other (See Comments)    Unknown   . Nsaids Other (See Comments)    reflux, ulcer  . Propoxyphene N-Acetaminophen Nausea And Vomiting    Speeds heart up, dizziness    Current Outpatient Prescriptions  Medication Sig Dispense Refill  . albuterol (PROVENTIL HFA;VENTOLIN HFA) 108 (90 BASE) MCG/ACT inhaler Inhale 2 puffs into the lungs as needed for wheezing or shortness of breath.     . ALPRAZolam (XANAX) 0.25 MG tablet TAKE ONE TABLET BY MOUTH THREE TIMES DAILY AS NEEDED FOR ANXIETY 90 tablet 2  . Azelastine HCl 0.15 % SOLN Place 1 spray into the nose daily as needed (sinus congestion).     . Cholecalciferol (VITAMIN D3) 5000 UNITS CAPS Take 1 capsule by mouth daily.    . cholestyramine (QUESTRAN) 4 G packet Take 1 packet (4 g total) by mouth 2 (two) times daily. Take 2 hours before or after taking other medicines. 60 each 2  . dicyclomine (BENTYL) 10 MG capsule Take 1 capsule (10 mg total) by mouth 2 (two) times daily as needed for spasms. 60 capsule 2  . diphenoxylate-atropine (LOMOTIL) 2.5-0.025 MG tablet Take 1 tablet by mouth 4 (four) times daily as needed for diarrhea or loose stools. 120 tablet 2  . ferrous gluconate (FERGON) 324 MG tablet Take 324 mg by mouth 2 (two) times daily with a meal.    . metoprolol tartrate (LOPRESSOR) 25 MG tablet TAKE ONE-HALF TABLET BY MOUTH TWICE DAILY 30 tablet 6  . mometasone  (ASMANEX 120 METERED DOSES) 220 MCG/INH inhaler Inhale 2 puffs into the lungs daily as needed (shortness of breath).     . Multiple Vitamin (MULTIVITAMIN WITH MINERALS) TABS tablet Take 1 tablet by mouth daily.    . nitroGLYCERIN (NITROSTAT) 0.4 MG SL tablet Place 1 tablet (0.4 mg total) under the tongue every 5 (five) minutes as needed. Up to 3 doses. If no relief after 3 rd dose, proceed to ED for evaluation 25 tablet 3  . ondansetron (ZOFRAN) 4 MG tablet Take 1 tablet (4 mg total) by mouth every 12 (twelve) hours as needed for nausea. 30 tablet 0  . Pantoprazole Sodium (PROTONIX PO) Take 40 mg by mouth daily.    . promethazine (PHENADOZ) 25 MG suppository Place 1 suppository (25 mg total) rectally 2 (two) times daily as needed for nausea or vomiting. 12 each 1  . ranitidine (ZANTAC) 150 MG tablet Take 1 tablet (150 mg total) by mouth 2 (two) times daily. 60 tablet 5  . traMADol (ULTRAM) 50 MG tablet Take 1 tablet (50 mg total) by mouth 2 (two) times daily as needed. (Patient taking differently: Take 50 mg by mouth 2 (two) times daily as needed for moderate pain. ) 30 tablet 2   No current facility-administered medications for this visit.    Past Medical History  Diagnosis Date  . Irritable bowel syndrome   .  Gastric ulcer   . GERD (gastroesophageal reflux disease)   . Palpitations   . Ejection fraction     EF 65%, echo, April 19, 2012  . Mitral regurgitation     Mild, echo, August, 2013  . Chest pain     Nuclear, normal, 2010,  //   hospital August, 2013 no evidence of injury  . Syncope     Vasovagal  . Reactive airway disease     Pulmonary function studies from June, 2013, show the patient does have significant response to bronchodilators.    Past Surgical History  Procedure Laterality Date  . Givens capsule study  05/03/2011    Procedure: GIVENS CAPSULE STUDY;  Surgeon: Malissa Hippo, MD;  [facility];  Service: Endoscopy;  Laterality: N/A;  7:30 am  . Colonoscopy     . Upper gastrointestinal endoscopy    . Cholecystectomy    . Abdominal hysterectomy    . Esophagogastroduodenoscopy  02/04/2012    Procedure: ESOPHAGOGASTRODUODENOSCOPY (EGD);  Surgeon: Malissa Hippo, MD;  [facility];  Service: Endoscopy;  Laterality: N/A;  320  . Esophagogastroduodenoscopy  06/02/2012    Procedure: ESOPHAGOGASTRODUODENOSCOPY (EGD);  Surgeon: Malissa Hippo, MD;  [facility];  Service: Endoscopy;  Laterality: N/A;  1050  . Flexible sigmoidoscopy N/A 05/31/2013    Procedure: FLEXIBLE SIGMOIDOSCOPY;  Surgeon: Malissa Hippo, MD;  [facility];  Service: Endoscopy;  Laterality: N/A;  250  . Esophagogastroduodenoscopy (egd) with esophageal dilation N/A 05/31/2013    Procedure: ESOPHAGOGASTRODUODENOSCOPY (EGD) WITH ESOPHAGEAL DILATION;  Surgeon: Malissa Hippo, MD;  [facility];  Service: Endoscopy;  Laterality: N/A;  . Stomach surgery    . Esophagogastroduodenoscopy N/A 06/07/2014    Procedure: ESOPHAGOGASTRODUODENOSCOPY (EGD);  Surgeon: Malissa Hippo, MD;  [facility];  Service: Endoscopy;  Laterality: N/A;  1035-rescheduled 9/25 @ 8:30 Ann to notify pt  . Flexible sigmoidoscopy N/A 06/07/2014    Procedure: FLEXIBLE SIGMOIDOSCOPY;  Surgeon: Malissa Hippo, MD;  [facility];  Service: Endoscopy;  Laterality: N/A;  . Esophageal dilation  06/07/2014    Procedure: ESOPHAGEAL DILATION;  Surgeon: Malissa Hippo, MD;  [facility];  Service: Endoscopy;;  . Esophagogastroduodenoscopy N/A 02/17/2015    Procedure: ESOPHAGOGASTRODUODENOSCOPY (EGD);  Surgeon: Malissa Hippo, MD;  [facility];  Service: Endoscopy;  Laterality: N/A;  730  . Esophagogastroduodenoscopy N/A 08/27/2015    Procedure: ESOPHAGOGASTRODUODENOSCOPY (EGD);  Surgeon: Malissa Hippo, MD;  [facility];  Service: Endoscopy;  Laterality: N/A;  1155  . Esophageal dilation N/A 08/27/2015    Procedure: ESOPHAGEAL  DILATION;  Surgeon: Malissa Hippo, MD;  [facility];  Service: Endoscopy;  Laterality: N/A;    Social History   Social History  . Marital Status: Married    Spouse Name: N/A  . Number of Children: N/A  . Years of Education: N/A   Occupational History  . Not on file.   Social History Main Topics  . Smoking status: Never Smoker   . Smokeless tobacco: Never Used  . Alcohol Use: No  . Drug Use: No  . Sexual Activity: Not on file   Other Topics Concern  . Not on file   Social History Narrative     Filed Vitals:   12/01/15 0819  BP: 108/76  Pulse: 85  Height:  (1.626 m)  Weight: 104 lb 6.4 oz (47.356 kg)  SpO2:  98%    PHYSICAL EXAM General: NAD HEENT: Normal. Neck: No JVD, no thyromegaly. Lungs: Clear to auscultation bilaterally with normal respiratory effort. CV: Nondisplaced PMI.  Regular rate and rhythm, normal S1/S2, no S3/S4, no murmur. No pretibial or periankle edema.  No carotid bruit.   Abdomen: Soft, nontender, no distention.  Neurologic: Alert and oriented.  Psych: Normal affect. Skin: Normal. Musculoskeletal: No gross deformities.  ECG: Most recent ECG reviewed.      ASSESSMENT AND PLAN: 1. Near syncope: Encouraged to drink more water daily. Continued use of thigh-high compression stockings 20-30 mmHg to help attenuate further episodes.  May consider fludrocortisone in future if episodes become more severe and/or frequent.  2. Palpitations/PSVT: Pt taking metoprolol 12.5 mg bid. No changes.   Dispo: f/u 1 year.  Prentice Docker, M.D., F.A.C.C.

## 2015-12-02 ENCOUNTER — Other Ambulatory Visit: Payer: Self-pay | Admitting: Cardiovascular Disease

## 2016-01-06 ENCOUNTER — Encounter (INDEPENDENT_AMBULATORY_CARE_PROVIDER_SITE_OTHER): Payer: Self-pay | Admitting: Internal Medicine

## 2016-01-06 ENCOUNTER — Ambulatory Visit (INDEPENDENT_AMBULATORY_CARE_PROVIDER_SITE_OTHER): Payer: BLUE CROSS/BLUE SHIELD | Admitting: Internal Medicine

## 2016-01-06 VITALS — BP 110/68 | HR 66 | Temp 98.2°F | Resp 18 | Ht 64.0 in | Wt 108.6 lb

## 2016-01-06 DIAGNOSIS — K219 Gastro-esophageal reflux disease without esophagitis: Secondary | ICD-10-CM

## 2016-01-06 DIAGNOSIS — K580 Irritable bowel syndrome with diarrhea: Secondary | ICD-10-CM

## 2016-01-06 DIAGNOSIS — F411 Generalized anxiety disorder: Secondary | ICD-10-CM | POA: Diagnosis not present

## 2016-01-06 DIAGNOSIS — R1013 Epigastric pain: Secondary | ICD-10-CM | POA: Diagnosis not present

## 2016-01-06 DIAGNOSIS — E559 Vitamin D deficiency, unspecified: Secondary | ICD-10-CM | POA: Diagnosis not present

## 2016-01-06 DIAGNOSIS — Z862 Personal history of diseases of the blood and blood-forming organs and certain disorders involving the immune mechanism: Secondary | ICD-10-CM

## 2016-01-06 DIAGNOSIS — G8929 Other chronic pain: Secondary | ICD-10-CM

## 2016-01-06 LAB — FERRITIN: FERRITIN: 88 ng/mL (ref 20–288)

## 2016-01-06 LAB — CBC
HEMATOCRIT: 41 % (ref 35.0–45.0)
HEMOGLOBIN: 13.4 g/dL (ref 11.7–15.5)
MCH: 29.5 pg (ref 27.0–33.0)
MCHC: 32.7 g/dL (ref 32.0–36.0)
MCV: 90.1 fL (ref 80.0–100.0)
MPV: 9.8 fL (ref 7.5–12.5)
Platelets: 202 10*3/uL (ref 140–400)
RBC: 4.55 MIL/uL (ref 3.80–5.10)
RDW: 13 % (ref 11.0–15.0)
WBC: 5.7 10*3/uL (ref 3.8–10.8)

## 2016-01-06 LAB — IRON AND TIBC
%SAT: 26 % (ref 11–50)
Iron: 93 ug/dL (ref 45–160)
TIBC: 352 ug/dL (ref 250–450)
UIBC: 259 ug/dL (ref 125–400)

## 2016-01-06 MED ORDER — PANTOPRAZOLE SODIUM 40 MG PO TBEC: 40.0000 mg | DELAYED_RELEASE_TABLET | Freq: Every day | ORAL | Status: DC

## 2016-01-06 MED ORDER — ALPRAZOLAM 0.25 MG PO TABS: 0.2500 mg | ORAL_TABLET | Freq: Three times a day (TID) | ORAL | Status: DC | PRN

## 2016-01-06 MED ORDER — DICYCLOMINE HCL 10 MG PO CAPS: 10.0000 mg | ORAL_CAPSULE | Freq: Two times a day (BID) | ORAL | Status: DC | PRN

## 2016-01-06 NOTE — Patient Instructions (Signed)
Physician will call with results of blood tests when completed. 

## 2016-01-06 NOTE — Progress Notes (Signed)
Presenting complaint;  Follow-up multiple problems.  Database:  Shadowing is 60 year old Caucasian female who is here for scheduled visit. She was last seen in November 2016. Following that visit she underwent EGD with ED. EGD revealed no evidence of esophageal ring stricture or web. She had patent Billroth I anastomosis with diffuse gastritis and biopsy was negative for H. pylori. She had small amount of food debris in gastric pouch. Esophagus was dilated by passing 56 [place] Maloney dilator resulting in mucosal disruption at GE junction suggestive of soft stricture. She has history of iron deficiency anemia felt to be due to impaired absorption. Last year she received ferritin twice with correction of her anemia and iron stores. She also has history of vitamin D deficiency with level in August 2016 was normal.:  Subjective:  Patient has multiple complaints. She states her swallowing difficulty is not as good as it was post dilation. She has not had any episode of food impaction. She has daily heartburn. She is watching her diet. She also complains of frequent have not daily epigastric pain associated with nausea but she has not had any episode of vomiting should she had a wires 5 weeks ago. She continues to complain of diarrhea and she has anywhere from 3-5 stools per day. She had one accident recently. She has occasional nocturnal bowel movement. She has not had a formed stool in several months. She has not lost any weight since her last visit. She does not take Lomotil very often. She has intermittent hematochezia usually in the form of blood on the tissue. She has not experienced frank rectal bleeding or melena. She is working full-time despite her symptoms. She does not take OTC NSAIDs. She is requesting new prescription for per tonics dicyclomine and alprazolam.    Current Medications: Outpatient Encounter Prescriptions as of 01/06/2016  Medication Sig  . albuterol (PROVENTIL HFA;VENTOLIN  HFA) 108 (90 BASE) MCG/ACT inhaler Inhale 2 puffs into the lungs as needed for wheezing or shortness of breath.   . ALPRAZolam (XANAX) 0.25 MG tablet TAKE ONE TABLET BY MOUTH THREE TIMES DAILY AS NEEDED FOR ANXIETY  . Azelastine HCl 0.15 % SOLN Place 1 spray into the nose daily as needed (sinus congestion).   . Cholecalciferol (VITAMIN D3) 5000 UNITS CAPS Take 1 capsule by mouth daily.  . cholestyramine (QUESTRAN) 4 G packet Take 1 packet (4 g total) by mouth 2 (two) times daily. Take 2 hours before or after taking other medicines.  . dicyclomine (BENTYL) 10 MG capsule Take 1 capsule (10 mg total) by mouth 2 (two) times daily as needed for spasms.  . diphenoxylate-atropine (LOMOTIL) 2.5-0.025 MG tablet Take 1 tablet by mouth 4 (four) times daily as needed for diarrhea or loose stools.  . ferrous gluconate (FERGON) 324 MG tablet Take 324 mg by mouth 2 (two) times daily with a meal.  . metoprolol tartrate (LOPRESSOR) 25 MG tablet Take 0.5 tablets (12.5 mg total) by mouth 2 (two) times daily.  . mometasone (ASMANEX 120 METERED DOSES) 220 MCG/INH inhaler Inhale 2 puffs into the lungs daily as needed (shortness of breath).   . Multiple Vitamin (MULTIVITAMIN WITH MINERALS) TABS tablet Take 1 tablet by mouth daily.  . nitroGLYCERIN (NITROSTAT) 0.4 MG SL tablet Place 1 tablet (0.4 mg total) under the tongue every 5 (five) minutes as needed. Up to 3 doses. If no relief after 3 rd dose, proceed to ED for evaluation  . ondansetron (ZOFRAN) 4 MG tablet Take 1 tablet (4 mg total) by  mouth every 12 (twelve) hours as needed for nausea.  . Pantoprazole Sodium (PROTONIX PO) Take 40 mg by mouth daily.  . promethazine (PHENADOZ) 25 MG suppository Place 1 suppository (25 mg total) rectally 2 (two) times daily as needed for nausea or vomiting.  . traMADol (ULTRAM) 50 MG tablet Take 1 tablet (50 mg total) by mouth 2 (two) times daily as needed. (Patient taking differently: Take 50 mg by mouth 2 (two) times daily as needed  for moderate pain. )  . ranitidine (ZANTAC) 150 MG tablet Take 1 tablet (150 mg total) by mouth 2 (two) times daily. (Patient not taking: Reported on 01/06/2016)  . [DISCONTINUED] metoprolol tartrate (LOPRESSOR) 25 MG tablet TAKE ONE-HALF TABLET BY MOUTH TWICE DAILY (Patient not taking: Reported on 01/06/2016)   No facility-administered encounter medications on file as of 01/06/2016.     Objective: Blood pressure 110/68, pulse 66, temperature 98.2 F (36.8 C), temperature source Oral, resp. rate 18, height 5\' 4"  (1.626 m), weight 108 lb 9.6 oz (49.261 kg). Patient is alert and in no acute distress. Conjunctiva is pink. Sclera is nonicteric Oropharyngeal mucosa is normal. No neck masses or thyromegaly noted. Cardiac exam with regular rhythm normal S1 and S2. No murmur or gallop noted. Lungs are clear to auscultation. Abdomen is symmetrical. Bowel sounds are normal. On palpation she has mild generalized tenderness but is more so in epigastric region and LLQ. No organomegaly or masses.  No LE edema or clubbing noted.  Labs/studies Results: Lab data from 07/29/2015  Serum iron 110, TIBC 331 and saturation 33%  Serum ferritin 175. H&H was 14.2 and 42.1.  Assessment:  #1. GERD. Symptoms are poorly controlled. 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 She is on dietary measures. #2. Chronic epigastric pain. EGD in December was negative for recurrent ulcer disease for which she had surgery in November 2015. She had nonhealing large peptic ulcer. #3. Diarrhea. She suspected to have IBS but has not responded to therapy. She may need diagnostic colonoscopy. Patient advised to take Lomotil unscheduled in addition to dicyclomine and cholestyramine. #4. History of iron deficiency anemia. H&H and iron stores were normal over 5 months ago. Will reevaluate. #5. History of vitamin D deficiency. Level eight months ago was low normal.  Plan:  Patient will go to lab for CBC,  serum iron TIBC ferritin and vitamin D2 level. New prescription given for pantoprazole 40 mg by mouth every morning 30 with 11 refills. Prescription also given for dicyclomine 10 mg by mouth twice a day 60 with 5 refills. Prescription also given for alprazolam 0.25 mg 3 times a day when necessary 90 with 5 refills. Prescription to be filled every 30 days and no earlier. Office visit in 6 months.

## 2016-01-07 LAB — VITAMIN D 25 HYDROXY (VIT D DEFICIENCY, FRACTURES): Vit D, 25-Hydroxy: 16 ng/mL — ABNORMAL LOW (ref 30–100)

## 2016-01-08 ENCOUNTER — Telehealth (INDEPENDENT_AMBULATORY_CARE_PROVIDER_SITE_OTHER): Payer: Self-pay | Admitting: *Deleted

## 2016-01-08 NOTE — Telephone Encounter (Signed)
Lindsay Lawrence called and ask if after Dr.Rehman reviews results , will we mail to her both of their labs.

## 2016-01-09 NOTE — Telephone Encounter (Signed)
Labs mailed to patient.

## 2016-02-18 ENCOUNTER — Encounter (INDEPENDENT_AMBULATORY_CARE_PROVIDER_SITE_OTHER): Payer: Self-pay

## 2016-06-25 ENCOUNTER — Other Ambulatory Visit: Payer: Self-pay | Admitting: Cardiovascular Disease

## 2016-07-06 ENCOUNTER — Encounter (INDEPENDENT_AMBULATORY_CARE_PROVIDER_SITE_OTHER): Payer: Self-pay | Admitting: Internal Medicine

## 2016-07-06 ENCOUNTER — Ambulatory Visit (INDEPENDENT_AMBULATORY_CARE_PROVIDER_SITE_OTHER): Payer: BLUE CROSS/BLUE SHIELD | Admitting: Internal Medicine

## 2016-07-06 VITALS — BP 114/70 | HR 74 | Temp 97.8°F | Resp 18 | Ht 64.0 in | Wt 111.2 lb

## 2016-07-06 DIAGNOSIS — R1013 Epigastric pain: Secondary | ICD-10-CM

## 2016-07-06 DIAGNOSIS — Z862 Personal history of diseases of the blood and blood-forming organs and certain disorders involving the immune mechanism: Secondary | ICD-10-CM

## 2016-07-06 DIAGNOSIS — K580 Irritable bowel syndrome with diarrhea: Secondary | ICD-10-CM

## 2016-07-06 DIAGNOSIS — K219 Gastro-esophageal reflux disease without esophagitis: Secondary | ICD-10-CM | POA: Diagnosis not present

## 2016-07-06 DIAGNOSIS — F411 Generalized anxiety disorder: Secondary | ICD-10-CM | POA: Diagnosis not present

## 2016-07-06 DIAGNOSIS — G8929 Other chronic pain: Secondary | ICD-10-CM

## 2016-07-06 MED ORDER — DIPHENOXYLATE-ATROPINE 2.5-0.025 MG PO TABS: 1.0000 | ORAL_TABLET | Freq: Four times a day (QID) | ORAL | 2 refills | Status: DC | PRN

## 2016-07-06 MED ORDER — DICYCLOMINE HCL 10 MG PO CAPS: 10.0000 mg | ORAL_CAPSULE | Freq: Two times a day (BID) | ORAL | 5 refills | Status: DC | PRN

## 2016-07-06 MED ORDER — PANTOPRAZOLE SODIUM 40 MG PO TBEC: 40.0000 mg | DELAYED_RELEASE_TABLET | Freq: Every day | ORAL | 5 refills | Status: DC

## 2016-07-06 MED ORDER — RANITIDINE HCL 150 MG PO TABS: 150.0000 mg | ORAL_TABLET | Freq: Every day | ORAL | 5 refills | Status: DC

## 2016-07-06 MED ORDER — RANITIDINE HCL 150 MG PO TABS: 150.0000 mg | ORAL_TABLET | Freq: Every day | ORAL | 1 refills | Status: DC

## 2016-07-06 MED ORDER — ALPRAZOLAM 0.25 MG PO TABS: 0.2500 mg | ORAL_TABLET | Freq: Three times a day (TID) | ORAL | 2 refills | Status: DC | PRN

## 2016-07-06 NOTE — Patient Instructions (Signed)
Call office with progress report in one week.  If Epigastric pain does not improve will consider esophagogastroduodenoscopy.

## 2016-07-06 NOTE — Progress Notes (Signed)
Presenting complaint;  Patient has multiple complaints.  Subjective:  Patient is 60 year old Caucasian female who is here for scheduled visit. She was last seen 6 months ago when she weighed 108 pounds. She says she does not feel well. She says epigastric pain has gotten worse over the last several weeks. She has daily heartburn. She also has nocturnal regurgitation at least 2-3 times a week. She has not experienced any vomiting. She remains with diarrhea. On most days she has one to 3 stools per day. She may notice blood on tissue occasionally. She continues to complain of left-sided abdominal pain. She denies fever chills or night sweats. She states she has not used albuterol in a long time. She has not taken Zofran recently. She is not sure why she has gained 4 pounds. He also complains of dysphagia to solids but she says it has not been as bad as previously. She has not had any episode of food impaction. She request medication refill on alprazolam and dicyclomine. She does not take OTC NSAIDs.    Current Medications: Outpatient Encounter Prescriptions as of 07/06/2016  Medication Sig  . albuterol (PROVENTIL HFA;VENTOLIN HFA) 108 (90 BASE) MCG/ACT inhaler Inhale 2 puffs into the lungs as needed for wheezing or shortness of breath.   . ALPRAZolam (XANAX) 0.25 MG tablet Take 1 tablet (0.25 mg total) by mouth 3 (three) times daily as needed. Fill every 30 days  . Azelastine HCl 0.15 % SOLN Place 1 spray into the nose daily as needed (sinus congestion).   . Cholecalciferol (VITAMIN D3) 5000 UNITS CAPS Take 1 capsule by mouth daily.  . cholestyramine (QUESTRAN) 4 G packet Take 1 packet (4 g total) by mouth 2 (two) times daily. Take 2 hours before or after taking other medicines.  . dicyclomine (BENTYL) 10 MG capsule Take 1 capsule (10 mg total) by mouth 2 (two) times daily as needed for spasms.  . diphenoxylate-atropine (LOMOTIL) 2.5-0.025 MG tablet Take 1 tablet by mouth 4 (four) times daily as  needed for diarrhea or loose stools.  . ferrous gluconate (FERGON) 324 MG tablet Take 324 mg by mouth 2 (two) times daily with a meal.  . FLUZONE QUADRIVALENT 0.5 ML injection Inject 0.5 mLs into the muscle.   . metoprolol tartrate (LOPRESSOR) 25 MG tablet TAKE ONE-HALF TABLET BY MOUTH TWICE DAILY  . mometasone (ASMANEX 120 METERED DOSES) 220 MCG/INH inhaler Inhale 2 puffs into the lungs daily as needed (shortness of breath).   . Multiple Vitamin (MULTIVITAMIN WITH MINERALS) TABS tablet Take 1 tablet by mouth daily.  . nitroGLYCERIN (NITROSTAT) 0.4 MG SL tablet Place 1 tablet (0.4 mg total) under the tongue every 5 (five) minutes as needed. Up to 3 doses. If no relief after 3 rd dose, proceed to ED for evaluation  . ondansetron (ZOFRAN) 4 MG tablet Take 1 tablet (4 mg total) by mouth every 12 (twelve) hours as needed for nausea.  . pantoprazole (PROTONIX) 40 MG tablet Take 1 tablet (40 mg total) by mouth daily.  . promethazine (PHENADOZ) 25 MG suppository Place 1 suppository (25 mg total) rectally 2 (two) times daily as needed for nausea or vomiting.  . ranitidine (ZANTAC) 150 MG tablet Take 1 tablet (150 mg total) by mouth 2 (two) times daily.  . traMADol (ULTRAM) 50 MG tablet Take 1 tablet (50 mg total) by mouth 2 (two) times daily as needed. (Patient taking differently: Take 50 mg by mouth 2 (two) times daily as needed for moderate pain. )  No facility-administered encounter medications on file as of 07/06/2016.      Objective: Blood pressure 114/70, pulse 74, temperature 97.8 F (36.6 C), temperature source Oral, resp. rate 18, height 5\' 4"  (1.626 m), weight 111 lb 3.2 oz (50.4 kg). Patient is alert and in no acute distress. Conjunctiva is pink. Sclera is nonicteric Oropharyngeal mucosa is normal. No neck masses or thyromegaly noted. Cardiac exam with regular rhythm normal S1 and S2. No murmur or gallop noted. Lungs are clear to auscultation. Abdomen is symmetrical. Bowel sounds are  normal. On palpation abdomen is soft with moderate midepigastric tenderness and mild tenderness in left lower quadrant for abdomen. No organomegaly or masses.  No LE edema or clubbing noted.  Labs/studies Results: Lab data from 01/06/2016  H&H 13.4 and 41  25-hydroxy vitamin D level was 16   Serum iron 93, TIBC 352 and saturation 26%. Serum ferritin was 88.  Assessment:  #1. Epigastric pain. She has acute on chronic epigastric pain. He has several year history of nonhealing gastric ulcer. She underwent truncal vagotomy and antrectomy in November 2014. She had EGD in December last year revealing patent Billroth I anastomosis and diffuse gastritis felt to be due to stasis/gastroparesis. Gastric biopsy was negative for H. pylori. I'm concerned that her also could be back. She needs to be back on PPI. If she does not improve with change in her therapy she will undergo diagnostic EGD. #2. Anxiety state. Anxiety state felt to be due to chronic illness. She appears to be doing well with alprazolam. Will refill her medication. #3. History of iron deficiency anemia. She had IDA for several years. H&H and iron studies were normal 6 months ago. Will repeat the studies in 3 months. #4. GERD. Symptoms are not well controlled with H2B. She also complains of intermittent dysphagia. Esophagus would be dilated at the time of next EGD whenever that may be. #5. Chronic diarrhea. Diarrhea appears to be secondary to gastric surgery and IBS. She needs to be taking medication on schedule rather than when necessary. Patient has been advised to use dicyclomine on when necessary basis since she is suspected to have post vagotomy gastroparesis is.   Plan:  Patient advised to use Questran or cholestyramine and diphenoxylate on schedule rather than when necessary. Begin pantoprazole 40 mg by mouth every morning. Patient will call with progress report in one week. Unless epigastric pain resolved he'll consider diagnostic  EGD. Patient will have CBC, comprehensive chemistry panel, serum iron, TIBC, ferritin and vitamin B12 level prior to next visit in 3 months.

## 2016-07-07 ENCOUNTER — Other Ambulatory Visit (INDEPENDENT_AMBULATORY_CARE_PROVIDER_SITE_OTHER): Payer: Self-pay | Admitting: *Deleted

## 2016-07-07 DIAGNOSIS — R7989 Other specified abnormal findings of blood chemistry: Secondary | ICD-10-CM

## 2016-07-07 DIAGNOSIS — R1013 Epigastric pain: Principal | ICD-10-CM

## 2016-07-07 DIAGNOSIS — G8929 Other chronic pain: Secondary | ICD-10-CM

## 2016-07-07 DIAGNOSIS — D508 Other iron deficiency anemias: Secondary | ICD-10-CM

## 2016-07-07 DIAGNOSIS — K219 Gastro-esophageal reflux disease without esophagitis: Secondary | ICD-10-CM

## 2016-07-12 ENCOUNTER — Telehealth (INDEPENDENT_AMBULATORY_CARE_PROVIDER_SITE_OTHER): Payer: Self-pay | Admitting: *Deleted

## 2016-07-12 NOTE — Telephone Encounter (Signed)
Patient left a message with her 1 week progress report. She states that she is not any better. Stomach continues to bother her and is burning.  Per noted from OV `07/06/2016.  Patient advised to use Questran or cholestyramine and diphenoxylate on schedule rather than when necessary. Begin pantoprazole 40 mg by mouth every morning. Patient will call with progress report in one week. Patient advised to use Questran or cholestyramine and diphenoxylate on schedule rather than when necessary. Begin pantoprazole 40 mg by mouth every morning. Patient will call with progress report in one week. Unless epigastric pain resolved he'll consider diagnostic EGD. Patient will have CBC, comprehensive chemistry panel, serum iron, TIBC, ferritin and vitamin B12 level prior to next visit in 3 months. Patient will have CBC, comprehensive chemistry panel, serum iron, TIBC, ferritin and vitamin B12 level prior to next visit in 3 months.  This information has been forwarded to [place].

## 2016-07-13 ENCOUNTER — Encounter (INDEPENDENT_AMBULATORY_CARE_PROVIDER_SITE_OTHER): Payer: Self-pay | Admitting: *Deleted

## 2016-07-13 ENCOUNTER — Other Ambulatory Visit (INDEPENDENT_AMBULATORY_CARE_PROVIDER_SITE_OTHER): Payer: Self-pay | Admitting: *Deleted

## 2016-07-13 DIAGNOSIS — R1013 Epigastric pain: Principal | ICD-10-CM

## 2016-07-13 DIAGNOSIS — R1010 Upper abdominal pain, unspecified: Secondary | ICD-10-CM | POA: Insufficient documentation

## 2016-07-13 DIAGNOSIS — R12 Heartburn: Secondary | ICD-10-CM

## 2016-07-13 DIAGNOSIS — G8929 Other chronic pain: Secondary | ICD-10-CM

## 2016-07-13 NOTE — Telephone Encounter (Signed)
[facility]

## 2016-07-13 NOTE — Telephone Encounter (Signed)
EGD sch'd 08/04/16, patient aware

## 2016-08-04 ENCOUNTER — Ambulatory Visit (HOSPITAL_COMMUNITY)
Admission: RE | Admit: 2016-08-04 | Discharge: 2016-08-04 | Disposition: A | Discharge status: Home / Self Care | Payer: BLUE CROSS/BLUE SHIELD | Source: Ambulatory Visit | Attending: Internal Medicine | Admitting: Internal Medicine

## 2016-08-04 ENCOUNTER — Encounter (HOSPITAL_COMMUNITY)
Admission: RE | Disposition: A | Discharge status: Home / Self Care | Payer: Self-pay | Source: Ambulatory Visit | Attending: Internal Medicine

## 2016-08-04 ENCOUNTER — Encounter (HOSPITAL_COMMUNITY): Payer: Self-pay | Admitting: *Deleted

## 2016-08-04 DIAGNOSIS — K319 Disease of stomach and duodenum, unspecified: Secondary | ICD-10-CM | POA: Diagnosis not present

## 2016-08-04 DIAGNOSIS — R12 Heartburn: Secondary | ICD-10-CM | POA: Insufficient documentation

## 2016-08-04 DIAGNOSIS — K219 Gastro-esophageal reflux disease without esophagitis: Secondary | ICD-10-CM | POA: Diagnosis not present

## 2016-08-04 DIAGNOSIS — R1314 Dysphagia, pharyngoesophageal phase: Secondary | ICD-10-CM | POA: Diagnosis not present

## 2016-08-04 DIAGNOSIS — Z980 Intestinal bypass and anastomosis status: Secondary | ICD-10-CM | POA: Diagnosis not present

## 2016-08-04 DIAGNOSIS — R1013 Epigastric pain: Secondary | ICD-10-CM | POA: Diagnosis present

## 2016-08-04 DIAGNOSIS — G8929 Other chronic pain: Secondary | ICD-10-CM | POA: Insufficient documentation

## 2016-08-04 DIAGNOSIS — K2960 Other gastritis without bleeding: Secondary | ICD-10-CM | POA: Diagnosis not present

## 2016-08-04 DIAGNOSIS — I340 Nonrheumatic mitral (valve) insufficiency: Secondary | ICD-10-CM | POA: Diagnosis not present

## 2016-08-04 DIAGNOSIS — R1010 Upper abdominal pain, unspecified: Secondary | ICD-10-CM | POA: Insufficient documentation

## 2016-08-04 HISTORY — PX: ESOPHAGOGASTRODUODENOSCOPY: SHX5428

## 2016-08-04 HISTORY — PX: BIOPSY: SHX5522

## 2016-08-04 HISTORY — PX: ESOPHAGEAL DILATION: SHX303

## 2016-08-04 SURGERY — EGD (ESOPHAGOGASTRODUODENOSCOPY)
Anesthesia: Moderate Sedation

## 2016-08-04 MED ORDER — MEPERIDINE HCL 50 MG/ML IJ SOLN
INTRAMUSCULAR | Status: AC
  Filled 2016-08-04: qty 1

## 2016-08-04 MED ORDER — SUCRALFATE 1 G PO TABS: 2.0000 g | ORAL_TABLET | Freq: Every day | ORAL | 3 refills | Status: DC

## 2016-08-04 MED ORDER — MIDAZOLAM HCL 5 MG/5ML IJ SOLN
INTRAMUSCULAR | Status: AC
  Filled 2016-08-04: qty 10

## 2016-08-04 MED ORDER — STERILE WATER FOR IRRIGATION IR SOLN
Status: DC | PRN
  Administered 2016-08-04: 13:00:00

## 2016-08-04 MED ORDER — MEPERIDINE HCL 50 MG/ML IJ SOLN
INTRAMUSCULAR | Status: DC | PRN
  Administered 2016-08-04 (×2): 25 mg via INTRAVENOUS

## 2016-08-04 MED ORDER — SODIUM CHLORIDE 0.9 % IV SOLN
INTRAVENOUS | Status: DC
  Administered 2016-08-04: 12:00:00 via INTRAVENOUS

## 2016-08-04 MED ORDER — MIDAZOLAM HCL 5 MG/5ML IJ SOLN
INTRAMUSCULAR | Status: DC | PRN
  Administered 2016-08-04: 1 mg via INTRAVENOUS
  Administered 2016-08-04 (×3): 2 mg via INTRAVENOUS

## 2016-08-04 MED ORDER — BUTAMBEN-TETRACAINE-BENZOCAINE 2-2-14 % EX AERO
INHALATION_SPRAY | CUTANEOUS | Status: DC | PRN
  Administered 2016-08-04: 2 via TOPICAL

## 2016-08-04 NOTE — Op Note (Signed)
[facility] Patient Name: Lindsay Lawrence Procedure Date: 08/04/2016 12:50 PM MRN: [id number] Date of Birth: 1956-04-11 Attending MD: Lionel December , MD CSN: [id number] Age: 60 Admit Type: Outpatient Procedure:                Upper GI endoscopy Indications:              Epigastric abdominal pain, Esophageal dysphagia Providers:                Lionel December, MD, Loma Messing B. Patsy Lager, RN, Lizabeth Leyden, RN, Birder Robson, Technician Referring MD:             Ignatius Specking, MD Medicines:                Cetacaine spray, Meperidine 50 mg IV, Midazolam 7                            mg IV Complications:            No immediate complications. Estimated Blood Loss:     Estimated blood loss was minimal. Procedure:                Pre-Anesthesia Assessment:                           - Prior to the procedure, a History and Physical                            was performed, and patient medications and                            allergies were reviewed. The patient's tolerance of                            previous anesthesia was also reviewed. The risks                            and benefits of the procedure and the sedation                            options and risks were discussed with the patient.                            All questions were answered, and informed consent                            was obtained. Prior Anticoagulants: The patient has                            taken no previous anticoagulant or antiplatelet                            agents. ASA Grade Assessment: II - A patient with  mild systemic disease. After reviewing the risks                            and benefits, the patient was deemed in                            satisfactory condition to undergo the procedure.                           After obtaining informed consent, the endoscope was                            passed under direct vision. Throughout the                      procedure, the patient's blood pressure, pulse, and                            oxygen saturations were monitored continuously. The                            [id number] ([id number]) was introduced through the                            mouth, and advanced to the second part of duodenum.                            The upper GI endoscopy was accomplished without                            difficulty. The patient tolerated the procedure                            well. Scope In: 1:00:58 PM Scope Out: 1:17:08 PM Total Procedure Duration: 0 hours 16 minutes 10 seconds  Findings:      The examined esophagus was normal.      The Z-line was regular and was found 40 cm from the incisors.      No endoscopic abnormality was evident in the esophagus to explain the       patient's complaint of dysphagia. It was decided, however, to proceed       with dilation of the entire esophagus. The scope was withdrawn. Dilation       was performed with a Maloney dilator with mild resistance at 54 Fr. The       dilation site was examined following endoscope reinsertion and showed no       change.      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 A gastric       pouch with a large size was found containing bile. The gastroduodenal       anastomosis was characterized by congestion, edema, erythema and friable       mucosa. Biopsies were taken with a cold forceps for histology.      The duodenal bulb and second portion of the duodenum were normal. Impression:               - Normal esophagus.                           -  Z-line regular, 40 cm from the incisors.                           - No endoscopic esophageal abnormality to explain                            patient's dysphagia. Esophagus dilated. Dilated.                           - Patent Billroth I gastroduodenostomy was found,                            characterized by congestion, edema, erythema and                             friable mucosa. Bile present in the stomach but no                            food debris. Biopsied.                           - Normal duodenal bulb and second portion of the                            duodenum.                           comment: Gastritis is possibly secondary to bile.                            Biopsy taken to rule out other conditions. Moderate Sedation:      Moderate (conscious) sedation was administered by the endoscopy nurse       and supervised by the endoscopist. The following parameters were       monitored: oxygen saturation, heart rate, blood pressure, CO2       capnography and response to care. Total physician intraservice time was       23 minutes. Recommendation:           - Patient has a contact number available for                            emergencies. The signs and symptoms of potential                            delayed complications were discussed with the                            patient. Return to normal activities tomorrow.                            Written discharge instructions were provided to the                            patient.                           -  Patient has a contact number available for                            emergencies. The signs and symptoms of potential                            delayed complications were discussed with the                            patient. Return to normal activities tomorrow.                            Written discharge instructions were provided to the                            patient.                           - Resume previous diet today.                           - Continue present medications.                           - Use sucralfate 2 grams PO qhs.                           - Await pathology results. Procedure Code(s):        --- Professional ---                           [phone], Esophagogastroduodenoscopy, flexible,                            transoral; with biopsy, single or  multiple                           43450, Dilation of esophagus, by unguided sound or                            bougie, single or multiple passes                           99152, Moderate sedation services provided by the                            same physician or other qualified health care                            professional performing the diagnostic or                            therapeutic service that the sedation supports,                            requiring the presence of an independent trained  observer to assist in the monitoring of the                            patient's level of consciousness and physiological                            status; initial 15 minutes of intraservice time,                            patient age 18 years or older                           [phone], Moderate sedation services; each additional                            15 minutes intraservice time Diagnosis Code(s):        --- Professional ---                           Z98.0, Intestinal bypass and anastomosis status                           R10.13, Epigastric pain                           R13.14, Dysphagia, pharyngoesophageal phase CPT copyright 2016 [employer]. All rights reserved. The codes documented in this report are preliminary and upon coder review may  be revised to meet current compliance requirements. Lionel DecemberNajeeb Rehman, MD Lionel DecemberNajeeb Rehman, MD 08/04/2016 1:29:20 PM This report has been signed electronically. Number of Addenda: 0

## 2016-08-04 NOTE — Discharge Instructions (Signed)
Resume usual medications and diet. Sucralfate/Carafate 2 g by mouth daily at bedtime. No driving for 24 hours. Physician will call with biopsy results.    Esophagogastroduodenoscopy, Care After Introduction Refer to this sheet in the next few weeks. These instructions provide you with information about caring for yourself after your procedure. Your health care provider may also give you more specific instructions. Your treatment has been planned according to current medical practices, but problems sometimes occur. Call your health care provider if you have any problems or questions after your procedure. What can I expect after the procedure? After the procedure, it is common to have:  A sore throat.  Nausea.  Bloating.  Dizziness.  Fatigue. Follow these instructions at home:  Do not eat or drink anything until the numbing medicine (local anesthetic) has worn off and your gag reflex has returned. You will know that the local anesthetic has worn off when you can swallow comfortably.  Do not drive for 24 hours if you received a medicine to help you relax (sedative).  If your health care provider took a tissue sample for testing during the procedure, make sure to get your test results. This is your responsibility. Ask your health care provider or the department performing the test when your results will be ready.  Keep all follow-up visits as told by your health care provider. This is important. Contact a health care provider if:  You cannot stop coughing.  You are not urinating.  You are urinating less than usual. Get help right away if:  You have trouble swallowing.  You cannot eat or drink.  You have throat or chest pain that gets worse.  You are dizzy or light-headed.  You faint.  You have nausea or vomiting.  You have chills.  You have a fever.  You have severe abdominal pain.  You have black, tarry, or bloody stools. This information is not intended to  replace advice given to you by your health care provider. Make sure you discuss any questions you have with your health care provider. Document Released: 08/16/2012 Document Revised: 02/05/2016 Document Reviewed: 07/24/2015  2017 Elsevier    Gastritis, Adult Gastritis is soreness and puffiness (inflammation) of the lining of the stomach. If you do not get help, gastritis can cause bleeding and sores (ulcers) in the stomach. HOME CARE   Only take medicine as told by your doctor.  If you were given antibiotic medicines, take them as told. Finish the medicines even if you start to feel better.  Drink enough fluids to keep your pee (urine) clear or pale yellow.  Avoid foods and drinks that make your problems worse. Foods you may want to avoid include:  Caffeine or alcohol.  Chocolate.  Mint.  Garlic and onions.  Spicy foods.  Citrus fruits, including oranges, lemons, or limes.  Food containing tomatoes, including sauce, chili, salsa, and pizza.  Fried and fatty foods.  Eat small meals throughout the day instead of large meals. GET HELP RIGHT AWAY IF:   You have black or dark red poop (stools).  You throw up (vomit) blood. It may look like coffee grounds.  You cannot keep fluids down.  Your belly (abdominal) pain gets worse.  You have a fever.  You do not feel better after 1 week.  You have any other questions or concerns. MAKE SURE YOU:   Understand these instructions.  Will watch your condition.  Will get help right away if you are not doing well or get  worse. This information is not intended to replace advice given to you by your health care provider. Make sure you discuss any questions you have with your health care provider. Document Released: 02/16/2008 Document Revised: 11/22/2011 Document Reviewed: 05/24/2015 Elsevier Interactive Patient Education  2017 [employer]

## 2016-08-04 NOTE — H&P (Signed)
Lindsay Lawrence is an 60 y.o. female.   Chief Complaint: Patient is here for EGD and ED. HPI: Patient is 78-year-old Caucasian female with complicated GI history. She had surgery for peptic ulcer disease about 2 years ago. She has been maintained on H2 B since then. She was seen in the office but a month: Switch to PPI. She remains with epigastric pain nausea and frequent regurgitation. She is also complaining of dysphagia to solids. Her esophagus has been dilated the past. She denies melena or recent weight loss. Last EGD was in 08/26/2016 revealing patent anastomosis and food debris in the stomach consistent with gastroparesis.  Past Medical History:  Diagnosis Date  . Chest pain    Nuclear, normal, 2010,  //   hospital August, 2013 no evidence of injury  . Ejection fraction    EF 65%, echo, April 19, 2012  . Gastric ulcer   . GERD (gastroesophageal reflux disease)   . Irritable bowel syndrome   . Mitral regurgitation    Mild, echo, August, 2013  . Palpitations   . Reactive airway disease    Pulmonary function studies from June, 2013, show the patient does have significant response to bronchodilators.  . Syncope    Vasovagal    Past Surgical History:  Procedure Laterality Date  . ABDOMINAL HYSTERECTOMY    . CHOLECYSTECTOMY    . COLONOSCOPY    . ESOPHAGEAL DILATION  06/07/2014   Procedure: ESOPHAGEAL DILATION;  Surgeon: Malissa Hippo, MD;  Location: [facility];  Service: Endoscopy;;  . ESOPHAGEAL DILATION N/A 08/27/2015   Procedure: ESOPHAGEAL DILATION;  Surgeon: Malissa Hippo, MD;  Location: [facility];  Service: Endoscopy;  Laterality: N/A;  . ESOPHAGOGASTRODUODENOSCOPY  02/04/2012   Procedure: ESOPHAGOGASTRODUODENOSCOPY (EGD);  Surgeon: Malissa Hippo, MD;  Location: [facility];  Service: Endoscopy;  Laterality: N/A;  320  . ESOPHAGOGASTRODUODENOSCOPY  06/02/2012   Procedure: ESOPHAGOGASTRODUODENOSCOPY (EGD);  Surgeon: Malissa Hippo, MD;  Location: [facility];   Service: Endoscopy;  Laterality: N/A;  1050  . ESOPHAGOGASTRODUODENOSCOPY N/A 06/07/2014   Procedure: ESOPHAGOGASTRODUODENOSCOPY (EGD);  Surgeon: Malissa Hippo, MD;  Location: [facility];  Service: Endoscopy;  Laterality: N/A;  1035-rescheduled 9/25 @ 8:30 Ann to notify pt  . ESOPHAGOGASTRODUODENOSCOPY N/A 02/17/2015   Procedure: ESOPHAGOGASTRODUODENOSCOPY (EGD);  Surgeon: Malissa Hippo, MD;  Location: [facility];  Service: Endoscopy;  Laterality: N/A;  730  . ESOPHAGOGASTRODUODENOSCOPY N/A 08/27/2015   Procedure: ESOPHAGOGASTRODUODENOSCOPY (EGD);  Surgeon: Malissa Hippo, MD;  Location: [facility];  Service: Endoscopy;  Laterality: N/A;  1155  . ESOPHAGOGASTRODUODENOSCOPY (EGD) WITH ESOPHAGEAL DILATION N/A 05/31/2013   Procedure: ESOPHAGOGASTRODUODENOSCOPY (EGD) WITH ESOPHAGEAL DILATION;  Surgeon: Malissa Hippo, MD;  Location: [facility];  Service: Endoscopy;  Laterality: N/A;  . FLEXIBLE SIGMOIDOSCOPY N/A 05/31/2013   Procedure: FLEXIBLE SIGMOIDOSCOPY;  Surgeon: Malissa Hippo, MD;  Location: [facility];  Service: Endoscopy;  Laterality: N/A;  250  . FLEXIBLE SIGMOIDOSCOPY N/A 06/07/2014   Procedure: FLEXIBLE SIGMOIDOSCOPY;  Surgeon: Malissa Hippo, MD;  [facility]: [facility];  Service: Endoscopy;  Laterality: N/A;  . GIVENS CAPSULE STUDY  05/03/2011   Procedure: GIVENS CAPSULE STUDY;  Surgeon: Malissa Hippo, MD;  Location: [facility];  Service: Endoscopy;  Laterality: N/A;  7:30 am  . STOMACH SURGERY    . UPPER GASTROINTESTINAL ENDOSCOPY      Family History  Problem Relation Age of Onset  . Healthy Mother   .  Heart attack Father   . Healthy Sister   . Colon cancer Neg Hx    Social History:  reports that she has never smoked. She has never used smokeless tobacco. She reports that she does not drink alcohol or use drugs.  Allergies:  Allergies  Allergen Reactions  . Aspirin Other (See Comments)    Ulcer, Reflux  . Codeine Nausea And Vomiting    Speeds heart  up, dizziness  . Fentanyl Nausea And Vomiting    Confusion, weakness  . Nsaids Other (See Comments)    reflux, ulcer  . Propoxyphene N-Acetaminophen Nausea And Vomiting    Speeds heart up, dizziness    Medications Prior to Admission  Medication Sig Dispense Refill  . acetaminophen (TYLENOL) 500 MG tablet Take 1,000 mg by mouth every 6 (six) hours as needed for headache.    . albuterol (PROVENTIL HFA;VENTOLIN HFA) 108 (90 BASE) MCG/ACT inhaler Inhale 2 puffs into the lungs every 6 (six) hours as needed for wheezing or shortness of breath.     . ALPRAZolam (XANAX) 0.25 MG tablet Take 1 tablet (0.25 mg total) by mouth 3 (three) times daily as needed. Fill every 30 days 90 tablet 2  . Azelastine HCl 0.15 % SOLN Place 1 spray into the nose daily as needed (sinus congestion).     . Cholecalciferol (VITAMIN D3) 5000 UNITS CAPS Take 5,000 Units by mouth daily.     . cholestyramine (QUESTRAN) 4 G packet Take 1 packet (4 g total) by mouth 2 (two) times daily. Take 2 hours before or after taking other medicines. 60 each 2  . dicyclomine (BENTYL) 10 MG capsule Take 1 capsule (10 mg total) by mouth 2 (two) times daily as needed for spasms. 60 capsule 5  . ferrous gluconate (FERGON) 324 MG tablet Take 324 mg by mouth 2 (two) times daily with a meal.    . metoprolol tartrate (LOPRESSOR) 25 MG tablet TAKE ONE-HALF TABLET BY MOUTH TWICE DAILY 30 tablet 6  . mometasone (ASMANEX 120 METERED DOSES) 220 MCG/INH inhaler Inhale 2 puffs into the lungs daily as needed (shortness of breath).     . Multiple Vitamin (MULTIVITAMIN WITH MINERALS) TABS tablet Take 1 tablet by mouth daily.    . ondansetron (ZOFRAN) 4 MG tablet Take 1 tablet (4 mg total) by mouth every 12 (twelve) hours as needed for nausea. 30 tablet 0  . pantoprazole (PROTONIX) 40 MG tablet Take 1 tablet (40 mg total) by mouth daily. 30 tablet 5  . promethazine (PHENADOZ) 25 MG suppository Place 1 suppository (25 mg total) rectally 2 (two) times daily as  needed for nausea or vomiting. 12 each 1  . traMADol (ULTRAM) 50 MG tablet Take 1 tablet (50 mg total) by mouth 2 (two) times daily as needed. (Patient taking differently: Take 50 mg by mouth 2 (two) times daily as needed for moderate pain. ) 30 tablet 2  . diphenoxylate-atropine (LOMOTIL) 2.5-0.025 MG tablet Take 1 tablet by mouth 4 (four) times daily as needed for diarrhea or loose stools. 120 tablet 2  . FLUZONE QUADRIVALENT 0.5 ML injection Inject 0.5 mLs into the muscle.     . nitroGLYCERIN (NITROSTAT) 0.4 MG SL tablet Place 1 tablet (0.4 mg total) under the tongue every 5 (five) minutes as needed. Up to 3 doses. If no relief after 3 rd dose, proceed to ED for evaluation 25 tablet 3  . ranitidine (ZANTAC) 150 MG tablet Take 1 tablet (150 mg total) by mouth at bedtime. (Patient  taking differently: Take 150 mg by mouth daily as needed for heartburn. ) 30 tablet 5    No results found for this or any previous visit (from the past 48 hour(s)). No results found.  ROS  Blood pressure 133/82, pulse 78, temperature 98.6 F (37 C), temperature source Oral, resp. rate 14, height 5\' 4"  (1.626 m), weight 108 lb (49 kg), SpO2 100 %. Physical Exam  Constitutional:  Well-developed thin Caucasian female in NAD.  HENT:  Mouth/Throat: Oropharynx is clear and moist.  Eyes: Conjunctivae are normal. No scleral icterus.  Neck: No thyromegaly present.  Cardiovascular: Normal rate, regular rhythm and normal heart sounds.   No murmur heard. Respiratory: Effort normal and breath sounds normal.  GI:  Abdomen is flat and soft with mild tenderness in mid epigastrium and LLQ. No organomegaly or masses.  Musculoskeletal: She exhibits no edema.  Lymphadenopathy:    She has no cervical adenopathy.  Neurological: She is alert.  Skin: Skin is warm and dry.     Assessment/Plan Epigastric pain nausea and solid food dysphagia. EGD with ED.  Lionel DecemberNajeeb Layden Caterino, MD 08/04/2016, 12:49 PM

## 2016-08-11 ENCOUNTER — Encounter (HOSPITAL_COMMUNITY): Payer: Self-pay | Admitting: Internal Medicine

## 2016-08-20 ENCOUNTER — Other Ambulatory Visit (INDEPENDENT_AMBULATORY_CARE_PROVIDER_SITE_OTHER): Payer: Self-pay | Admitting: *Deleted

## 2016-08-20 DIAGNOSIS — R110 Nausea: Secondary | ICD-10-CM

## 2016-08-20 MED ORDER — ONDANSETRON HCL 4 MG PO TABS: 4.0000 mg | ORAL_TABLET | Freq: Two times a day (BID) | ORAL | 1 refills | Status: DC | PRN

## 2016-08-20 NOTE — Telephone Encounter (Signed)
Patient called and left a message that she needed a refill in her Zofran 4mg . She is having nausea and stomach problems.  Per Dr.Rehman approval a request for Zofran 4 mg  #20 with 1 refill was e-scribed to patient's pharmacy. Patient was made aware.

## 2016-10-27 ENCOUNTER — Other Ambulatory Visit (INDEPENDENT_AMBULATORY_CARE_PROVIDER_SITE_OTHER): Payer: Self-pay | Admitting: Internal Medicine

## 2016-10-27 DIAGNOSIS — F411 Generalized anxiety disorder: Secondary | ICD-10-CM

## 2016-12-06 ENCOUNTER — Encounter (INDEPENDENT_AMBULATORY_CARE_PROVIDER_SITE_OTHER): Payer: Self-pay | Admitting: *Deleted

## 2016-12-06 ENCOUNTER — Other Ambulatory Visit (INDEPENDENT_AMBULATORY_CARE_PROVIDER_SITE_OTHER): Payer: Self-pay | Admitting: *Deleted

## 2016-12-06 DIAGNOSIS — R7989 Other specified abnormal findings of blood chemistry: Secondary | ICD-10-CM

## 2016-12-06 DIAGNOSIS — D508 Other iron deficiency anemias: Secondary | ICD-10-CM

## 2016-12-06 DIAGNOSIS — G8929 Other chronic pain: Secondary | ICD-10-CM

## 2016-12-06 DIAGNOSIS — K219 Gastro-esophageal reflux disease without esophagitis: Secondary | ICD-10-CM

## 2016-12-06 DIAGNOSIS — R1013 Epigastric pain: Principal | ICD-10-CM

## 2017-01-04 ENCOUNTER — Ambulatory Visit (INDEPENDENT_AMBULATORY_CARE_PROVIDER_SITE_OTHER): Payer: BLUE CROSS/BLUE SHIELD | Admitting: Internal Medicine

## 2017-01-04 ENCOUNTER — Encounter (INDEPENDENT_AMBULATORY_CARE_PROVIDER_SITE_OTHER): Payer: Self-pay | Admitting: Internal Medicine

## 2017-01-04 ENCOUNTER — Other Ambulatory Visit (INDEPENDENT_AMBULATORY_CARE_PROVIDER_SITE_OTHER): Payer: Self-pay | Admitting: *Deleted

## 2017-01-04 VITALS — BP 98/64 | HR 68 | Temp 98.0°F | Resp 18 | Ht 64.0 in | Wt 109.4 lb

## 2017-01-04 DIAGNOSIS — E559 Vitamin D deficiency, unspecified: Secondary | ICD-10-CM

## 2017-01-04 DIAGNOSIS — R1013 Epigastric pain: Secondary | ICD-10-CM | POA: Diagnosis not present

## 2017-01-04 DIAGNOSIS — K580 Irritable bowel syndrome with diarrhea: Secondary | ICD-10-CM | POA: Diagnosis not present

## 2017-01-04 DIAGNOSIS — K219 Gastro-esophageal reflux disease without esophagitis: Secondary | ICD-10-CM

## 2017-01-04 DIAGNOSIS — K279 Peptic ulcer, site unspecified, unspecified as acute or chronic, without hemorrhage or perforation: Secondary | ICD-10-CM

## 2017-01-04 DIAGNOSIS — G8929 Other chronic pain: Secondary | ICD-10-CM

## 2017-01-04 DIAGNOSIS — R110 Nausea: Secondary | ICD-10-CM

## 2017-01-04 DIAGNOSIS — D509 Iron deficiency anemia, unspecified: Secondary | ICD-10-CM

## 2017-01-04 DIAGNOSIS — F411 Generalized anxiety disorder: Secondary | ICD-10-CM

## 2017-01-04 LAB — CBC
HEMATOCRIT: 38 % (ref 35.0–45.0)
Hemoglobin: 12 g/dL (ref 11.7–15.5)
MCH: 28.9 pg (ref 27.0–33.0)
MCHC: 31.6 g/dL — ABNORMAL LOW (ref 32.0–36.0)
MCV: 91.6 fL (ref 80.0–100.0)
MPV: 10 fL (ref 7.5–12.5)
PLATELETS: 194 10*3/uL (ref 140–400)
RBC: 4.15 MIL/uL (ref 3.80–5.10)
RDW: 14 % (ref 11.0–15.0)
WBC: 4.5 10*3/uL (ref 3.8–10.8)

## 2017-01-04 LAB — COMPREHENSIVE METABOLIC PANEL
ALK PHOS: 55 U/L (ref 33–130)
ALT: 12 U/L (ref 6–29)
AST: 17 U/L (ref 10–35)
Albumin: 3.9 g/dL (ref 3.6–5.1)
BUN: 9 mg/dL (ref 7–25)
CALCIUM: 9.2 mg/dL (ref 8.6–10.4)
CHLORIDE: 103 mmol/L (ref 98–110)
CO2: 25 mmol/L (ref 20–31)
Creat: 0.74 mg/dL (ref 0.50–0.99)
Glucose, Bld: 93 mg/dL (ref 65–99)
POTASSIUM: 4.1 mmol/L (ref 3.5–5.3)
Sodium: 142 mmol/L (ref 135–146)
TOTAL PROTEIN: 5.9 g/dL — AB (ref 6.1–8.1)
Total Bilirubin: 0.4 mg/dL (ref 0.2–1.2)

## 2017-01-04 LAB — IRON AND TIBC
%SAT: 26 % (ref 11–50)
IRON: 91 ug/dL (ref 45–160)
TIBC: 349 ug/dL (ref 250–450)
UIBC: 258 ug/dL (ref 125–400)

## 2017-01-04 LAB — FERRITIN: FERRITIN: 59 ng/mL (ref 20–288)

## 2017-01-04 MED ORDER — ALPRAZOLAM 0.25 MG PO TABS: 0.2500 mg | ORAL_TABLET | Freq: Three times a day (TID) | ORAL | 2 refills | Status: DC | PRN

## 2017-01-04 MED ORDER — ONDANSETRON HCL 4 MG PO TABS: 4.0000 mg | ORAL_TABLET | Freq: Two times a day (BID) | ORAL | 1 refills | Status: DC | PRN

## 2017-01-04 MED ORDER — DICYCLOMINE HCL 10 MG PO CAPS: 10.0000 mg | ORAL_CAPSULE | Freq: Two times a day (BID) | ORAL | 5 refills | Status: DC | PRN

## 2017-01-04 MED ORDER — RANITIDINE HCL 150 MG PO TABS: 150.0000 mg | ORAL_TABLET | Freq: Two times a day (BID) | ORAL | 5 refills | Status: DC

## 2017-01-04 MED ORDER — PROMETHAZINE HCL 25 MG RE SUPP: 25.0000 mg | Freq: Two times a day (BID) | RECTAL | 1 refills | Status: DC | PRN

## 2017-01-04 MED ORDER — PANTOPRAZOLE SODIUM 40 MG PO TBEC: 40.0000 mg | DELAYED_RELEASE_TABLET | Freq: Every day | ORAL | 5 refills | Status: DC

## 2017-01-04 NOTE — Patient Instructions (Signed)
Physician will call with results of blood work. 

## 2017-01-04 NOTE — Progress Notes (Signed)
Presenting complaint;  Follow-up for multiple problems..  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  She was last seen 6 months ago. She has multiple complaints. She has daily epigastric pain. She has intermittent postprandial nausea but no vomiting. Nausea at times appears when she has a bowel movement. She continues to complain of LLQ abdominal pain. She is having 3-4 stools per day. All of her stools are loose. She is taking Lomotil twice a day. She takes Questran on as-needed basis. She also complains of excessive flatus. She has heartburn once or twice a week. She states she has occasional blood on the tissue no more than once or twice a month. She denies frank rectal bleeding or melena. She is not having any difficulty. Her appetite is up. She has lost 2 pounds since her last visit. She needs multiple refills.    Current Medications: Outpatient Encounter Prescriptions as of 01/04/2017  Medication Sig  . acetaminophen (TYLENOL) 500 MG tablet Take 1,000 mg by mouth every 6 (six) hours as needed for headache.  . albuterol (PROVENTIL HFA;VENTOLIN HFA) 108 (90 BASE) MCG/ACT inhaler Inhale 2 puffs into the lungs every 6 (six) hours as needed for wheezing or shortness of breath.   . ALPRAZolam (XANAX) 0.25 MG tablet TAKE ONE TABLET BY MOUTH THREE TIMES DAILY AS NEEDED  . Azelastine HCl 0.15 % SOLN Place 1 spray into the nose daily as needed (sinus congestion).   . Cholecalciferol (VITAMIN D3) 5000 UNITS CAPS Take 5,000 Units by mouth daily.   . cholestyramine (QUESTRAN) 4 G packet Take 1 packet (4 g total) by mouth 2 (two) times daily. Take 2 hours before or after taking other medicines.  . dicyclomine (BENTYL) 10 MG capsule Take 1 capsule (10 mg  total) by mouth 2 (two) times daily as needed for spasms.  . diphenoxylate-atropine (LOMOTIL) 2.5-0.025 MG tablet Take 1 tablet by mouth 4 (four) times daily as needed for diarrhea or loose stools.  . ferrous gluconate (FERGON) 324 MG tablet Take 324 mg by mouth 2 (two) times daily with a meal.  . FLUZONE QUADRIVALENT 0.5 ML injection Inject 0.5 mLs into the muscle.   . metoprolol tartrate (LOPRESSOR) 25 MG tablet TAKE ONE-HALF TABLET BY MOUTH TWICE DAILY  . mometasone (ASMANEX 120 METERED DOSES) 220 MCG/INH inhaler Inhale 2 puffs into the lungs daily as needed (shortness of breath).   . Multiple Vitamin (MULTIVITAMIN WITH MINERALS) TABS tablet Take 1 tablet by mouth daily.  . nitroGLYCERIN (NITROSTAT) 0.4 MG SL tablet Place 1 tablet (0.4 mg total) under the tongue every 5 (five) minutes as needed. Up to 3 doses. If no relief after 3 rd dose, proceed to ED for evaluation  . ondansetron (ZOFRAN) 4 MG tablet Take 1 tablet (4 mg total) by mouth every 12 (twelve) hours as needed for nausea.  . pantoprazole (PROTONIX) 40 MG tablet Take 1 tablet (40 mg total) by mouth daily.  . promethazine (PHENADOZ) 25 MG suppository Place 1 suppository (25 mg total) rectally 2 (two) times daily as needed for nausea or vomiting.  . ranitidine (ZANTAC) 150 MG tablet Take 1 tablet (150 mg total) by mouth at bedtime. (Patient taking differently: Take 150 mg by mouth daily as needed for heartburn. )  . sucralfate (CARAFATE) 1 g tablet Take 2 tablets (  2 g total) by mouth at bedtime.  . traMADol (ULTRAM) 50 MG tablet Take 1 tablet (50 mg total) by mouth 2 (two) times daily as needed. (Patient taking differently: Take 50 mg by mouth 2 (two) times daily as needed for moderate pain. )   No facility-administered encounter medications on file as of 01/04/2017.      Objective: Blood pressure 98/64, pulse 68, temperature 98 F (36.7 C), temperature source Oral, resp. rate 18, height  (1.626 m), weight 109 lb 6.4 oz (49.6  kg). Patient is alert and in no acute distress. Conjunctiva is pink. Sclera is nonicteric Oropharyngeal mucosa is normal. No neck masses or thyromegaly noted. Cardiac exam with regular rhythm normal S1 and S2. No murmur or gallop noted. Lungs are clear to auscultation. Abdomen is flat. She has upper midline scar. Bowel sounds are normal. No bruit noted. On palpation she has mild generalized tenderness which is both superficial and deep. However she does not have organomegaly or masses. No LE edema or clubbing noted.   Assessment:  #1. Chronic epigastric pain. She has had multiple EGDs since her gastric surgery of November 2014 and found not to have recurrent peptic ulcer disease. Her pain can be considered as chronic dyspepsia. It may also be related to poor gastric emptying. #2. Nausea possibly secondary to post vagotomy gastroparesis. Presently she is on dietary measures. #3. GERD. She is presently on combination of pantoprazole and Zantac. Symptom control is not optimal and it may be due to gastroparesis. Antidiarrheal/anti-spasmodics may be making GERD symptoms worse. #4. Diarrhea. She has had diarrhea average before she had gastric surgery. Diarrhea is primarily due to IBS. #5. History of vitamin D deficiency. She has never responded to when high dose of by mouth vitamin D. #6. Anxiety state. She is due for alprazolam refill. #7. History of iron deficiency anemia most likely secondary to impaired iron absorption.   Plan:  Medication list updated. Refills given for diphenoxylate alprazolam dicyclomine pantoprazole sent back and promethazine suppository. Patient advised to take dicyclomine and diphenoxylate on schedule. Patient will go to the lab for CBC, comprehensive chemistry panel, serum iron, TIBC and ferritin as well as vitamin D level.

## 2017-01-05 LAB — VITAMIN D 25 HYDROXY (VIT D DEFICIENCY, FRACTURES): Vit D, 25-Hydroxy: 20 ng/mL — ABNORMAL LOW (ref 30–100)

## 2017-01-07 ENCOUNTER — Telehealth (INDEPENDENT_AMBULATORY_CARE_PROVIDER_SITE_OTHER): Payer: Self-pay | Admitting: *Deleted

## 2017-01-07 NOTE — Telephone Encounter (Signed)
Patient called and ask that both her's and her husband , larry's lab work drawn on 01/04/2017 be mailed to them. Dr.Rehman called them on 01/06/2017 and gave them their results.

## 2017-01-10 ENCOUNTER — Encounter: Payer: Self-pay | Admitting: Cardiovascular Disease

## 2017-01-10 ENCOUNTER — Ambulatory Visit (INDEPENDENT_AMBULATORY_CARE_PROVIDER_SITE_OTHER): Payer: BLUE CROSS/BLUE SHIELD | Admitting: Cardiovascular Disease

## 2017-01-10 VITALS — BP 110/78 | HR 66 | Ht 64.0 in | Wt 106.0 lb

## 2017-01-10 DIAGNOSIS — R002 Palpitations: Secondary | ICD-10-CM | POA: Diagnosis not present

## 2017-01-10 DIAGNOSIS — R55 Syncope and collapse: Secondary | ICD-10-CM

## 2017-01-10 DIAGNOSIS — I471 Supraventricular tachycardia: Secondary | ICD-10-CM

## 2017-01-10 MED ORDER — METOPROLOL TARTRATE 25 MG PO TABS
12.5000 mg | ORAL_TABLET | Freq: Two times a day (BID) | ORAL | 11 refills | Status: DC
Start: 2017-01-10 — End: 2018-01-23

## 2017-01-10 NOTE — Progress Notes (Signed)
SUBJECTIVE: The patient presents for follow up of PSVT and near syncope.  She has palpitations sometimes twice a day and sometimes not that frequently. She can feel a pounding sensation in her ears and in her head. She said her heart rate speeds up but it never goes very fast nor is sustained. She has had some episodes of dizziness if she stands up too quickly but denies syncope. She denies chest pain, shortness of breath, and leg swelling.  ECG performed in the office today which I ordered and personally interpreted demonstrates normal sinus rhythm with no ischemic ST segment or T-wave abnormalities, nor any arrhythmias.    Review of Systems: As per "subjective", otherwise negative.  Allergies  Allergen Reactions  . Aspirin Other (See Comments)    Ulcer, Reflux  . Codeine Nausea And Vomiting    Speeds heart up, dizziness  . Fentanyl Nausea And Vomiting    Confusion, weakness  . Nsaids Other (See Comments)    reflux, ulcer  . Propoxyphene N-Acetaminophen Nausea And Vomiting    Speeds heart up, dizziness    Current Outpatient Prescriptions  Medication Sig Dispense Refill  . acetaminophen (TYLENOL) 500 MG tablet Take 1,000 mg by mouth every 6 (six) hours as needed for headache.    . albuterol (PROVENTIL HFA;VENTOLIN HFA) 108 (90 BASE) MCG/ACT inhaler Inhale 2 puffs into the lungs every 6 (six) hours as needed for wheezing or shortness of breath.     . ALPRAZolam (XANAX) 0.25 MG tablet Take 1 tablet (0.25 mg total) by mouth 3 (three) times daily as needed. 90 tablet 2  . Azelastine HCl 0.15 % SOLN Place 1 spray into the nose daily as needed (sinus congestion).     . Cholecalciferol (VITAMIN D3) 5000 UNITS CAPS Take 5,000 Units by mouth daily.     . cholestyramine (QUESTRAN) 4 G packet Take 1 packet (4 g total) by mouth 2 (two) times daily. Take 2 hours before or after taking other medicines. 60 each 2  . dicyclomine (BENTYL) 10 MG capsule Take 1 capsule (10 mg total) by mouth 2  (two) times daily as needed for spasms. 60 capsule 5  . diphenoxylate-atropine (LOMOTIL) 2.5-0.025 MG tablet Take 1 tablet by mouth 4 (four) times daily as needed for diarrhea or loose stools. 120 tablet 2  . ferrous gluconate (FERGON) 324 MG tablet Take 324 mg by mouth as directed. Takes 2 times a week    . FLUZONE QUADRIVALENT 0.5 ML injection Inject 0.5 mLs into the muscle.     . metoprolol tartrate (LOPRESSOR) 25 MG tablet TAKE ONE-HALF TABLET BY MOUTH TWICE DAILY 30 tablet 6  . mometasone (ASMANEX 120 METERED DOSES) 220 MCG/INH inhaler Inhale 2 puffs into the lungs daily as needed (shortness of breath).     . Multiple Vitamin (MULTIVITAMIN WITH MINERALS) TABS tablet Take 1 tablet by mouth daily.    . ondansetron (ZOFRAN) 4 MG tablet Take 1 tablet (4 mg total) by mouth every 12 (twelve) hours as needed for nausea. 20 tablet 1  . pantoprazole (PROTONIX) 40 MG tablet Take 1 tablet (40 mg total) by mouth daily before breakfast. 30 tablet 5  . promethazine (PHENADOZ) 25 MG suppository Place 1 suppository (25 mg total) rectally 2 (two) times daily as needed for nausea or vomiting. 12 each 1  . ranitidine (ZANTAC) 150 MG tablet Take 1 tablet (150 mg total) by mouth 2 (two) times daily. 60 tablet 5  . sucralfate (CARAFATE) 1 g tablet  Take 2 tablets (2 g total) by mouth at bedtime. 60 tablet 3  . traMADol (ULTRAM) 50 MG tablet Take 1 tablet (50 mg total) by mouth 2 (two) times daily as needed. (Patient taking differently: Take 50 mg by mouth 2 (two) times daily as needed for moderate pain. ) 30 tablet 2   No current facility-administered medications for this visit.     Past Medical History:  Diagnosis Date  . Chest pain    Nuclear, normal, 2010,  //   hospital August, 2013 no evidence of injury  . Ejection fraction    EF 65%, echo, April 19, 2012  . Gastric ulcer   . GERD (gastroesophageal reflux disease)   . Irritable bowel syndrome   . Mitral regurgitation    Mild, echo, August, 2013  .  Palpitations   . Reactive airway disease    Pulmonary function studies from June, 2013, show the patient does have significant response to bronchodilators.  . Syncope    Vasovagal    Past Surgical History:  Procedure Laterality Date  . ABDOMINAL HYSTERECTOMY    . BIOPSY  08/04/2016   Procedure: BIOPSY;  Surgeon: Malissa Hippo, MD;  [facility];  Service: Endoscopy;;  gastric  . CHOLECYSTECTOMY    . COLONOSCOPY    . ESOPHAGEAL DILATION  06/07/2014   Procedure: ESOPHAGEAL DILATION;  Surgeon: Malissa Hippo, MD;  [facility];  Service: Endoscopy;;  . ESOPHAGEAL DILATION N/A 08/27/2015   Procedure: ESOPHAGEAL DILATION;  Surgeon: Malissa Hippo, MD;  [facility];  Service: Endoscopy;  Laterality: N/A;  . ESOPHAGEAL DILATION  08/04/2016   Procedure: ESOPHAGEAL DILATION;  Surgeon: Malissa Hippo, MD;  [facility];  Service: Endoscopy;;  . ESOPHAGOGASTRODUODENOSCOPY  02/04/2012   Procedure: ESOPHAGOGASTRODUODENOSCOPY (EGD);  Surgeon: Malissa Hippo, MD;  [facility];  Service: Endoscopy;  Laterality: N/A;  320  . ESOPHAGOGASTRODUODENOSCOPY  06/02/2012   Procedure: ESOPHAGOGASTRODUODENOSCOPY (EGD);  Surgeon: Malissa Hippo, MD;  [facility];  Service: Endoscopy;  Laterality: N/A;  1050  . ESOPHAGOGASTRODUODENOSCOPY N/A 06/07/2014   Procedure: ESOPHAGOGASTRODUODENOSCOPY (EGD);  Surgeon: Malissa Hippo, MD;  [facility];  Service: Endoscopy;  Laterality: N/A;  1035-rescheduled 9/25 @ 8:30 Ann to notify pt  . ESOPHAGOGASTRODUODENOSCOPY N/A 02/17/2015   Procedure: ESOPHAGOGASTRODUODENOSCOPY (EGD);  Surgeon: Malissa Hippo, MD;  [facility];  Service: Endoscopy;  Laterality: N/A;  730  . ESOPHAGOGASTRODUODENOSCOPY N/A 08/27/2015   Procedure: ESOPHAGOGASTRODUODENOSCOPY (EGD);  Surgeon: Malissa Hippo, MD;  [facility];  Service: Endoscopy;  Laterality: N/A;  1155  .  ESOPHAGOGASTRODUODENOSCOPY N/A 08/04/2016   Procedure: ESOPHAGOGASTRODUODENOSCOPY (EGD);  Surgeon: Malissa Hippo, MD;  [facility];  Service: Endoscopy;  Laterality: N/A;  225  . ESOPHAGOGASTRODUODENOSCOPY (EGD) WITH ESOPHAGEAL DILATION N/A 05/31/2013   Procedure: ESOPHAGOGASTRODUODENOSCOPY (EGD) WITH ESOPHAGEAL DILATION;  Surgeon: Malissa Hippo, MD;  [facility];  Service: Endoscopy;  Laterality: N/A;  . FLEXIBLE SIGMOIDOSCOPY N/A 05/31/2013   Procedure: FLEXIBLE SIGMOIDOSCOPY;  Surgeon: Malissa Hippo, MD;  [facility];  Service: Endoscopy;  Laterality: N/A;  250  . FLEXIBLE SIGMOIDOSCOPY N/A 06/07/2014   Procedure: FLEXIBLE SIGMOIDOSCOPY;  Surgeon: Malissa Hippo, MD;  [facility];  Service: Endoscopy;  Laterality: N/A;  . GIVENS CAPSULE STUDY  05/03/2011   Procedure: GIVENS CAPSULE STUDY;  Surgeon: Malissa Hippo, MD;  [facility];  Service: Endoscopy;  Laterality: N/A;  7:30 am  . STOMACH SURGERY    . UPPER GASTROINTESTINAL ENDOSCOPY      Social History   Social History  . Marital status: Married    Spouse name: N/A  . Number of children: N/A  . Years of education: N/A   Occupational History  . Not on file.   Social History Main Topics  . Smoking status: Never Smoker  . Smokeless tobacco: Never Used  . Alcohol use No  . Drug use: No  . Sexual activity: Not on file   Other Topics Concern  . Not on file   Social History Narrative  . No narrative on file     Vitals:   01/10/17 0844  BP: 110/78  Pulse: 66  SpO2: 94%  Weight: 106 lb (48.1 kg)  Height:  (1.626 m)    Wt Readings from Last 3 Encounters:  01/10/17 106 lb (48.1 kg)  01/04/17 109 lb 6.4 oz (49.6 kg)  08/04/16 108 lb (49 kg)     PHYSICAL EXAM General: NAD HEENT: Normal. Neck: No JVD, no thyromegaly. Lungs: Clear to auscultation bilaterally with normal respiratory effort. CV: Nondisplaced PMI.  Regular rate and rhythm, normal  S1/S2, no S3/S4, no murmur. No pretibial or periankle edema.  No carotid bruit.   Abdomen: Soft, nontender, no distention.  Neurologic: Alert and oriented.  Psych: Normal affect. Skin: Normal. Musculoskeletal: No gross deformities.    ECG: Most recent ECG reviewed.   Labs: Lab Results  Component Value Date/Time   K 4.1 01/04/2017 10:44 AM   BUN 9 01/04/2017 10:44 AM   CREATININE 0.74 01/04/2017 10:44 AM   ALT 12 01/04/2017 10:44 AM   TSH 1.006 06/17/2014 11:33 AM   HGB 12.0 01/04/2017 10:46 AM     Lipids: No results found for: LDLCALC, LDLDIRECT, CHOL, TRIG, HDL     ASSESSMENT AND PLAN: 1. Near syncope/dizziness: Symptomatically stable overall. Less frequent episodes than last year. No changes to therapy.  2. Palpitations/PSVT: Overall symptoms appear to be fairly well controlled on metoprolol 12.5 mg twice daily. Heart rate in 60 bpm range so I am not inclined to increase this dose.    Disposition: Follow up 1 year  Prentice Docker, M.D., F.A.C.C.

## 2017-01-10 NOTE — Patient Instructions (Signed)
Your physician wants you to follow-up in: 1 year Dr Koneswaran You will receive a reminder letter in the mail two months in advance. If you don't receive a letter, please call our office to schedule the follow-up appointment.     Your physician recommends that you continue on your current medications as directed. Please refer to the Current Medication list given to you today.    If you need a refill on your cardiac medications before your next appointment, please call your pharmacy.      Thank you for choosing [facility] !         

## 2017-01-11 ENCOUNTER — Other Ambulatory Visit (INDEPENDENT_AMBULATORY_CARE_PROVIDER_SITE_OTHER): Payer: Self-pay | Admitting: Internal Medicine

## 2017-01-11 DIAGNOSIS — R1013 Epigastric pain: Secondary | ICD-10-CM

## 2017-01-11 NOTE — Telephone Encounter (Signed)
Labs mailed to patient.

## 2017-02-08 ENCOUNTER — Telehealth (INDEPENDENT_AMBULATORY_CARE_PROVIDER_SITE_OTHER): Payer: Self-pay | Admitting: *Deleted

## 2017-02-08 NOTE — Telephone Encounter (Signed)
Patient called and states that she had a Physical at her PCP last week. She did a hemoccult and rec'd a call th is morning that it was positive. She says that Dr.Vyas ask that she call Dr.Rehman to see if she needs any medication,ect.

## 2017-02-08 NOTE — Telephone Encounter (Signed)
Forwarded to [employer]. Patient has NOT been called back with Dr.Rehman's recommendation. Thought it may need to be arranged first.

## 2017-02-08 NOTE — Telephone Encounter (Signed)
[facility]. [facility] and 2014 revealing rectal ulcer which probably is the source for heme-positive stool. Will proceed with diagnostic colonoscopy under propofol.

## 2017-02-09 ENCOUNTER — Other Ambulatory Visit (INDEPENDENT_AMBULATORY_CARE_PROVIDER_SITE_OTHER): Payer: Self-pay | Admitting: *Deleted

## 2017-02-09 NOTE — Telephone Encounter (Signed)
Per Dr.Rehman may call in the Procto cream ( Rectal ) 2.5 %. Patient is to apply to the affected area twice daily. Under 30 grams 1 refill. They stated that they would get it ready for the patient and let her know.

## 2017-02-10 ENCOUNTER — Encounter (INDEPENDENT_AMBULATORY_CARE_PROVIDER_SITE_OTHER): Payer: Self-pay | Admitting: *Deleted

## 2017-02-10 ENCOUNTER — Telehealth (INDEPENDENT_AMBULATORY_CARE_PROVIDER_SITE_OTHER): Payer: Self-pay | Admitting: *Deleted

## 2017-02-10 ENCOUNTER — Other Ambulatory Visit (INDEPENDENT_AMBULATORY_CARE_PROVIDER_SITE_OTHER): Payer: Self-pay | Admitting: *Deleted

## 2017-02-10 DIAGNOSIS — K625 Hemorrhage of anus and rectum: Secondary | ICD-10-CM

## 2017-02-10 MED ORDER — PEG 3350-KCL-NA BICARB-NACL 420 G PO SOLR: 4000.0000 mL | Freq: Once | ORAL | 0 refills | Status: AC

## 2017-02-10 NOTE — Telephone Encounter (Signed)
Patient needs trilyte 

## 2017-02-10 NOTE — Telephone Encounter (Signed)
TCS w propofol sch'd 03/04/17 at 1010, preop 6/18 at 1245, patient aware, instructions mailed

## 2017-02-23 NOTE — Patient Instructions (Signed)
Lindsay Lawrence  02/23/2017     @PREFPERIOPPHARMACY @   Your procedure is scheduled on 03/04/2017.  Report to [facility] at 8:40 A.M.  Call this number if you have problems the morning of surgery:  [phone]   Remember:  Do not eat food or drink liquids after midnight.  Take these medicines the morning of surgery with A SIP OF WATER Albuterol inhaler and bring with you, Xanax, Bentyl, Metoprolol, Ultram if needed,  Asmanex, Zofran if needed, Protonix, Zantac, Carafate   Do not wear jewelry, make-up or nail polish.  Do not wear lotions, powders, or perfumes, or deoderant.  Do not shave 48 hours prior to surgery.  Men may shave face and neck.  Do not bring valuables to the hospital.  [facility] is not responsible for any belongings or valuables.  Contacts, dentures or bridgework may not be worn into surgery.  Leave your suitcase in the car.  After surgery it may be brought to your room.  For patients admitted to the hospital, discharge time will be determined by your treatment team.  Patients discharged the day of surgery will not be allowed to drive home.    Please read over the following fact sheets that you were given. Anesthesia Post-op Instructions     PATIENT INSTRUCTIONS POST-ANESTHESIA  IMMEDIATELY FOLLOWING SURGERY:  Do not drive or operate machinery for the first twenty four hours after surgery.  Do not make any important decisions for twenty four hours after surgery or while taking narcotic pain medications or sedatives.  If you develop intractable nausea and vomiting or a severe headache please notify your doctor immediately.  FOLLOW-UP:  Please make an appointment with your surgeon as instructed. You do not need to follow up with anesthesia unless specifically instructed to do so.  WOUND CARE INSTRUCTIONS (if applicable):  Keep a dry clean dressing on the anesthesia/puncture wound site if there is drainage.  Once the wound has quit draining you may leave it  open to air.  Generally you should leave the bandage intact for twenty four hours unless there is drainage.  If the epidural site drains for more than 36-48 hours please call the anesthesia department.  QUESTIONS?:  Please feel free to call your physician or the hospital operator if you have any questions, and they will be happy to assist you.      Colonoscopy, Adult A colonoscopy is an exam to look at the entire large intestine. During the exam, a lubricated, bendable tube is inserted into the anus and then passed into the rectum, colon, and other parts of the large intestine. A colonoscopy is often done as a part of normal colorectal screening or in response to certain symptoms, such as anemia, persistent diarrhea, abdominal pain, and blood in the stool. The exam can help screen for and diagnose medical problems, including:  Tumors.  Polyps.  Inflammation.  Areas of bleeding.  Tell a health care provider about:  Any allergies you have.  All medicines you are taking, including vitamins, herbs, eye drops, creams, and over-the-counter medicines.  Any problems you or family members have had with anesthetic medicines.  Any blood disorders you have.  Any surgeries you have had.  Any medical conditions you have.  Any problems you have had passing stool. What are the risks? Generally, this is a safe procedure. However, problems may occur, including:  Bleeding.  A tear in the intestine.  A reaction to medicines given during the exam.  Infection (rare).  What happens before the procedure? Eating and drinking restrictions Follow instructions from your health care provider about eating and drinking, which may include:  A few days before the procedure - follow a low-fiber diet. Avoid nuts, seeds, dried fruit, raw fruits, and vegetables.  1-3 days before the procedure - follow a clear liquid diet. Drink only clear liquids, such as clear broth or bouillon, black coffee or tea,  clear juice, clear soft drinks or sports drinks, gelatin dessert, and popsicles. Avoid any liquids that contain red or purple dye.  On the day of the procedure - do not eat or drink anything during the 2 hours before the procedure, or within the time period that your health care provider recommends.  Bowel prep If you were prescribed an oral bowel prep to clean out your colon:  Take it as told by your health care provider. Starting the day before your procedure, you will need to drink a large amount of medicated liquid. The liquid will cause you to have multiple loose stools until your stool is almost clear or light green.  If your skin or anus gets irritated from diarrhea, you may use these to relieve the irritation: ? Medicated wipes, such as adult wet wipes with aloe and vitamin E. ? A skin soothing-product like petroleum jelly.  If you vomit while drinking the bowel prep, take a break for up to 60 minutes and then begin the bowel prep again. If vomiting continues and you cannot take the bowel prep without vomiting, call your health care provider.  General instructions  Ask your health care provider about changing or stopping your regular medicines. This is especially important if you are taking diabetes medicines or blood thinners.  Plan to have someone take you home from the hospital or clinic. What happens during the procedure?  An IV tube may be inserted into one of your veins.  You will be given medicine to help you relax (sedative).  To reduce your risk of infection: ? Your health care team will wash or sanitize their hands. ? Your anal area will be washed with soap.  You will be asked to lie on your side with your knees bent.  Your health care provider will lubricate a long, thin, flexible tube. The tube will have a camera and a light on the end.  The tube will be inserted into your anus.  The tube will be gently eased through your rectum and colon.  Air will be  delivered into your colon to keep it open. You may feel some pressure or cramping.  The camera will be used to take images during the procedure.  A small tissue sample may be removed from your body to be examined under a microscope (biopsy). If any potential problems are found, the tissue will be sent to a lab for testing.  If small polyps are found, your health care provider may remove them and have them checked for cancer cells.  The tube that was inserted into your anus will be slowly removed. The procedure may vary among health care providers and hospitals. What happens after the procedure?  Your blood pressure, heart rate, breathing rate, and blood oxygen level will be monitored until the medicines you were given have worn off.  Do not drive for 24 hours after the exam.  You may have a small amount of blood in your stool.  You may pass gas and have mild abdominal cramping or bloating due to the air that was used  to inflate your colon during the exam.  It is up to you to get the results of your procedure. Ask your health care provider, or the department performing the procedure, when your results will be ready. This information is not intended to replace advice given to you by your health care provider. Make sure you discuss any questions you have with your health care provider. Document Released: 08/27/2000 Document Revised: 06/30/2016 Document Reviewed: 11/11/2015 Elsevier Interactive Patient Education  2018 [employer].

## 2017-02-28 ENCOUNTER — Encounter (HOSPITAL_COMMUNITY): Payer: Self-pay

## 2017-02-28 ENCOUNTER — Encounter (HOSPITAL_COMMUNITY)
Admission: RE | Admit: 2017-02-28 | Discharge: 2017-02-28 | Disposition: A | Discharge status: Home / Self Care | Payer: BLUE CROSS/BLUE SHIELD | Source: Ambulatory Visit | Attending: Internal Medicine | Admitting: Internal Medicine

## 2017-02-28 DIAGNOSIS — Z01818 Encounter for other preprocedural examination: Secondary | ICD-10-CM | POA: Diagnosis not present

## 2017-02-28 DIAGNOSIS — K625 Hemorrhage of anus and rectum: Secondary | ICD-10-CM | POA: Diagnosis not present

## 2017-02-28 HISTORY — DX: Anemia, unspecified: D64.9

## 2017-02-28 HISTORY — DX: Personal history of other specified conditions: Z87.898

## 2017-02-28 HISTORY — DX: Anxiety disorder, unspecified: F41.9

## 2017-02-28 LAB — CBC
HCT: 44.1 % (ref 36.0–46.0)
HEMOGLOBIN: 14.3 g/dL (ref 12.0–15.0)
MCH: 29.8 pg (ref 26.0–34.0)
MCHC: 32.4 g/dL (ref 30.0–36.0)
MCV: 91.9 fL (ref 78.0–100.0)
Platelets: 312 10*3/uL (ref 150–400)
RBC: 4.8 MIL/uL (ref 3.87–5.11)
RDW: 12.6 % (ref 11.5–15.5)
WBC: 8.9 10*3/uL (ref 4.0–10.5)

## 2017-02-28 LAB — BASIC METABOLIC PANEL
ANION GAP: 8 (ref 5–15)
BUN: 11 mg/dL (ref 6–20)
CHLORIDE: 101 mmol/L (ref 101–111)
CO2: 30 mmol/L (ref 22–32)
Calcium: 9.4 mg/dL (ref 8.9–10.3)
Creatinine, Ser: 0.77 mg/dL (ref 0.44–1.00)
GFR calc Af Amer: >60 mL/min (ref >60)
GFR calc non Af Amer: >60 mL/min (ref >60)
Glucose, Bld: 106 mg/dL — ABNORMAL HIGH (ref 65–99)
POTASSIUM: 4 mmol/L (ref 3.5–5.1)
SODIUM: 139 mmol/L (ref 135–145)

## 2017-03-04 ENCOUNTER — Ambulatory Visit (HOSPITAL_COMMUNITY)
Admission: RE | Admit: 2017-03-04 | Discharge: 2017-03-04 | Disposition: A | Discharge status: Home / Self Care | Payer: BLUE CROSS/BLUE SHIELD | Source: Ambulatory Visit | Attending: Internal Medicine | Admitting: Internal Medicine

## 2017-03-04 ENCOUNTER — Ambulatory Visit (HOSPITAL_COMMUNITY): Payer: BLUE CROSS/BLUE SHIELD | Admitting: Anesthesiology

## 2017-03-04 ENCOUNTER — Encounter (HOSPITAL_COMMUNITY): Payer: Self-pay | Admitting: *Deleted

## 2017-03-04 ENCOUNTER — Encounter (HOSPITAL_COMMUNITY)
Admission: RE | Disposition: A | Discharge status: Home / Self Care | Payer: Self-pay | Source: Ambulatory Visit | Attending: Internal Medicine

## 2017-03-04 DIAGNOSIS — K580 Irritable bowel syndrome with diarrhea: Secondary | ICD-10-CM | POA: Diagnosis not present

## 2017-03-04 DIAGNOSIS — Z79899 Other long term (current) drug therapy: Secondary | ICD-10-CM | POA: Diagnosis not present

## 2017-03-04 DIAGNOSIS — Z8711 Personal history of peptic ulcer disease: Secondary | ICD-10-CM | POA: Diagnosis not present

## 2017-03-04 DIAGNOSIS — Z8719 Personal history of other diseases of the digestive system: Secondary | ICD-10-CM | POA: Insufficient documentation

## 2017-03-04 DIAGNOSIS — J45909 Unspecified asthma, uncomplicated: Secondary | ICD-10-CM | POA: Insufficient documentation

## 2017-03-04 DIAGNOSIS — K219 Gastro-esophageal reflux disease without esophagitis: Secondary | ICD-10-CM | POA: Insufficient documentation

## 2017-03-04 DIAGNOSIS — K625 Hemorrhage of anus and rectum: Secondary | ICD-10-CM | POA: Insufficient documentation

## 2017-03-04 DIAGNOSIS — F419 Anxiety disorder, unspecified: Secondary | ICD-10-CM | POA: Diagnosis not present

## 2017-03-04 DIAGNOSIS — K5730 Diverticulosis of large intestine without perforation or abscess without bleeding: Secondary | ICD-10-CM | POA: Diagnosis not present

## 2017-03-04 DIAGNOSIS — K644 Residual hemorrhoidal skin tags: Secondary | ICD-10-CM | POA: Diagnosis not present

## 2017-03-04 DIAGNOSIS — D649 Anemia, unspecified: Secondary | ICD-10-CM | POA: Insufficient documentation

## 2017-03-04 DIAGNOSIS — K6289 Other specified diseases of anus and rectum: Secondary | ICD-10-CM | POA: Diagnosis not present

## 2017-03-04 DIAGNOSIS — R195 Other fecal abnormalities: Secondary | ICD-10-CM | POA: Insufficient documentation

## 2017-03-04 DIAGNOSIS — Z9049 Acquired absence of other specified parts of digestive tract: Secondary | ICD-10-CM | POA: Diagnosis not present

## 2017-03-04 HISTORY — PX: COLONOSCOPY WITH PROPOFOL: SHX5780

## 2017-03-04 SURGERY — COLONOSCOPY WITH PROPOFOL
Anesthesia: Monitor Anesthesia Care

## 2017-03-04 MED ORDER — MIDAZOLAM HCL 2 MG/2ML IJ SOLN
1.0000 mg | INTRAMUSCULAR | Status: AC
  Administered 2017-03-04 (×2): 1 mg via INTRAVENOUS

## 2017-03-04 MED ORDER — PROPOFOL 500 MG/50ML IV EMUL
INTRAVENOUS | Status: DC | PRN
  Administered 2017-03-04: 150 ug/kg/min via INTRAVENOUS
  Administered 2017-03-04: 11:00:00 via INTRAVENOUS

## 2017-03-04 MED ORDER — CHLORHEXIDINE GLUCONATE CLOTH 2 % EX PADS: 6.0000 | MEDICATED_PAD | Freq: Once | CUTANEOUS | Status: DC

## 2017-03-04 MED ORDER — LACTATED RINGERS IV SOLN
INTRAVENOUS | Status: DC
  Administered 2017-03-04: 10:00:00 via INTRAVENOUS

## 2017-03-04 MED ORDER — MIDAZOLAM HCL 2 MG/2ML IJ SOLN
INTRAMUSCULAR | Status: AC
  Filled 2017-03-04: qty 2

## 2017-03-04 NOTE — Anesthesia Procedure Notes (Signed)
Procedure Name: MAC Date/Time: 03/04/2017 10:43 AM Performed by: Andree Elk, AMY A Pre-anesthesia Checklist: Patient identified, Emergency Drugs available, Suction available, Patient being monitored and Timeout performed Oxygen Delivery Method: Simple face mask

## 2017-03-04 NOTE — H&P (Signed)
Lindsay Lawrence is an 61 y.o. female.   Chief Complaint: Patient is here for colonoscopy. HPI: Patient is 61 yo Caucasian female was noted to have heme-positive stool and therefore undergoing diagnostic colonoscopy. She has chronic diarrhea. She has been diagnosed with IBS in the past. 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 She reports chronic hematochezia which occurs about twice a month always with her bowel movements and she passes small amount of blood. She has history of solitary rectal ulcer. Last colonoscopy was 5 years ago at [facility]. She continues to complain of upper and lower abdominal pain. About 2 or 3 weeks ago she had an episode of excruciating pain she describes as different than spasm. She says pain lasted about 30 minutes. She was planning to go to emergency room the pain resolved. She did not experience fever dysuria or vaginal discharge  Past Medical History:  Diagnosis Date  . Anemia   . Anxiety   . Chest pain    Nuclear, normal, 2010,  //   hospital August, 2013 no evidence of injury  . Ejection fraction    EF 65%, echo, April 19, 2012  . Gastric ulcer   . GERD (gastroesophageal reflux disease)   . History of palpitations   . Irritable bowel syndrome   . Mitral regurgitation    Mild, echo, August, 2013  . Palpitations   . Reactive airway disease    Pulmonary function studies from June, 2013, show the patient does have significant response to bronchodilators.  . Syncope    Vasovagal    Past Surgical History:  Procedure Laterality Date  . ABDOMINAL HYSTERECTOMY    . BIOPSY  08/04/2016   Procedure: BIOPSY;  Surgeon: Malissa Hippo, MD;  [facility];  Service: Endoscopy;;  gastric  . CHOLECYSTECTOMY    . COLONOSCOPY    . ESOPHAGEAL DILATION  06/07/2014   Procedure: ESOPHAGEAL DILATION;  Surgeon: Malissa Hippo, MD;  [facility];  Service: Endoscopy;;  . ESOPHAGEAL DILATION N/A  08/27/2015   Procedure: ESOPHAGEAL DILATION;  Surgeon: Malissa Hippo, MD;  [facility];  Service: Endoscopy;  Laterality: N/A;  . ESOPHAGEAL DILATION  08/04/2016   Procedure: ESOPHAGEAL DILATION;  Surgeon: Malissa Hippo, MD;  [facility];  Service: Endoscopy;;  . ESOPHAGOGASTRODUODENOSCOPY  02/04/2012   Procedure: ESOPHAGOGASTRODUODENOSCOPY (EGD);  Surgeon: Malissa Hippo, MD;  [facility];  Service: Endoscopy;  Laterality: N/A;  320  . ESOPHAGOGASTRODUODENOSCOPY  06/02/2012   Procedure: ESOPHAGOGASTRODUODENOSCOPY (EGD);  Surgeon: Malissa Hippo, MD;  [facility];  Service: Endoscopy;  Laterality: N/A;  1050  . ESOPHAGOGASTRODUODENOSCOPY N/A 06/07/2014   Procedure: ESOPHAGOGASTRODUODENOSCOPY (EGD);  Surgeon: Malissa Hippo, MD;  [facility];  Service: Endoscopy;  Laterality: N/A;  1035-rescheduled 9/25 @ 8:30 Ann to notify pt  . ESOPHAGOGASTRODUODENOSCOPY N/A 02/17/2015   Procedure: ESOPHAGOGASTRODUODENOSCOPY (EGD);  Surgeon: Malissa Hippo, MD;  [facility];  Service: Endoscopy;  Laterality: N/A;  730  . ESOPHAGOGASTRODUODENOSCOPY N/A 08/27/2015   Procedure: ESOPHAGOGASTRODUODENOSCOPY (EGD);  Surgeon: Malissa Hippo, MD;  [facility];  Service: Endoscopy;  Laterality: N/A;  1155  . ESOPHAGOGASTRODUODENOSCOPY N/A 08/04/2016   Procedure: ESOPHAGOGASTRODUODENOSCOPY (EGD);  Surgeon: Malissa Hippo, MD;  [facility];  Service: Endoscopy;  Laterality: N/A;  225  . ESOPHAGOGASTRODUODENOSCOPY (EGD) WITH ESOPHAGEAL DILATION N/A 05/31/2013   Procedure: ESOPHAGOGASTRODUODENOSCOPY (EGD)  WITH ESOPHAGEAL DILATION;  Surgeon: Malissa Hippo, MD;  [facility];  Service: Endoscopy;  Laterality: N/A;  . FLEXIBLE SIGMOIDOSCOPY N/A 05/31/2013   Procedure: FLEXIBLE SIGMOIDOSCOPY;  Surgeon: Malissa Hippo, MD;  [facility];  Service: Endoscopy;  Laterality: N/A;  250  . FLEXIBLE SIGMOIDOSCOPY  N/A 06/07/2014   Procedure: FLEXIBLE SIGMOIDOSCOPY;  Surgeon: Malissa Hippo, MD;  [facility];  Service: Endoscopy;  Laterality: N/A;  . GIVENS CAPSULE STUDY  05/03/2011   Procedure: GIVENS CAPSULE STUDY;  Surgeon: Malissa Hippo, MD;  [facility];  Service: Endoscopy;  Laterality: N/A;  7:30 am  . STOMACH SURGERY    . UPPER GASTROINTESTINAL ENDOSCOPY      Family History  Problem Relation Age of Onset  . Healthy Mother   . Heart attack Father   . Healthy Sister   . Colon cancer Neg Hx    Social History:  reports that she has never smoked. She has never used smokeless tobacco. She reports that she does not drink alcohol or use drugs.  Allergies:  Allergies  Allergen Reactions  . Aspirin Other (See Comments)    Ulcer, Reflux  . Codeine Nausea And Vomiting    Speeds heart up, dizziness  . Fentanyl Nausea And Vomiting    Confusion, weakness  . Nsaids Other (See Comments)    reflux, ulcer  . Propoxyphene N-Acetaminophen Nausea And Vomiting    Speeds heart up, dizziness    Medications Prior to Admission  Medication Sig Dispense Refill  . acetaminophen (TYLENOL) 500 MG tablet Take 1,000 mg by mouth every 6 (six) hours as needed for headache.    . ALPRAZolam (XANAX) 0.25 MG tablet Take 1 tablet (0.25 mg total) by mouth 3 (three) times daily as needed. 90 tablet 2  . Azelastine HCl 0.15 % SOLN Place 1 spray into the nose daily as needed (sinus congestion).     . Cholecalciferol (VITAMIN D3) 5000 UNITS CAPS Take 5,000 Units by mouth daily.     . cholestyramine (QUESTRAN) 4 G packet Take 1 packet (4 g total) by mouth 2 (two) times daily. Take 2 hours before or after taking other medicines. (Patient taking differently: Take 4 g by mouth 2 (two) times daily as needed. Takes if LOMOTIL does not work) 60 each 2  . dicyclomine (BENTYL) 10 MG capsule Take 1 capsule (10 mg total) by mouth 2 (two) times daily as needed for spasms. 60 capsule 5  . diphenoxylate-atropine  (LOMOTIL) 2.5-0.025 MG tablet Take 1 tablet by mouth 4 (four) times daily as needed for diarrhea or loose stools. 120 tablet 2  . ferrous gluconate (FERGON) 324 MG tablet Take 324 mg by mouth as directed. Takes 2 times a week    . metoprolol tartrate (LOPRESSOR) 25 MG tablet Take 0.5 tablets (12.5 mg total) by mouth 2 (two) times daily. 30 tablet 11  . mometasone (ASMANEX 120 METERED DOSES) 220 MCG/INH inhaler Inhale 2 puffs into the lungs daily as needed (shortness of breath).     . ondansetron (ZOFRAN) 4 MG tablet Take 1 tablet (4 mg total) by mouth every 12 (twelve) hours as needed for nausea. 20 tablet 1  . pantoprazole (PROTONIX) 40 MG tablet Take 1 tablet (40 mg total) by mouth daily before breakfast. (Patient taking differently: Take 40 mg by mouth daily as needed. ) 30 tablet 5  . PROCTO-MED HC 2.5 % rectal cream Apply 1 application topically daily as needed.     Marland Kitchen  ranitidine (ZANTAC) 150 MG tablet Take 1 tablet (150 mg total) by mouth 2 (two) times daily. (Patient taking differently: Take 150 mg by mouth daily as needed for heartburn. ) 60 tablet 5  . sucralfate (CARAFATE) 1 g tablet Take 2 tablets (2 g total) by mouth at bedtime. 60 tablet 3  . traMADol (ULTRAM) 50 MG tablet TAKE ONE TABLET BY MOUTH TWICE DAILY AS NEEDED 60 tablet 0  . albuterol (PROVENTIL HFA;VENTOLIN HFA) 108 (90 BASE) MCG/ACT inhaler Inhale 2 puffs into the lungs every 6 (six) hours as needed for wheezing or shortness of breath.     . Multiple Vitamin (MULTIVITAMIN WITH MINERALS) TABS tablet Take 1 tablet by mouth daily.    . promethazine (PHENADOZ) 25 MG suppository Place 1 suppository (25 mg total) rectally 2 (two) times daily as needed for nausea or vomiting. 12 each 1    No results found for this or any previous visit (from the past 48 hour(s)). No results found.  ROS  Blood pressure 110/73, pulse 60, temperature 98 F (36.7 C), resp. rate (!) 24, height 5\' 4"  (1.626 m), weight 103 lb (46.7 kg), SpO2 100  %. Physical Exam  Constitutional:  WD thin Caucasian female in NAD.  HENT:  Mouth/Throat: Oropharynx is clear and moist.  Eyes: Conjunctivae are normal. No scleral icterus.  Neck: No thyromegaly present.  Cardiovascular: Normal rate, regular rhythm and normal heart sounds.   No murmur heard. Respiratory: Effort normal and breath sounds normal.  GI:  Abdomen is flat with upper midline scar. Abdomen is soft with mild generalized tenderness which is more so at epigastrium. No organomegaly or masses.  Musculoskeletal: She exhibits no edema.  Lymphadenopathy:    She has no cervical adenopathy.  Neurological: She is alert.  Skin: Skin is warm and dry.     Assessment/Plan Heme positive stool. History of hematochezia and rectal ulcer Diagnostic colonoscopy.  Lionel DecemberNajeeb Rehman, MD 03/04/2017, 10:40 AM

## 2017-03-04 NOTE — Op Note (Addendum)
[facility] Patient Name: Lindsay Lawrence Procedure Date: 03/04/2017 10:21 AM MRN: [id number] Date of Birth: 1956/04/06 Attending MD: Lionel December , MD CSN: [id number] Age: 61 Admit Type: Outpatient Procedure:                Colonoscopy Indications:              Heme positive stool, Diarrhea Providers:                Lionel December, MD, Judee Clara, RN, Burke Keels, Technician Referring MD:             Ignatius Specking, MD Medicines:                Propofol per Anesthesia Complications:            No immediate complications. Estimated Blood Loss:     Estimated blood loss was minimal. Procedure:                Pre-Anesthesia Assessment:                           - Prior to the procedure, a History and Physical                            was performed, and patient medications and                            allergies were reviewed. The patient's tolerance of                            previous anesthesia was also reviewed. The risks                            and benefits of the procedure and the sedation                            options and risks were discussed with the patient.                            All questions were answered, and informed consent                            was obtained. Prior Anticoagulants: The patient has                            taken no previous anticoagulant or antiplatelet                            agents. ASA Grade Assessment: III - A patient with                            severe systemic disease. After reviewing the risks  and benefits, the patient was deemed in                            satisfactory condition to undergo the procedure.                           After obtaining informed consent, the colonoscope                            was passed under direct vision. Throughout the                            procedure, the patient's blood pressure, pulse, and   oxygen saturations were monitored continuously. The                            EC-[id number] ([id number]) scope was introduced through                            the anus and advanced to the the cecum, identified                            by appendiceal orifice and ileocecal valve. The                            colonoscopy was performed without difficulty. The                            patient tolerated the procedure well. The quality                            of the bowel preparation was good. The ileocecal                            valve, appendiceal orifice, and rectum were                            photographed. Scope In: 10:51:27 AM Scope Out: 11:12:57 AM Scope Withdrawal Time: 0 hours 6 minutes 16 seconds  Total Procedure Duration: 0 hours 21 minutes 30 seconds  Findings:      The perianal and digital rectal examinations were normal.      A few small-mouthed diverticula were found in the colon.      The colon (entire examined portion) appeared normal. Biopsies were taken       with a cold forceps for histology.      A healed ulcer was found in the rectum.      External hemorrhoids were found during retroflexion. The hemorrhoids       were small.      Anal papilla(e) were hypertrophied. Impression:               - Diverticulosis.                           - The entire examined colon is normal. Random  biopsies taken from mucosa of sigmoid colon.                           - Scar in the rectum.                           - External hemorrhoids.                           - Anal papilla(e) were hypertrophied. Moderate Sedation:      Per Anesthesia Care Recommendation:           - Patient has a contact number available for                            emergencies. The signs and symptoms of potential                            delayed complications were discussed with the                            patient. Return to normal activities tomorrow.                             Written discharge instructions were provided to the                            patient.                           - Resume previous diet today.                           - Continue present medications.                           - Await pathology results.                           - Repeat colonoscopy in 10 years for screening                            purposes. Procedure Code(s):        --- Professional ---                           [id number], Colonoscopy, flexible; with biopsy, single                            or multiple Diagnosis Code(s):        --- Professional ---                           K64.4, Residual hemorrhoidal skin tags                           K62.89, Other specified diseases of anus and rectum  R19.5, Other fecal abnormalities                           R19.7, Diarrhea, unspecified                           K57.30, Diverticulosis of large intestine without                            perforation or abscess without bleeding CPT copyright 2016 American [employer] Association. All rights reserved. The codes documented in this report are preliminary and upon coder review may  be revised to meet current compliance requirements. Lionel December, MD Lionel December, MD 03/04/2017 11:23:27 AM This report has been signed electronically. Number of Addenda: 0

## 2017-03-04 NOTE — Discharge Instructions (Signed)
Resume usual medications and diet. No driving for 24 hours. Physician will call with biopsy results.     PATIENT INSTRUCTIONS POST-ANESTHESIA  IMMEDIATELY FOLLOWING SURGERY:  Do not drive or operate machinery for the first twenty four hours after surgery.  Do not make any important decisions for twenty four hours after surgery or while taking narcotic pain medications or sedatives.  If you develop intractable nausea and vomiting or a severe headache please notify your doctor immediately.  FOLLOW-UP:  Please make an appointment with your surgeon as instructed. You do not need to follow up with anesthesia unless specifically instructed to do so.  WOUND CARE INSTRUCTIONS (if applicable):  Keep a dry clean dressing on the anesthesia/puncture wound site if there is drainage.  Once the wound has quit draining you may leave it open to air.  Generally you should leave the bandage intact for twenty four hours unless there is drainage.  If the epidural site drains for more than 36-48 hours please call the anesthesia department.  QUESTIONS?:  Please feel free to call your physician or the hospital operator if you have any questions, and they will be happy to assist you.        Colonoscopy, Adult, Care After This sheet gives you information about how to care for yourself after your procedure. Your doctor may also give you more specific instructions. If you have problems or questions, call your doctor. Follow these instructions at home: General instructions   For the first 24 hours after the procedure: ? Do not drive or use machinery. ? Do not sign important documents. ? Do not drink alcohol. ? Do your daily activities more slowly than normal. ? Eat foods that are soft and easy to digest. ? Rest often.  Take over-the-counter or prescription medicines only as told by your doctor.  It is up to you to get the results of your procedure. Ask your doctor, or the department performing the procedure,  when your results will be ready. To help cramping and bloating:  Try walking around.  Put heat on your belly (abdomen) as told by your doctor. Use a heat source that your doctor recommends, such as a moist heat pack or a heating pad. ? Put a towel between your skin and the heat source. ? Leave the heat on for 20-30 minutes. ? Remove the heat if your skin turns bright red. This is especially important if you cannot feel pain, heat, or cold. You can get burned. Eating and drinking  Drink enough fluid to keep your pee (urine) clear or pale yellow.  Return to your normal diet as told by your doctor. Avoid heavy or fried foods that are hard to digest.  Avoid drinking alcohol for as long as told by your doctor. Contact a doctor if:  You have blood in your poop (stool) 2-3 days after the procedure. Get help right away if:  You have more than a small amount of blood in your poop.  You see large clumps of tissue (blood clots) in your poop.  Your belly is swollen.  You feel sick to your stomach (nauseous).  You throw up (vomit).  You have a fever.  You have belly pain that gets worse, and medicine does not help your pain. This information is not intended to replace advice given to you by your health care provider. Make sure you discuss any questions you have with your health care provider. Document Released: 10/02/2010 Document Revised: 05/24/2016 Document Reviewed: 05/24/2016 Elsevier Interactive  Patient Education  2017 [employer]     Diverticulosis Diverticulosis is a condition that develops when small pouches (diverticula) form in the wall of the large intestine (colon). The colon is where water is absorbed and stool is formed. The pouches form when the inside layer of the colon pushes through weak spots in the outer layers of the colon. You may have a few pouches or many of them. What are the causes? The cause of this condition is not known. What increases the risk? The  following factors may make you more likely to develop this condition:  Being older than age 25. Your risk for this condition increases with age. Diverticulosis is rare among people younger than age 86. By age 66, many people have it.  Eating a low-fiber diet.  Having frequent constipation.  Being overweight.  Not getting enough exercise.  Smoking.  Taking over-the-counter pain medicines, like aspirin and ibuprofen.  Having a family history of diverticulosis.  What are the signs or symptoms? In most people, there are no symptoms of this condition. If you do have symptoms, they may include:  Bloating.  Cramps in the abdomen.  Constipation or diarrhea.  Pain in the lower left side of the abdomen.  How is this diagnosed? This condition is most often diagnosed during an exam for other colon problems. Because diverticulosis usually has no symptoms, it often cannot be diagnosed independently. This condition may be diagnosed by:  Using a flexible scope to examine the colon (colonoscopy).  Taking an X-ray of the colon after dye has been put into the colon (barium enema).  Doing a CT scan.  How is this treated? You may not need treatment for this condition if you have never developed an infection related to diverticulosis. If you have had an infection before, treatment may include:  Eating a high-fiber diet. This may include eating more fruits, vegetables, and grains.  Taking a fiber supplement.  Taking a live bacteria supplement (probiotic).  Taking medicine to relax your colon.  Taking antibiotic medicines.  Follow these instructions at home:  Drink 6-8 glasses of water or more each day to prevent constipation.  Try not to strain when you have a bowel movement.  If you have had an infection before: ? Eat more fiber as directed by your health care provider or your diet and nutrition specialist (dietitian). ? Take a fiber supplement or probiotic, if your health care  provider approves.  Take over-the-counter and prescription medicines only as told by your health care provider.  If you were prescribed an antibiotic, take it as told by your health care provider. Do not stop taking the antibiotic even if you start to feel better.  Keep all follow-up visits as told by your health care provider. This is important. Contact a health care provider if:  You have pain in your abdomen.  You have bloating.  You have cramps.  You have not had a bowel movement in 3 days. Get help right away if:  Your pain gets worse.  Your bloating becomes very bad.  You have a fever or chills, and your symptoms suddenly get worse.  You vomit.  You have bowel movements that are bloody or black.  You have bleeding from your rectum. Summary  Diverticulosis is a condition that develops when small pouches (diverticula) form in the wall of the large intestine (colon).  You may have a few pouches or many of them.  This condition is most often diagnosed during an  exam for other colon problems.  If you have had an infection related to diverticulosis, treatment may include increasing the fiber in your diet, taking supplements, or taking medicines. This information is not intended to replace advice given to you by your health care provider. Make sure you discuss any questions you have with your health care provider. Document Released: 05/27/2004 Document Revised: 07/19/2016 Document Reviewed: 07/19/2016 Elsevier Interactive Patient Education  2017 [employer].

## 2017-03-04 NOTE — Anesthesia Postprocedure Evaluation (Signed)
Anesthesia Post Note  Patient: Lindsay Lawrence  Procedure(s) Performed: Procedure(s) (LRB): COLONOSCOPY WITH PROPOFOL (N/A)  Patient location during evaluation: PACU Anesthesia Type: MAC Level of consciousness: awake and alert, oriented and patient cooperative Pain management: pain level controlled Vital Signs Assessment: post-procedure vital signs reviewed and stable Respiratory status: patient connected to nasal cannula oxygen, spontaneous breathing and respiratory function stable Cardiovascular status: stable Postop Assessment: no signs of nausea or vomiting Anesthetic complications: no     Last Vitals:  Vitals:   03/04/17 1025 03/04/17 1120  BP:  98/66  Pulse:  62  Resp: (!) 24   Temp:  36.5 C    Last Pain:  Vitals:   03/04/17 0900  PainSc: 6                  ADAMS, AMY A

## 2017-03-04 NOTE — Anesthesia Preprocedure Evaluation (Signed)
Anesthesia Evaluation  Patient identified by MRN, date of birth, ID band Patient awake    Reviewed: Allergy & Precautions, NPO status , Patient's Chart, lab work & pertinent test results  Airway Mallampati: II  TM Distance: >3 FB Neck ROM: Full    Dental  (+) Teeth Intact   Pulmonary shortness of breath and with exertion,  Reactive airway disease   breath sounds clear to auscultation       Cardiovascular + Valvular Problems/Murmurs MR  Rhythm:Regular Rate:Normal  CHEST PAIN, ATYPICAL   Neuro/Psych PSYCHIATRIC DISORDERS Anxiety    GI/Hepatic PUD, GERD  ,  Endo/Other    Renal/GU      Musculoskeletal   Abdominal   Peds  Hematology   Anesthesia Other Findings   Reproductive/Obstetrics                            Anesthesia Physical Anesthesia Plan  ASA: III  Anesthesia Plan: MAC   Post-op Pain Management:    Induction: Intravenous  PONV Risk Score and Plan:   Airway Management Planned: Simple Face Mask  Additional Equipment:   Intra-op Plan:   Post-operative Plan:   Informed Consent: I have reviewed the patients History and Physical, chart, labs and discussed the procedure including the risks, benefits and alternatives for the proposed anesthesia with the patient or authorized representative who has indicated his/her understanding and acceptance.     Plan Discussed with:   Anesthesia Plan Comments:         Anesthesia Quick Evaluation

## 2017-03-04 NOTE — Transfer of Care (Signed)
Immediate Anesthesia Transfer of Care Note  Patient: Lindsay Lawrence  Procedure(s) Performed: Procedure(s) with comments: COLONOSCOPY WITH PROPOFOL (N/A) - 10:10  Patient Location: PACU  Anesthesia Type:MAC  Level of Consciousness: drowsy and patient cooperative  Airway & Oxygen Therapy: Patient Spontanous Breathing and Patient connected to face mask oxygen  Post-op Assessment: Report given to RN and Post -op Vital signs reviewed and stable  Post vital signs: Reviewed and stable  Last Vitals:  Vitals:   03/04/17 1020 03/04/17 1025  BP: 110/73   Pulse:    Resp: (!) 30 (!) 24  Temp:      Last Pain:  Vitals:   03/04/17 0900  PainSc: 6       Patients Stated Pain Goal: 7 (46/50/35 [id number])  Complications: No apparent anesthesia complications

## 2017-03-07 ENCOUNTER — Ambulatory Visit: Payer: BLUE CROSS/BLUE SHIELD | Admitting: Cardiovascular Disease

## 2017-03-08 ENCOUNTER — Encounter (HOSPITAL_COMMUNITY): Payer: Self-pay | Admitting: Internal Medicine

## 2017-04-07 ENCOUNTER — Encounter (INDEPENDENT_AMBULATORY_CARE_PROVIDER_SITE_OTHER): Payer: Self-pay | Admitting: Internal Medicine

## 2017-04-07 ENCOUNTER — Ambulatory Visit (INDEPENDENT_AMBULATORY_CARE_PROVIDER_SITE_OTHER): Payer: BLUE CROSS/BLUE SHIELD | Admitting: Internal Medicine

## 2017-04-07 VITALS — BP 108/70 | HR 66 | Temp 98.0°F | Resp 18 | Ht 64.0 in | Wt 107.0 lb

## 2017-04-07 DIAGNOSIS — G8929 Other chronic pain: Secondary | ICD-10-CM | POA: Diagnosis not present

## 2017-04-07 DIAGNOSIS — R1310 Dysphagia, unspecified: Secondary | ICD-10-CM | POA: Diagnosis not present

## 2017-04-07 DIAGNOSIS — K219 Gastro-esophageal reflux disease without esophagitis: Secondary | ICD-10-CM | POA: Diagnosis not present

## 2017-04-07 DIAGNOSIS — R1013 Epigastric pain: Secondary | ICD-10-CM

## 2017-04-07 DIAGNOSIS — R1319 Other dysphagia: Secondary | ICD-10-CM

## 2017-04-07 MED ORDER — SUCRALFATE 1 G PO TABS: 2.0000 g | ORAL_TABLET | Freq: Three times a day (TID) | ORAL | 5 refills | Status: DC

## 2017-04-07 MED ORDER — PANTOPRAZOLE SODIUM 40 MG PO TBEC: 40.0000 mg | DELAYED_RELEASE_TABLET | Freq: Every day | ORAL | 5 refills | Status: DC

## 2017-04-07 NOTE — Patient Instructions (Signed)
Esophagogastroduodenoscopy and esophageal dilation to be scheduled. 

## 2017-04-07 NOTE — Progress Notes (Signed)
Lindsay Lawrence, Lindsay Star U, MD  Gastroenterology    [] Hide copied text [] Hover for attribution information Presenting complaint;  Worsening epigastric pain heartburn and dysphagia.  Subjective:  Patient is 61 year old Caucasian female who has chronic GERD history of gastric ulcer status post surgery in November 2014. She had partial gastrectomy and truncal vagotomy. She has remained with multiple symptoms. She called last week and requested to be seen. She complains of recurrent retrosternal burning coughing spells while eating and dysphagia with solids. She also complains of epigastric pain which has become more intense in the last 2-3 weeks. Pain is made worse with meals. She has had nausea and regurgitation but no vomiting. She remains with diarrhea. She has anywhere from 2-4 stools per day. She states all of her stools are loose. She denies frank rectal bleeding or melena. She had one episode of hematochezia that she saw small amount of blood on tissue with bowel movement. He also had single episode of profuse diaphoresis when she had pain and nausea. She has gained 7 pounds since her last visit. She believes she weighed more and may have lost few pounds. She does not take NSAIDs. She decided to go back to pantoprazole about 2 weeks ago but cannot tell any difference. She is taking sucralfate 2 g and bedtime. Last EGD was in November 2017 revealing normal-appearing esophagus Billroth I gastroduodenostomy without ulceration or stenosis. She had gastritis involving gastric remnant. Biopsy revealed reactive changes but no evidence of H. pylori.    Current Medications:     Outpatient Encounter Prescriptions as of 04/07/2017  Medication Sig  . acetaminophen (TYLENOL) 500 MG tablet Take 1,000 mg by mouth every 6 (six) hours as needed for headache.  . albuterol (PROVENTIL HFA;VENTOLIN HFA) 108 (90 BASE) MCG/ACT inhaler Inhale 2 puffs into the lungs every 6 (six) hours as needed for wheezing or  shortness of breath.   . ALPRAZolam (XANAX) 0.25 MG tablet Take 1 tablet (0.25 mg total) by mouth 3 (three) times daily as needed.  . Azelastine HCl 0.15 % SOLN Place 1 spray into the nose daily as needed (sinus congestion).   . Cholecalciferol (VITAMIN D3) 5000 UNITS CAPS Take 5,000 Units by mouth daily.   . cholestyramine (QUESTRAN) 4 G packet Take 1 packet (4 g total) by mouth 2 (two) times daily. Take 2 hours before or after taking other medicines. (Patient taking differently: Take 4 g by mouth 2 (two) times daily as needed. Takes if LOMOTIL does not work)  . dicyclomine (BENTYL) 10 MG capsule Take 1 capsule (10 mg total) by mouth 2 (two) times daily as needed for spasms.  . diphenoxylate-atropine (LOMOTIL) 2.5-0.025 MG tablet Take 1 tablet by mouth 4 (four) times daily as needed for diarrhea or loose stools.  . ferrous gluconate (FERGON) 324 MG tablet Take 324 mg by mouth as directed. Takes 2 times a week  . metoprolol tartrate (LOPRESSOR) 25 MG tablet Take 0.5 tablets (12.5 mg total) by mouth 2 (two) times daily.  . Multiple Vitamin (MULTIVITAMIN WITH MINERALS) TABS tablet Take 1 tablet by mouth daily.  . ondansetron (ZOFRAN) 4 MG tablet Take 1 tablet (4 mg total) by mouth every 12 (twelve) hours as needed for nausea.  . pantoprazole (PROTONIX) 40 MG tablet Take 1 tablet (40 mg total) by mouth daily before breakfast. (Patient taking differently: Take 40 mg by mouth daily as needed. )  . PROCTO-MED HC 2.5 % rectal cream Apply 1 application topically daily as needed.   . promethazine (PHENADOZ)  25 MG suppository Place 1 suppository (25 mg total) rectally 2 (two) times daily as needed for nausea or vomiting.  . ranitidine (ZANTAC) 150 MG tablet Take 1 tablet (150 mg total) by mouth 2 (two) times daily. (Patient taking differently: Take 150 mg by mouth daily as needed for heartburn. )  . sucralfate (CARAFATE) 1 g tablet Take 2 tablets (2 g total) by mouth at bedtime.  . traMADol (ULTRAM) 50 MG  tablet TAKE ONE TABLET BY MOUTH TWICE DAILY AS NEEDED  . [DISCONTINUED] mometasone (ASMANEX 120 METERED DOSES) 220 MCG/INH inhaler Inhale 2 puffs into the lungs daily as needed (shortness of breath).    No facility-administered encounter medications on file as of 04/07/2017.      Objective: Blood pressure 108/70, pulse 66, temperature 98 F (36.7 C), temperature source Oral, resp. rate 18, height 5\' 4"  (1.626 m), weight 107 lb (48.5 kg). Patient is alert and in no acute distress. Conjunctiva is pink. Sclera is nonicteric Oropharyngeal mucosa is normal. No neck masses or thyromegaly noted. Cardiac exam with regular rhythm normal S1 and S2. No murmur or gallop noted. Lungs are clear to auscultation. Abdomen is flat. Bowel sounds are normal. She has upper midline scar. On palpation she has mild generalized tenderness but she has moderate midepigastric tenderness with some guarding. No organomegaly or masses. No LE edema or clubbing noted.  Labs/studies Results: Lab data from 02/28/2017   WBC 8.9, H&H 14.3 and 44.1 and platelet count 312K.  Assessment:  #1. GERD symptoms unresponsive to therapy. Suspect she has alkaline reflux since she has had truncal vagotomy. I'm concerned that she may have gastroparesis. #2. Solid food dysphagia. She has history of Schatzki's ring but none was found on the last exam. She is having coughing spells while eating. She is to be aspirating. She would benefit from EGD and possible dilation. #3. Acute and chronic epigastric pain. Recurrent peptic ulcer disease needs to be ruled out. Pain could be due to due to no gastric bile reflux.   Plan:  Medication list updated. Continue pantoprazole at 40 mg by mouth every morning. Change sucralfate 1 g by mouth before meals and daily at bedtime. EGD with ED in near future.

## 2017-04-07 NOTE — Progress Notes (Signed)
Presenting complaint;  Worsening epigastric pain heartburn and dysphagia.  Subjective:  Patient is 61 year old Caucasian female who has chronic GERD history of gastric ulcer status post surgery in November 2014. She had partial gastrectomy and truncal vagotomy. She has remained with multiple symptoms. She called last week and requested to be seen. She complains of recurrent retrosternal burning coughing spells while eating and dysphagia with solids. She also complains of epigastric pain which has become more intense in the last 2-3 weeks. Pain is made worse with meals. She has had nausea and regurgitation but no vomiting. She remains with diarrhea. She has anywhere from 2-4 stools per day. She states all of her stools are loose. She denies frank rectal bleeding or melena. She had one episode of hematochezia that she saw small amount of blood on tissue with bowel movement. He also had single episode of profuse diaphoresis when she had pain and nausea. She has gained 7 pounds since her last visit. She believes she weighed more and may have lost few pounds. She does not take NSAIDs. She decided to go back to pantoprazole about 2 weeks ago but cannot tell any difference. She is taking sucralfate 2 g and bedtime. Last EGD was in November 2017 revealing normal-appearing esophagus Billroth I gastroduodenostomy without ulceration or stenosis. She had gastritis involving gastric remnant. Biopsy revealed reactive changes but no evidence of H. pylori.    Current Medications: Outpatient Encounter Prescriptions as of 04/07/2017  Medication Sig  . acetaminophen (TYLENOL) 500 MG tablet Take 1,000 mg by mouth every 6 (six) hours as needed for headache.  . albuterol (PROVENTIL HFA;VENTOLIN HFA) 108 (90 BASE) MCG/ACT inhaler Inhale 2 puffs into the lungs every 6 (six) hours as needed for wheezing or shortness of breath.   . ALPRAZolam (XANAX) 0.25 MG tablet Take 1 tablet (0.25 mg total) by mouth 3 (three) times daily  as needed.  . Azelastine HCl 0.15 % SOLN Place 1 spray into the nose daily as needed (sinus congestion).   . Cholecalciferol (VITAMIN D3) 5000 UNITS CAPS Take 5,000 Units by mouth daily.   . cholestyramine (QUESTRAN) 4 G packet Take 1 packet (4 g total) by mouth 2 (two) times daily. Take 2 hours before or after taking other medicines. (Patient taking differently: Take 4 g by mouth 2 (two) times daily as needed. Takes if LOMOTIL does not work)  . dicyclomine (BENTYL) 10 MG capsule Take 1 capsule (10 mg total) by mouth 2 (two) times daily as needed for spasms.  . diphenoxylate-atropine (LOMOTIL) 2.5-0.025 MG tablet Take 1 tablet by mouth 4 (four) times daily as needed for diarrhea or loose stools.  . ferrous gluconate (FERGON) 324 MG tablet Take 324 mg by mouth as directed. Takes 2 times a week  . metoprolol tartrate (LOPRESSOR) 25 MG tablet Take 0.5 tablets (12.5 mg total) by mouth 2 (two) times daily.  . Multiple Vitamin (MULTIVITAMIN WITH MINERALS) TABS tablet Take 1 tablet by mouth daily.  . ondansetron (ZOFRAN) 4 MG tablet Take 1 tablet (4 mg total) by mouth every 12 (twelve) hours as needed for nausea.  . pantoprazole (PROTONIX) 40 MG tablet Take 1 tablet (40 mg total) by mouth daily before breakfast. (Patient taking differently: Take 40 mg by mouth daily as needed. )  . PROCTO-MED HC 2.5 % rectal cream Apply 1 application topically daily as needed.   . promethazine (PHENADOZ) 25 MG suppository Place 1 suppository (25 mg total) rectally 2 (two) times daily as needed for nausea or vomiting.  .Marland Kitchen  ranitidine (ZANTAC) 150 MG tablet Take 1 tablet (150 mg total) by mouth 2 (two) times daily. (Patient taking differently: Take 150 mg by mouth daily as needed for heartburn. )  . sucralfate (CARAFATE) 1 g tablet Take 2 tablets (2 g total) by mouth at bedtime.  . traMADol (ULTRAM) 50 MG tablet TAKE ONE TABLET BY MOUTH TWICE DAILY AS NEEDED  . [DISCONTINUED] mometasone (ASMANEX 120 METERED DOSES) 220 MCG/INH  inhaler Inhale 2 puffs into the lungs daily as needed (shortness of breath).    No facility-administered encounter medications on file as of 04/07/2017.      Objective: Blood pressure 108/70, pulse 66, temperature 98 F (36.7 C), temperature source Oral, resp. rate 18, height 5\' 4"  (1.626 m), weight 107 lb (48.5 kg). Patient is alert and in no acute distress. Conjunctiva is pink. Sclera is nonicteric Oropharyngeal mucosa is normal. No neck masses or thyromegaly noted. Cardiac exam with regular rhythm normal S1 and S2. No murmur or gallop noted. Lungs are clear to auscultation. Abdomen is flat. Bowel sounds are normal. She has upper midline scar. On palpation she has mild generalized tenderness but she has moderate midepigastric tenderness with some guarding. No organomegaly or masses. No LE edema or clubbing noted.  Labs/studies Results: Lab data from 02/28/2017   WBC 8.9, H&H 14.3 and 44.1 and platelet count 312K.  Assessment:  #1. GERD symptoms unresponsive to therapy. Suspect she has alkaline reflux since she has had truncal vagotomy. I'm concerned that she may have gastroparesis. #2. Solid food dysphagia. She has history of Schatzki's ring but none was found on the last exam. She is having coughing spells while eating. She is to be aspirating. She would benefit from EGD and possible dilation. #3. Acute and chronic epigastric pain. Recurrent peptic ulcer disease needs to be ruled out. Pain could be due to due to no gastric bile reflux.   Plan:  Medication list updated. Continue pantoprazole at 40 mg by mouth every morning. Change sucralfate 1 g by mouth before meals and daily at bedtime. EGD with ED in near future.

## 2017-04-08 ENCOUNTER — Other Ambulatory Visit (INDEPENDENT_AMBULATORY_CARE_PROVIDER_SITE_OTHER): Payer: Self-pay | Admitting: *Deleted

## 2017-04-08 DIAGNOSIS — R1013 Epigastric pain: Principal | ICD-10-CM

## 2017-04-08 DIAGNOSIS — R1319 Other dysphagia: Secondary | ICD-10-CM

## 2017-04-08 DIAGNOSIS — K219 Gastro-esophageal reflux disease without esophagitis: Secondary | ICD-10-CM | POA: Insufficient documentation

## 2017-04-08 DIAGNOSIS — R1310 Dysphagia, unspecified: Secondary | ICD-10-CM

## 2017-04-08 DIAGNOSIS — G8929 Other chronic pain: Secondary | ICD-10-CM

## 2017-04-13 ENCOUNTER — Encounter (HOSPITAL_COMMUNITY)
Admission: RE | Disposition: A | Discharge status: Home / Self Care | Payer: Self-pay | Source: Ambulatory Visit | Attending: Internal Medicine

## 2017-04-13 ENCOUNTER — Encounter (HOSPITAL_COMMUNITY): Payer: Self-pay

## 2017-04-13 ENCOUNTER — Ambulatory Visit (HOSPITAL_COMMUNITY)
Admission: RE | Admit: 2017-04-13 | Discharge: 2017-04-13 | Disposition: A | Discharge status: Home / Self Care | Payer: BLUE CROSS/BLUE SHIELD | Source: Ambulatory Visit | Attending: Internal Medicine | Admitting: Internal Medicine

## 2017-04-13 ENCOUNTER — Encounter (HOSPITAL_COMMUNITY): Payer: Self-pay | Admitting: Anesthesiology

## 2017-04-13 DIAGNOSIS — F419 Anxiety disorder, unspecified: Secondary | ICD-10-CM | POA: Insufficient documentation

## 2017-04-13 DIAGNOSIS — K3189 Other diseases of stomach and duodenum: Secondary | ICD-10-CM | POA: Diagnosis not present

## 2017-04-13 DIAGNOSIS — Z79899 Other long term (current) drug therapy: Secondary | ICD-10-CM | POA: Insufficient documentation

## 2017-04-13 DIAGNOSIS — K580 Irritable bowel syndrome with diarrhea: Secondary | ICD-10-CM | POA: Diagnosis not present

## 2017-04-13 DIAGNOSIS — Z8719 Personal history of other diseases of the digestive system: Secondary | ICD-10-CM | POA: Insufficient documentation

## 2017-04-13 DIAGNOSIS — G8929 Other chronic pain: Secondary | ICD-10-CM

## 2017-04-13 DIAGNOSIS — Z903 Acquired absence of stomach [part of]: Secondary | ICD-10-CM | POA: Diagnosis not present

## 2017-04-13 DIAGNOSIS — K219 Gastro-esophageal reflux disease without esophagitis: Secondary | ICD-10-CM | POA: Insufficient documentation

## 2017-04-13 DIAGNOSIS — Z980 Intestinal bypass and anastomosis status: Secondary | ICD-10-CM | POA: Insufficient documentation

## 2017-04-13 DIAGNOSIS — R1314 Dysphagia, pharyngoesophageal phase: Secondary | ICD-10-CM | POA: Insufficient documentation

## 2017-04-13 DIAGNOSIS — I340 Nonrheumatic mitral (valve) insufficiency: Secondary | ICD-10-CM | POA: Insufficient documentation

## 2017-04-13 DIAGNOSIS — R1319 Other dysphagia: Secondary | ICD-10-CM | POA: Insufficient documentation

## 2017-04-13 DIAGNOSIS — R1013 Epigastric pain: Secondary | ICD-10-CM | POA: Insufficient documentation

## 2017-04-13 DIAGNOSIS — R1010 Upper abdominal pain, unspecified: Secondary | ICD-10-CM | POA: Insufficient documentation

## 2017-04-13 DIAGNOSIS — R1310 Dysphagia, unspecified: Secondary | ICD-10-CM | POA: Insufficient documentation

## 2017-04-13 HISTORY — PX: ESOPHAGOGASTRODUODENOSCOPY: SHX5428

## 2017-04-13 HISTORY — PX: ESOPHAGEAL DILATION: SHX303

## 2017-04-13 SURGERY — EGD (ESOPHAGOGASTRODUODENOSCOPY)
Anesthesia: Moderate Sedation

## 2017-04-13 MED ORDER — MEPERIDINE HCL 50 MG/ML IJ SOLN
INTRAMUSCULAR | Status: DC | PRN
  Administered 2017-04-13: 25 mg via INTRAVENOUS

## 2017-04-13 MED ORDER — LIDOCAINE VISCOUS 2 % MT SOLN
OROMUCOSAL | Status: AC
  Filled 2017-04-13: qty 15

## 2017-04-13 MED ORDER — MIDAZOLAM HCL 5 MG/5ML IJ SOLN
INTRAMUSCULAR | Status: AC
  Filled 2017-04-13: qty 10

## 2017-04-13 MED ORDER — MIDAZOLAM HCL 5 MG/5ML IJ SOLN
INTRAMUSCULAR | Status: DC | PRN
  Administered 2017-04-13: 1 mg via INTRAVENOUS
  Administered 2017-04-13: 2 mg via INTRAVENOUS

## 2017-04-13 MED ORDER — SIMETHICONE 40 MG/0.6ML PO SUSP
ORAL | Status: DC | PRN
  Administered 2017-04-13: 11:00:00

## 2017-04-13 MED ORDER — MEPERIDINE HCL 50 MG/ML IJ SOLN
INTRAMUSCULAR | Status: AC
  Filled 2017-04-13: qty 1

## 2017-04-13 MED ORDER — SODIUM CHLORIDE 0.9 % IV SOLN
INTRAVENOUS | Status: DC
  Administered 2017-04-13: 11:00:00 via INTRAVENOUS

## 2017-04-13 NOTE — Op Note (Signed)
[facility] Patient Name: Lindsay Lawrence Procedure Date: 04/13/2017 10:49 AM MRN: [id number] Date of Birth: Jul 13, 1956 Attending MD: Lionel December , MD CSN: [id number] Age: 61 Admit Type: Outpatient Procedure:                Upper GI endoscopy Indications:              Epigastric abdominal pain, Esophageal dysphagia Providers:                Lionel December, MD, Toniann Fail RN, RN, Judee Clara, RN Referring MD:             Ignatius Specking, MD Medicines:                Lidocaine spray, Meperidine 25 mg IV, Midazolam 3                            mg IV Complications:            No immediate complications. Estimated Blood Loss:     Estimated blood loss was minimal from left                            pharyngeal wall. Procedure:                Pre-Anesthesia Assessment:                           - Prior to the procedure, a History and Physical                            was performed, and patient medications and                            allergies were reviewed. The patient's tolerance of                            previous anesthesia was also reviewed. The risks                            and benefits of the procedure and the sedation                            options and risks were discussed with the patient.                            All questions were answered, and informed consent                            was obtained. Prior Anticoagulants: The patient has                            taken no previous anticoagulant or antiplatelet  agents. ASA Grade Assessment: II - A patient with                            mild systemic disease. After reviewing the risks                            and benefits, the patient was deemed in                            satisfactory condition to undergo the procedure.                           After obtaining informed consent, the endoscope was                            passed under direct  vision. Throughout the                            procedure, the patient's blood pressure, pulse, and                            oxygen saturations were monitored continuously. The                            [id number] ([id number]) scope was introduced through the                            mouth, and advanced to the second part of duodenum.                            The upper GI endoscopy was accomplished without                            difficulty. The patient tolerated the procedure                            well. Scope In: 11:16:00 AM Scope Out: 11:28:51 AM Total Procedure Duration: 0 hours 12 minutes 51 seconds  Findings:      The examined esophagus was normal.      The Z-line was regular and was found 39 cm from the incisors.      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 A gastric       pouch with a large size was found containing bile. The gastroduodenal       anastomosis was characterized by healthy appearing mucosa. This was       traversed.      Localized mildly erythematous mucosa was found in the gastric body.      The second portion of the duodenum was normal.      No endoscopic abnormality was evident in the esophagus to explain the       patient's complaint of dysphagia. It was decided, however, to proceed       with dilation of the entire esophagus. The scope was withdrawn. Dilation       was performed with a Maloney dilator with  no resistance at 54 Fr. The       dilation site was examined following endoscope reinsertion and showed no       change and no bleeding, mucosal tear or perforation. Impression:               - Normal esophagus.                           - Z-line regular, 39 cm from the incisors.                           - Patent Billroth I gastroduodenostomy was found,                            characterized by healthy appearing mucosa.                           - Erythematous mucosa in the gastric body.                           - Normal  second portion of the duodenum.                           - No endoscopic esophageal abnormality to explain                            patient's dysphagia. Esophagus dilated. Dilated.                           - No specimens collected. Moderate Sedation:      Moderate (conscious) sedation was administered by the endoscopy nurse       and supervised by the endoscopist. The following parameters were       monitored: oxygen saturation, heart rate, blood pressure, CO2       capnography and response to care. Total physician intraservice time was       19 minutes. Recommendation:           - Patient has a contact number available for                            emergencies. The signs and symptoms of potential                            delayed complications were discussed with the                            patient. Return to normal activities tomorrow.                            Written discharge instructions were provided to the                            patient.                           - Resume previous diet today.                           -  Continue present medications.                           - Telephone GI clinic in 1 week. Procedure Code(s):        --- Professional ---                           [phone], Esophagogastroduodenoscopy, flexible,                            transoral; diagnostic, including collection of                            specimen(s) by brushing or washing, when performed                            (separate procedure)                           43450, Dilation of esophagus, by unguided sound or                            bougie, single or multiple passes                           99152, Moderate sedation services provided by the                            same physician or other qualified health care                            professional performing the diagnostic or                            therapeutic service that the sedation supports,                             requiring the presence of an independent trained                            observer to assist in the monitoring of the                            patient's level of consciousness and physiological                            status; initial 15 minutes of intraservice time,                            patient age 109 years or older Diagnosis Code(s):        --- Professional ---                           Z98.0, Intestinal bypass and anastomosis status  K31.89, Other diseases of stomach and duodenum                           R10.13, Epigastric pain                           R13.14, Dysphagia, pharyngoesophageal phase CPT copyright 2016 [employer]. All rights reserved. The codes documented in this report are preliminary and upon coder review may  be revised to meet current compliance requirements. Lionel December, MD Lionel December, MD 04/13/2017 11:38:11 AM This report has been signed electronically. Number of Addenda: 0

## 2017-04-13 NOTE — H&P (Signed)
Lindsay Lawrence is an 61 y.o. female.   Chief Complaint: Patient is here for EGD and ED.  HPI: Patient is 61 year old Caucasian female who has chronic GERD history of gastric ulcer status post surgery in November 2014. She had partial gastrectomy and truncal vagotomy. She has remained with multiple symptoms. Patient was seen in the office last week for recurrent retrosternal burning coughing spells while eating and dysphagia with solids. She also complains of epigastric pain which has become more intense in the last 2-3 weeks. Pain is made worse with meals. She has had nausea and regurgitation but no vomiting. She remains with diarrhea. She has anywhere from 2-4 stools per day. She states all of her stools are loose. She denies frank rectal bleeding or melena. She had one episode of hematochezia that she saw small amount of blood on tissue with bowel movement. He also had single episode of profuse diaphoresis when she had pain and nausea. She has gained 7 pounds since her last visit. She believes she weighed more and may have lost few pounds. She does not take NSAIDs. She decided to go back to pantoprazole about 3 weeks ago but cannot tell any difference. She is taking sucralfate 2 g and bedtime. Last EGD was in November 2017 revealing normal-appearing esophagus Billroth I gastroduodenostomy without ulceration or stenosis. She had gastritis involving gastric remnant. Biopsy revealed reactive changes but no evidence of H. Pylori.   Past Medical History:  Diagnosis Date  . Anemia   . Anxiety   . Chest pain    Nuclear, normal, 2010,  //   hospital August, 2013 no evidence of injury  . Ejection fraction    EF 65%, echo, April 19, 2012  . Gastric ulcer   . GERD (gastroesophageal reflux disease)   . History of palpitations   . Irritable bowel syndrome   . Mitral regurgitation    Mild, echo, August, 2013  . Palpitations   . Reactive airway disease    Pulmonary function studies from June, 2013, show  the patient does have significant response to bronchodilators.  . Syncope    Vasovagal    Past Surgical History:  Procedure Laterality Date  . ABDOMINAL HYSTERECTOMY    . BIOPSY  08/04/2016   Procedure: BIOPSY;  Surgeon: Malissa Hippo, MD;  [facility];  Service: Endoscopy;;  gastric  . CHOLECYSTECTOMY    . COLONOSCOPY    . COLONOSCOPY WITH PROPOFOL N/A 03/04/2017   Procedure: COLONOSCOPY WITH PROPOFOL;  Surgeon: Malissa Hippo, MD;  [facility]: [facility];  Service: Endoscopy;  Laterality: N/A;  10:10  . ESOPHAGEAL DILATION  06/07/2014   Procedure: ESOPHAGEAL DILATION;  Surgeon: Malissa Hippo, MD;  [facility]: [facility];  Service: Endoscopy;;  . ESOPHAGEAL DILATION N/A 08/27/2015   Procedure: ESOPHAGEAL DILATION;  Surgeon: Malissa Hippo, MD;  [facility];  Service: Endoscopy;  Laterality: N/A;  . ESOPHAGEAL DILATION  08/04/2016   Procedure: ESOPHAGEAL DILATION;  Surgeon: Malissa Hippo, MD;  [facility];  Service: Endoscopy;;  . ESOPHAGOGASTRODUODENOSCOPY  02/04/2012   Procedure: ESOPHAGOGASTRODUODENOSCOPY (EGD);  Surgeon: Malissa Hippo, MD;  [facility];  Service: Endoscopy;  Laterality: N/A;  320  . ESOPHAGOGASTRODUODENOSCOPY  06/02/2012   Procedure: ESOPHAGOGASTRODUODENOSCOPY (EGD);  Surgeon: Malissa Hippo, MD;  [facility];  Service: Endoscopy;  Laterality: N/A;  1050  . ESOPHAGOGASTRODUODENOSCOPY N/A 06/07/2014   Procedure: ESOPHAGOGASTRODUODENOSCOPY (EGD);  Surgeon: Malissa Hippo, MD;  [facility];  Service: Endoscopy;  Laterality: N/A;  1035-rescheduled 9/25 @ 8:30 Ann to notify pt  . ESOPHAGOGASTRODUODENOSCOPY N/A 02/17/2015   Procedure: ESOPHAGOGASTRODUODENOSCOPY (EGD);  Surgeon: Malissa HippoNajeeb U Jenner Rosier, MD;  [facility];  Service: Endoscopy;  Laterality: N/A;  730  . ESOPHAGOGASTRODUODENOSCOPY N/A 08/27/2015   Procedure: ESOPHAGOGASTRODUODENOSCOPY (EGD);  Surgeon: Malissa HippoNajeeb U Kindell Strada, MD;   [facility];  Service: Endoscopy;  Laterality: N/A;  1155  . ESOPHAGOGASTRODUODENOSCOPY N/A 08/04/2016   Procedure: ESOPHAGOGASTRODUODENOSCOPY (EGD);  Surgeon: Malissa HippoNajeeb U Paras Kreider, MD;  [facility];  Service: Endoscopy;  Laterality: N/A;  225  . ESOPHAGOGASTRODUODENOSCOPY (EGD) WITH ESOPHAGEAL DILATION N/A 05/31/2013   Procedure: ESOPHAGOGASTRODUODENOSCOPY (EGD) WITH ESOPHAGEAL DILATION;  Surgeon: Malissa HippoNajeeb U Yarrow Linhart, MD;  [facility];  Service: Endoscopy;  Laterality: N/A;  . FLEXIBLE SIGMOIDOSCOPY N/A 05/31/2013   Procedure: FLEXIBLE SIGMOIDOSCOPY;  Surgeon: Malissa HippoNajeeb U Lashona Schaaf, MD;  [facility];  Service: Endoscopy;  Laterality: N/A;  250  . FLEXIBLE SIGMOIDOSCOPY N/A 06/07/2014   Procedure: FLEXIBLE SIGMOIDOSCOPY;  Surgeon: Malissa HippoNajeeb U Darnisha Vernet, MD;  [facility];  Service: Endoscopy;  Laterality: N/A;  . GIVENS CAPSULE STUDY  05/03/2011   Procedure: GIVENS CAPSULE STUDY;  Surgeon: Malissa HippoNajeeb U Rashon Westrup, MD;  [facility]: [facility];  Service: Endoscopy;  Laterality: N/A;  7:30 am  . STOMACH SURGERY    . UPPER GASTROINTESTINAL ENDOSCOPY      Family History  Problem Relation Age of Onset  . Healthy Mother   . Heart attack Father   . Healthy Sister   . Colon cancer Neg Hx    Social History:  reports that she has never smoked. She has never used smokeless tobacco. She reports that she does not drink alcohol or use drugs.  Allergies:  Allergies  Allergen Reactions  . Aspirin Other (See Comments)    Ulcer, Reflux  . Codeine Nausea And Vomiting    Speeds heart up, dizziness  . Fentanyl Nausea And Vomiting    Confusion, weakness  . Nsaids Other (See Comments)    reflux, ulcer  . Propoxyphene N-Acetaminophen Nausea And Vomiting    Speeds heart up, dizziness    Medications Prior to Admission  Medication Sig Dispense Refill  . acetaminophen (TYLENOL) 500 MG tablet Take 1,000 mg by mouth every 6 (six) hours as needed for headache.    . albuterol  (PROVENTIL HFA;VENTOLIN HFA) 108 (90 BASE) MCG/ACT inhaler Inhale 2 puffs into the lungs every 6 (six) hours as needed for wheezing or shortness of breath.     . ALPRAZolam (XANAX) 0.25 MG tablet Take 1 tablet (0.25 mg total) by mouth 3 (three) times daily as needed. (Patient taking differently: Take 0.25 mg by mouth 3 (three) times daily as needed for anxiety. ) 90 tablet 2  . Azelastine HCl 0.15 % SOLN Place 1 spray into the nose daily as needed (sinus congestion).     . Cholecalciferol (VITAMIN D3) 5000 UNITS CAPS Take 5,000 Units by mouth daily.     . cholestyramine (QUESTRAN) 4 G packet Take 1 packet (4 g total) by mouth 2 (two) times daily. Take 2 hours before or after taking other medicines. (Patient taking differently: Take 4 g by mouth 2 (two) times daily as needed. Takes if LOMOTIL does not work) 60 each 2  . dicyclomine (BENTYL) 10 MG capsule Take 1 capsule (10 mg total) by mouth 2 (two) times daily as needed for spasms. (Patient taking differently: Take 10 mg  by mouth 2 (two) times daily as needed (abdominal spasms/ibs). ) 60 capsule 5  . diphenoxylate-atropine (LOMOTIL) 2.5-0.025 MG tablet Take 1 tablet by mouth 4 (four) times daily as needed for diarrhea or loose stools. 120 tablet 2  . ferrous gluconate (FERGON) 324 MG tablet Take 324 mg by mouth 2 (two) times a week. Tuesday & Friday    . metoprolol tartrate (LOPRESSOR) 25 MG tablet Take 0.5 tablets (12.5 mg total) by mouth 2 (two) times daily. 30 tablet 11  . Multiple Vitamin (MULTIVITAMIN WITH MINERALS) TABS tablet Take 1 tablet by mouth daily.    . ondansetron (ZOFRAN) 4 MG tablet Take 1 tablet (4 mg total) by mouth every 12 (twelve) hours as needed for nausea. 20 tablet 1  . pantoprazole (PROTONIX) 40 MG tablet Take 1 tablet (40 mg total) by mouth daily before breakfast. 30 tablet 5  . PROCTO-MED HC 2.5 % rectal cream Apply 1 application topically daily as needed for hemorrhoids.     . promethazine (PHENADOZ) 25 MG suppository Place  1 suppository (25 mg total) rectally 2 (two) times daily as needed for nausea or vomiting. 12 each 1  . sucralfate (CARAFATE) 1 g tablet Take 2 tablets (2 g total) by mouth 4 (four) times daily -  with meals and at bedtime. 120 tablet 5  . traMADol (ULTRAM) 50 MG tablet TAKE ONE TABLET BY MOUTH TWICE DAILY AS NEEDED 60 tablet 0    No results found for this or any previous visit (from the past 48 hour(s)). No results found.  ROS  Blood pressure 104/73, pulse (!) 58, temperature 98 F (36.7 C), temperature source Oral, resp. rate 12, SpO2 100 %. Physical Exam  Constitutional:  Well-developed thin Caucasian female in NAD.  HENT:  Mouth/Throat: Oropharynx is clear and moist.  Eyes: Conjunctivae are normal. No scleral icterus.  Neck: No thyromegaly present.  Cardiovascular: Normal rate, regular rhythm and normal heart sounds.   No murmur heard. Respiratory: Effort normal and breath sounds normal.  GI:  Abdomen is flat and soft with mild generalized tenderness which is more pronounced in epigastric region. No organomegaly or masses.  Musculoskeletal: She exhibits no edema.  Neurological: She is alert.  Skin: Skin is warm and dry.     Assessment/Plan Dysphagia and nausea. Poorly controlled GERD symptoms. Epigastric pain. EGD with ED.  Lionel DecemberNajeeb Oriya Kettering, MD 04/13/2017, 11:04 AM

## 2017-04-13 NOTE — Discharge Instructions (Signed)
Resume usual medications and diet. No driving for 24 hours. Please call office with progress report in one week. If swallowing difficulty persists will proceed with further testing.    Esophagogastroduodenoscopy, Care After Refer to this sheet in the next few weeks. These instructions provide you with information about caring for yourself after your procedure. Your health care provider may also give you more specific instructions. Your treatment has been planned according to current medical practices, but problems sometimes occur. Call your health care provider if you have any problems or questions after your procedure. What can I expect after the procedure? After the procedure, it is common to have:  A sore throat.  Nausea.  Bloating.  Dizziness.  Fatigue.  Follow these instructions at home:  Do not eat or drink anything until the numbing medicine (local anesthetic) has worn off and your gag reflex has returned. You will know that the local anesthetic has worn off when you can swallow comfortably.  Do not drive for 24 hours if you received a medicine to help you relax (sedative).  If your health care provider took a tissue sample for testing during the procedure, make sure to get your test results. This is your responsibility. Ask your health care provider or the department performing the test when your results will be ready.  Keep all follow-up visits as told by your health care provider. This is important. Contact a health care provider if:  You cannot stop coughing.  You are not urinating.  You are urinating less than usual. Get help right away if:  You have trouble swallowing.  You cannot eat or drink.  You have throat or chest pain that gets worse.  You are dizzy or light-headed.  You faint.  You have nausea or vomiting.  You have chills.  You have a fever.  You have severe abdominal pain.  You have black, tarry, or bloody stools. This information is  not intended to replace advice given to you by your health care provider. Make sure you discuss any questions you have with your health care provider. Document Released: 08/16/2012 Document Revised: 02/05/2016 Document Reviewed: 07/24/2015 Elsevier Interactive Patient Education  [employer]2018 [employer]

## 2017-04-15 ENCOUNTER — Encounter (HOSPITAL_COMMUNITY): Payer: Self-pay | Admitting: Internal Medicine

## 2017-04-18 ENCOUNTER — Telehealth: Payer: Self-pay | Admitting: Cardiovascular Disease

## 2017-04-18 NOTE — Telephone Encounter (Signed)
Patient walked in asking if Dr Purvis SheffieldKoneswaran would write RX for knee high support hose to [facility] at [phone]

## 2017-04-19 NOTE — Telephone Encounter (Signed)
Returned call.  Detailed message left on voice mail - will fax rx to [facility] as requested.

## 2017-04-20 ENCOUNTER — Other Ambulatory Visit (INDEPENDENT_AMBULATORY_CARE_PROVIDER_SITE_OTHER): Payer: Self-pay | Admitting: *Deleted

## 2017-04-20 ENCOUNTER — Telehealth (INDEPENDENT_AMBULATORY_CARE_PROVIDER_SITE_OTHER): Payer: Self-pay | Admitting: *Deleted

## 2017-04-20 NOTE — Telephone Encounter (Signed)
This was discussed with Dr.Rehman and he ask that we do Abdominal /Pelvic CT

## 2017-04-20 NOTE — Telephone Encounter (Signed)
Patient called the office and left the following message at 6 am this morning.  She states that she was to call with a progress report after she got her test results. She is not much better, nausea bad abdominal pain, swelling in abdomen , bloating and real weak. When she has these spells she cannot think clearly. If she needs to be seen she ask for a late afternoon appointment as she is trying to work. She also says that she does not want to go anywhere else because her husband cant see good to drive ,ect.   This will be addressed by Dr.Rehman.

## 2017-04-21 ENCOUNTER — Other Ambulatory Visit (INDEPENDENT_AMBULATORY_CARE_PROVIDER_SITE_OTHER): Payer: Self-pay | Admitting: *Deleted

## 2017-04-21 DIAGNOSIS — M6281 Muscle weakness (generalized): Secondary | ICD-10-CM

## 2017-04-21 DIAGNOSIS — R110 Nausea: Secondary | ICD-10-CM

## 2017-04-21 DIAGNOSIS — R140 Abdominal distension (gaseous): Secondary | ICD-10-CM

## 2017-04-21 DIAGNOSIS — R109 Unspecified abdominal pain: Secondary | ICD-10-CM

## 2017-04-21 NOTE — Progress Notes (Signed)
Insurance won't approve CT per Dr Karilyn Cotaehman proceed with US and if pain gets worse go to ED, US sch'd 04/25/17 at 830 (815), npo after midnight, left detailed message for patient

## 2017-04-21 NOTE — Telephone Encounter (Signed)
Discussed with Dr.Rehman. He ask that a Abd/Pelvic CT with contrast be preformed. This is being arranged, and a message for the patient was left on her home voice mail.

## 2017-04-24 ENCOUNTER — Other Ambulatory Visit (INDEPENDENT_AMBULATORY_CARE_PROVIDER_SITE_OTHER): Payer: Self-pay | Admitting: Internal Medicine

## 2017-04-24 DIAGNOSIS — F411 Generalized anxiety disorder: Secondary | ICD-10-CM

## 2017-04-25 ENCOUNTER — Other Ambulatory Visit (INDEPENDENT_AMBULATORY_CARE_PROVIDER_SITE_OTHER): Payer: Self-pay | Admitting: *Deleted

## 2017-04-25 ENCOUNTER — Ambulatory Visit (HOSPITAL_COMMUNITY)
Admission: RE | Admit: 2017-04-25 | Discharge: 2017-04-25 | Disposition: A | Discharge status: Home / Self Care | Payer: BLUE CROSS/BLUE SHIELD | Source: Ambulatory Visit | Attending: Internal Medicine | Admitting: Internal Medicine

## 2017-04-25 DIAGNOSIS — R109 Unspecified abdominal pain: Secondary | ICD-10-CM | POA: Diagnosis present

## 2017-04-25 DIAGNOSIS — Z9049 Acquired absence of other specified parts of digestive tract: Secondary | ICD-10-CM | POA: Diagnosis not present

## 2017-04-25 DIAGNOSIS — R932 Abnormal findings on diagnostic imaging of liver and biliary tract: Secondary | ICD-10-CM

## 2017-04-25 DIAGNOSIS — R110 Nausea: Secondary | ICD-10-CM | POA: Diagnosis not present

## 2017-04-25 DIAGNOSIS — M6281 Muscle weakness (generalized): Secondary | ICD-10-CM | POA: Diagnosis not present

## 2017-04-25 DIAGNOSIS — R935 Abnormal findings on diagnostic imaging of other abdominal regions, including retroperitoneum: Secondary | ICD-10-CM | POA: Insufficient documentation

## 2017-04-25 DIAGNOSIS — R140 Abdominal distension (gaseous): Secondary | ICD-10-CM | POA: Diagnosis not present

## 2017-04-26 ENCOUNTER — Other Ambulatory Visit (INDEPENDENT_AMBULATORY_CARE_PROVIDER_SITE_OTHER): Payer: Self-pay | Admitting: *Deleted

## 2017-04-26 NOTE — Progress Notes (Signed)
[facility] 05/02/17 at 915 (815),npo after midnight, left message for patient

## 2017-07-12 ENCOUNTER — Encounter (INDEPENDENT_AMBULATORY_CARE_PROVIDER_SITE_OTHER): Payer: Self-pay | Admitting: Internal Medicine

## 2017-07-12 ENCOUNTER — Ambulatory Visit (INDEPENDENT_AMBULATORY_CARE_PROVIDER_SITE_OTHER): Payer: BLUE CROSS/BLUE SHIELD | Admitting: Internal Medicine

## 2017-07-12 VITALS — BP 106/70 | HR 66 | Temp 98.0°F | Resp 18 | Ht 64.0 in | Wt 109.1 lb

## 2017-07-12 DIAGNOSIS — G8929 Other chronic pain: Secondary | ICD-10-CM

## 2017-07-12 DIAGNOSIS — K219 Gastro-esophageal reflux disease without esophagitis: Secondary | ICD-10-CM

## 2017-07-12 DIAGNOSIS — R110 Nausea: Secondary | ICD-10-CM

## 2017-07-12 DIAGNOSIS — E559 Vitamin D deficiency, unspecified: Secondary | ICD-10-CM

## 2017-07-12 DIAGNOSIS — Z862 Personal history of diseases of the blood and blood-forming organs and certain disorders involving the immune mechanism: Secondary | ICD-10-CM | POA: Diagnosis not present

## 2017-07-12 DIAGNOSIS — K580 Irritable bowel syndrome with diarrhea: Secondary | ICD-10-CM | POA: Diagnosis not present

## 2017-07-12 DIAGNOSIS — F411 Generalized anxiety disorder: Secondary | ICD-10-CM

## 2017-07-12 DIAGNOSIS — R1013 Epigastric pain: Secondary | ICD-10-CM | POA: Diagnosis not present

## 2017-07-12 MED ORDER — ONDANSETRON HCL 4 MG PO TABS: 4.0000 mg | ORAL_TABLET | Freq: Two times a day (BID) | ORAL | 1 refills | Status: DC | PRN

## 2017-07-12 MED ORDER — ALPRAZOLAM 0.25 MG PO TABS: 0.2500 mg | ORAL_TABLET | Freq: Three times a day (TID) | ORAL | 2 refills | Status: DC | PRN

## 2017-07-12 MED ORDER — PANTOPRAZOLE SODIUM 40 MG PO TBEC: 40.0000 mg | DELAYED_RELEASE_TABLET | Freq: Every day | ORAL | 5 refills | Status: DC

## 2017-07-12 NOTE — Patient Instructions (Signed)
Physician will call with results of blood test when completed. 

## 2017-07-12 NOTE — Progress Notes (Signed)
Presenting complaint;  Follow-up for multiple conditions.  Subjective:  Patient is 61 year old Caucasian female who is here for scheduled visit.  She says she feels about the same.  She remains with diarrhea.  She has anywhere from 1-3 bowel movements per day.  She has occasional hematochezia no more than once or twice a month.  She remains with 2-3 episodes of heartburn every week.  It seemed to occur with certain foods.  She has nausea once or twice a week and it is usually mild.  Every now and then its more intense and she takes ondansetron.  She has not taken a dose in the last 3 months.  She denies vomiting or dysphagia.  She takes dicyclomine occasionally.  She may have taken 2 doses in the last 3 months.  She takes Lomotil every day no more than twice a day.  She has not taken tramadol recently. Her appetite is fair.  She has gained 2 pounds since her last visit.  She needs 3 of her medications refills.    Current Medications: Outpatient Encounter Prescriptions as of 07/12/2017  Medication Sig  . acetaminophen (TYLENOL) 500 MG tablet Take 1,000 mg by mouth every 6 (six) hours as needed for headache.  . albuterol (PROVENTIL HFA;VENTOLIN HFA) 108 (90 BASE) MCG/ACT inhaler Inhale 2 puffs into the lungs every 6 (six) hours as needed for wheezing or shortness of breath.   . ALPRAZolam (XANAX) 0.25 MG tablet TAKE 1 TABLET BY MOUTH THREE TIMES DAILY AS NEEDED  . Azelastine HCl 0.15 % SOLN Place 1 spray into the nose daily as needed (sinus congestion).   . Cholecalciferol (VITAMIN D3) 5000 UNITS CAPS Take 5,000 Units by mouth daily.   . cholestyramine (QUESTRAN) 4 G packet Take 1 packet (4 g total) by mouth 2 (two) times daily. Take 2 hours before or after taking other medicines. (Patient taking differently: Take 4 g by mouth 2 (two) times daily as needed. Takes if LOMOTIL does not work)  . dicyclomine (BENTYL) 10 MG capsule Take 1 capsule (10 mg total) by mouth 2 (two) times daily as needed for  spasms. (Patient taking differently: Take 10 mg by mouth 2 (two) times daily as needed (abdominal spasms/ibs). )  . diphenoxylate-atropine (LOMOTIL) 2.5-0.025 MG tablet Take 1 tablet by mouth 4 (four) times daily as needed for diarrhea or loose stools.  . ferrous gluconate (FERGON) 324 MG tablet Take 324 mg by mouth 2 (two) times a week. Tuesday & Friday  . FLUARIX QUADRIVALENT 0.5 ML injection Inject 0.5 mLs into the muscle.   . metoprolol tartrate (LOPRESSOR) 25 MG tablet Take 0.5 tablets (12.5 mg total) by mouth 2 (two) times daily.  . Multiple Vitamin (MULTIVITAMIN WITH MINERALS) TABS tablet Take 1 tablet by mouth daily.  . ondansetron (ZOFRAN) 4 MG tablet Take 1 tablet (4 mg total) by mouth every 12 (twelve) hours as needed for nausea.  . pantoprazole (PROTONIX) 40 MG tablet Take 1 tablet (40 mg total) by mouth daily before breakfast.  . PROCTO-MED HC 2.5 % rectal cream Apply 1 application topically daily as needed for hemorrhoids.   . promethazine (PHENADOZ) 25 MG suppository Place 1 suppository (25 mg total) rectally 2 (two) times daily as needed for nausea or vomiting.  . sucralfate (CARAFATE) 1 g tablet Take 2 tablets (2 g total) by mouth 4 (four) times daily -  with meals and at bedtime.  . traMADol (ULTRAM) 50 MG tablet TAKE ONE TABLET BY MOUTH TWICE DAILY AS NEEDED  No facility-administered encounter medications on file as of 07/12/2017.      Objective: Blood pressure 106/70, pulse 66, temperature 98 F (36.7 C), temperature source Oral, resp. rate 18, height 5\' 4"  (1.626 m), weight 109 lb 1.6 oz (49.5 kg). Patient is alert and in no acute distress. Conjunctiva is pink. Sclera is nonicteric Oropharyngeal mucosa is normal. No neck masses or thyromegaly noted. Cardiac exam with regular rhythm normal S1 and S2. No murmur or gallop noted. Lungs are clear to auscultation. Abdomen is flat.  Bowel sounds are normal.  On palpation abdomen is soft with mild generalized tenderness which  is more pronounced in epigastric region and LLQ.  No rebound.  No organomegaly or masses. No LE edema or clubbing noted.  Labs/studies Results: CBC from 02/28/2017 WBC 8.9, H&H 14.3 and 44.1 and platelet count 312 K.   Assessment:  #1.  Chronic abdominal pain.  Pain is mainly in epigastric region and left lower quadrant of abdomen.  She has a history of refractory gastric ulcer for which she had surgery a few years ago.  Recent EGD was negative for recurrent ulcer disease.  Therefore this pain is either due to dyspepsia or IBS.  LLQ abdominal pain would appear to be due to IBS.  Further workup at this time.  #2.  BS with diarrhea.  She may also have dumping syndrome.  She is using antidiarrheal and anti-spasmodic sparingly because of concern for gastroparesis.  She is maintaining her weight.  #3.  Vitamin D deficiency.  She has not responded to high dose therapy in the past.  Vitamin D level has been gradually coming up.  She does not have any other abnormalities to suggest malabsorption.  #4.  History of iron deficiency anemia.  She has required parenteral iron in the past.  Last H&H was normal.  She is due for follow-up studies.  #5.  Anxiety state.  She remains on alprazolam.  She will need refill today.  #6.  Intermittent nausea in association with abdominal pain.  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  #7.  GERD.  Doing well with PPI.   Plan:  Prescription given for alprazolam for 1 month with 2 refills. Prescription also given for ondansetron last pantoprazole. Patient will go to the lab for CBC and serum ferritin. Medication list updated. Office visit in 6 months.

## 2017-07-13 LAB — CBC
HCT: 37.7 % (ref 35.0–45.0)
HEMOGLOBIN: 12.7 g/dL (ref 11.7–15.5)
MCH: 30 pg (ref 27.0–33.0)
MCHC: 33.7 g/dL (ref 32.0–36.0)
MCV: 88.9 fL (ref 80.0–100.0)
MPV: 10.4 fL (ref 7.5–12.5)
PLATELETS: 181 10*3/uL (ref 140–400)
RBC: 4.24 10*6/uL (ref 3.80–5.10)
RDW: 12 % (ref 11.0–15.0)
WBC: 6 10*3/uL (ref 3.8–10.8)

## 2017-07-13 LAB — VITAMIN D 25 HYDROXY (VIT D DEFICIENCY, FRACTURES): VIT D 25 HYDROXY: 19 ng/mL — AB (ref 30–100)

## 2017-07-13 LAB — FERRITIN: Ferritin: 51 ng/mL (ref 20–288)

## 2017-09-01 ENCOUNTER — Other Ambulatory Visit (INDEPENDENT_AMBULATORY_CARE_PROVIDER_SITE_OTHER): Payer: Self-pay | Admitting: Internal Medicine

## 2017-11-07 ENCOUNTER — Other Ambulatory Visit (INDEPENDENT_AMBULATORY_CARE_PROVIDER_SITE_OTHER): Payer: Self-pay | Admitting: Internal Medicine

## 2017-11-07 DIAGNOSIS — F411 Generalized anxiety disorder: Secondary | ICD-10-CM

## 2018-01-10 ENCOUNTER — Encounter (INDEPENDENT_AMBULATORY_CARE_PROVIDER_SITE_OTHER): Payer: Self-pay | Admitting: Internal Medicine

## 2018-01-10 ENCOUNTER — Ambulatory Visit (INDEPENDENT_AMBULATORY_CARE_PROVIDER_SITE_OTHER): Payer: BLUE CROSS/BLUE SHIELD | Admitting: Internal Medicine

## 2018-01-10 VITALS — BP 102/68 | HR 64 | Temp 98.2°F | Resp 18 | Ht 64.0 in | Wt 109.8 lb

## 2018-01-10 DIAGNOSIS — E559 Vitamin D deficiency, unspecified: Secondary | ICD-10-CM

## 2018-01-10 DIAGNOSIS — Z862 Personal history of diseases of the blood and blood-forming organs and certain disorders involving the immune mechanism: Secondary | ICD-10-CM | POA: Diagnosis not present

## 2018-01-10 DIAGNOSIS — G8929 Other chronic pain: Secondary | ICD-10-CM

## 2018-01-10 DIAGNOSIS — F411 Generalized anxiety disorder: Secondary | ICD-10-CM

## 2018-01-10 DIAGNOSIS — R140 Abdominal distension (gaseous): Secondary | ICD-10-CM | POA: Diagnosis not present

## 2018-01-10 DIAGNOSIS — R1310 Dysphagia, unspecified: Secondary | ICD-10-CM | POA: Diagnosis not present

## 2018-01-10 DIAGNOSIS — R1013 Epigastric pain: Secondary | ICD-10-CM

## 2018-01-10 LAB — CBC
HCT: 36.9 % (ref 35.0–45.0)
Hemoglobin: 12.4 g/dL (ref 11.7–15.5)
MCH: 29 pg (ref 27.0–33.0)
MCHC: 33.6 g/dL (ref 32.0–36.0)
MCV: 86.2 fL (ref 80.0–100.0)
MPV: 10.4 fL (ref 7.5–12.5)
PLATELETS: 199 10*3/uL (ref 140–400)
RBC: 4.28 10*6/uL (ref 3.80–5.10)
RDW: 12.5 % (ref 11.0–15.0)
WBC: 5.1 10*3/uL (ref 3.8–10.8)

## 2018-01-10 MED ORDER — SUCRALFATE 1 G PO TABS: 2.0000 g | ORAL_TABLET | Freq: Every day | ORAL | 5 refills | Status: DC

## 2018-01-10 MED ORDER — DIPHENOXYLATE-ATROPINE 2.5-0.025 MG PO TABS: ORAL_TABLET | ORAL | 2 refills | Status: DC

## 2018-01-10 MED ORDER — SIMETHICONE 180 MG PO CAPS: 180.0000 mg | ORAL_CAPSULE | Freq: Two times a day (BID) | ORAL | 0 refills | Status: AC | PRN

## 2018-01-10 MED ORDER — ALPRAZOLAM 0.25 MG PO TABS: 0.2500 mg | ORAL_TABLET | Freq: Three times a day (TID) | ORAL | 2 refills | Status: DC | PRN

## 2018-01-10 NOTE — Patient Instructions (Signed)
Physician will call with results of blood test and barium study when completed. 

## 2018-01-10 NOTE — Progress Notes (Signed)
Presenting complaint;  Follow-up for chronic abdominal pain GERD and diarrhea.  Subjective:  Patient is 62 year old Caucasian female who is here for scheduled visit.  She was last seen on 07/12/2017.  She has not lost any weight since then.  She states she has not required trip to emergency room or hospitalizations since her last visit.  She says heartburn is well controlled.  She may have an episode every now and then.  However for the last few weeks she has noted painful swallowing.  She points to lower sternal area as site of pain.  Over the last 2 to 3 weeks she has had 2-3 episodes.  She also complains of frequent nausea without vomiting.  She remains with 3-4 stools per day.  Stool consistency varies between loose to formed.  She denies melena or frank rectal bleeding.  She may notice blood on the tissue occasionally.  She also complains of flatus as well as epigastric pain which is experience a couple times a week.  She says her appetite is up and down.    Current Medications: Outpatient Encounter Medications as of 01/10/2018  Medication Sig  . albuterol (PROVENTIL HFA;VENTOLIN HFA) 108 (90 BASE) MCG/ACT inhaler Inhale 2 puffs into the lungs every 6 (six) hours as needed for wheezing or shortness of breath.   . ALPRAZolam (XANAX) 0.25 MG tablet TAKE 1 TABLET BY MOUTH THREE TIMES DAILY AS NEEDED  . Azelastine HCl 0.15 % SOLN Place 1 spray into the nose daily as needed (sinus congestion).   . Cholecalciferol (VITAMIN D3) 5000 UNITS CAPS Take 5,000 Units by mouth daily.   . cholestyramine (QUESTRAN) 4 G packet Take 1 packet (4 g total) by mouth 2 (two) times daily. Take 2 hours before or after taking other medicines. (Patient taking differently: Take 4 g by mouth 2 (two) times daily as needed. Takes if LOMOTIL does not work)  . dicyclomine (BENTYL) 10 MG capsule Take 1 capsule (10 mg total) by mouth 2 (two) times daily as needed for spasms. (Patient taking differently: Take 10 mg by mouth 2  (two) times daily as needed (abdominal spasms/ibs). )  . diphenoxylate-atropine (LOMOTIL) 2.5-0.025 MG tablet TAKE 1 TABLET BY MOUTH 4 TIMES DAILY AS NEEDED FOR  DIARRHEA  OR  LOOSE  STOOLS  . ferrous gluconate (FERGON) 324 MG tablet Take 324 mg by mouth 2 (two) times a week. Tuesday & Friday  . FLUARIX QUADRIVALENT 0.5 ML injection Inject 0.5 mLs into the muscle.   . metoprolol tartrate (LOPRESSOR) 25 MG tablet Take 0.5 tablets (12.5 mg total) by mouth 2 (two) times daily.  . Multiple Vitamin (MULTIVITAMIN WITH MINERALS) TABS tablet Take 1 tablet by mouth daily.  . ondansetron (ZOFRAN) 4 MG tablet Take 1 tablet (4 mg total) by mouth every 12 (twelve) hours as needed for nausea.  . pantoprazole (PROTONIX) 40 MG tablet Take 1 tablet (40 mg total) by mouth daily before breakfast.  . PROCTO-MED HC 2.5 % rectal cream Apply 1 application topically daily as needed for hemorrhoids.   . promethazine (PHENADOZ) 25 MG suppository Place 1 suppository (25 mg total) rectally 2 (two) times daily as needed for nausea or vomiting.  . sucralfate (CARAFATE) 1 g tablet Take 2 tablets (2 g total) by mouth 4 (four) times daily -  with meals and at bedtime.  . traMADol (ULTRAM) 50 MG tablet TAKE ONE TABLET BY MOUTH TWICE DAILY AS NEEDED   No facility-administered encounter medications on file as of 01/10/2018.  Objective: Blood pressure 102/68, pulse 64, temperature 98.2 F (36.8 C), temperature source Oral, resp. rate 18, height  (1.626 m), weight 109 lb 12.8 oz (49.8 kg). Patient is alert and in no acute distress. Conjunctiva is pink. Sclera is nonicteric Oropharyngeal mucosa is normal. No neck masses or thyromegaly noted. Cardiac exam with regular rhythm normal S1 and S2. No murmur or gallop noted. Lungs are clear to auscultation. Abdomen is symmetrical.  Bowel sounds are normal.  She has midline scar.  On palpation abdomen is soft with mild tenderness at LLQ L mild to moderate midepigastric  tenderness.  No organomegaly or masses. No LE edema or clubbing noted.  Labs/studies Results: Lab data from 07/12/2017  Vitamin D2 level 19.   WBC 6.0.  H&H 12.7 and 37.7 and platelet count 181K.    Serum ferritin 51.  Assessment:  #1.  Odynophagia.  Patient has a history of dysphagia and has undergone esophageal dilation multiple times.  However now she presents with painful swallowing.  Differential diagnoses include GERD with ulceration she could also have esophageal spasm.  Heartburn is well controlled with therapy.  #2.  Epigastric pain.  She has had this pain for years.  She has a history of nonhealing gastric ulcer which for which she underwent vagotomy and antrectomy.  EGD last year was negative for peptic ulcer disease.  Pain is possibly multifactorial including dyspepsia and perhaps bile reflux into the stomach. She also complains of bloating.  This may be due to gastroparesis or IBS.  No further work-up at this time other than symptomatic therapy.  #3.  Anxiety state.  She is due for medication refill.  #4.  Vitamin D deficiency.  This has been a chronic problem.  At one point she was on a higher dose vitamin D by mouth.  She does not appear to have malabsorptive syndrome.  #5.  History of iron deficiency anemia.  Lately her hemoglobin is been normal and iron stores are replenishing.  Plan:  Patient will go to the lab for CBC and vitamin D 2 level. Barium pill esophagogram. New prescription given for alprazolam and diphenoxylate. New prescription also given for sucralfate. Phazyme 180 mg p.o. twice daily PRN. Office visit in 6 months.

## 2018-01-11 LAB — VITAMIN D 25 HYDROXY (VIT D DEFICIENCY, FRACTURES): Vit D, 25-Hydroxy: 17 ng/mL — ABNORMAL LOW (ref 30–100)

## 2018-01-16 ENCOUNTER — Ambulatory Visit (HOSPITAL_COMMUNITY)
Admission: RE | Admit: 2018-01-16 | Discharge: 2018-01-16 | Disposition: A | Discharge status: Home / Self Care | Payer: BLUE CROSS/BLUE SHIELD | Source: Ambulatory Visit | Attending: Internal Medicine | Admitting: Internal Medicine

## 2018-01-16 DIAGNOSIS — K224 Dyskinesia of esophagus: Secondary | ICD-10-CM | POA: Insufficient documentation

## 2018-01-16 DIAGNOSIS — R1310 Dysphagia, unspecified: Secondary | ICD-10-CM | POA: Insufficient documentation

## 2018-01-23 ENCOUNTER — Ambulatory Visit (INDEPENDENT_AMBULATORY_CARE_PROVIDER_SITE_OTHER): Payer: BLUE CROSS/BLUE SHIELD | Admitting: Cardiovascular Disease

## 2018-01-23 ENCOUNTER — Encounter: Payer: Self-pay | Admitting: Cardiovascular Disease

## 2018-01-23 VITALS — BP 100/62 | HR 63 | Ht 64.0 in | Wt 108.0 lb

## 2018-01-23 DIAGNOSIS — R55 Syncope and collapse: Secondary | ICD-10-CM

## 2018-01-23 DIAGNOSIS — I471 Supraventricular tachycardia: Secondary | ICD-10-CM

## 2018-01-23 MED ORDER — METOPROLOL TARTRATE 25 MG PO TABS: 12.5000 mg | ORAL_TABLET | Freq: Two times a day (BID) | ORAL | 3 refills | Status: DC

## 2018-01-23 NOTE — Patient Instructions (Signed)

## 2018-01-23 NOTE — Progress Notes (Signed)
SUBJECTIVE: The patient presents for follow up of PSVT and near syncope.  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  She only has palpitations when climbing up steep inclines or steps.  She has occasional left leg cramps which wake her up at night.  She denies claudication pain.  She has occasional dizziness when bending over and standing up.  She denies chest pain.      Review of Systems: As per "subjective", otherwise negative.  Allergies  Allergen Reactions  . Aspirin Other (See Comments)    Ulcer, Reflux  . Codeine Nausea And Vomiting    Speeds heart up, dizziness  . Fentanyl Nausea And Vomiting    Confusion, weakness  . Nsaids Other (See Comments)    reflux, ulcer  . Propoxyphene N-Acetaminophen Nausea And Vomiting    Speeds heart up, dizziness    Current Outpatient Medications  Medication Sig Dispense Refill  . albuterol (PROVENTIL HFA;VENTOLIN HFA) 108 (90 BASE) MCG/ACT inhaler Inhale 2 puffs into the lungs every 6 (six) hours as needed for wheezing or shortness of breath.     . ALPRAZolam (XANAX) 0.25 MG tablet Take 1 tablet (0.25 mg total) by mouth 3 (three) times daily as needed. 90 tablet 2  . Azelastine HCl 0.15 % SOLN Place 1 spray into the nose daily as needed (sinus congestion).     . Cholecalciferol (VITAMIN D3) 5000 UNITS CAPS Take 5,000 Units by mouth daily.     . cholestyramine (QUESTRAN) 4 G packet Take 1 packet (4 g total) by mouth 2 (two) times daily. Take 2 hours before or after taking other medicines. (Patient taking differently: Take 4 g by mouth 2 (two) times daily as needed. Takes if LOMOTIL does not work) 60 each 2  . dicyclomine (BENTYL) 10 MG capsule Take 1 capsule (10 mg total) by mouth 2 (two) times daily as needed for spasms. (Patient taking differently: Take 10 mg by mouth 2 (two) times daily as needed (abdominal spasms/ibs). ) 60 capsule 5  . diphenoxylate-atropine (LOMOTIL) 2.5-0.025 MG tablet TAKE 1  TABLET BY MOUTH 4 TIMES DAILY AS NEEDED FOR  DIARRHEA  OR  LOOSE  STOOLS 120 tablet 2  . ferrous gluconate (FERGON) 324 MG tablet Take 324 mg by mouth 2 (two) times a week. Tuesday & Friday    . FLUARIX QUADRIVALENT 0.5 ML injection Inject 0.5 mLs into the muscle.     . metoprolol tartrate (LOPRESSOR) 25 MG tablet Take 0.5 tablets (12.5 mg total) by mouth 2 (two) times daily. 30 tablet 11  . Multiple Vitamin (MULTIVITAMIN WITH MINERALS) TABS tablet Take 1 tablet by mouth daily.    . ondansetron (ZOFRAN) 4 MG tablet Take 1 tablet (4 mg total) by mouth every 12 (twelve) hours as needed for nausea. 20 tablet 1  . pantoprazole (PROTONIX) 40 MG tablet Take 1 tablet (40 mg total) by mouth daily before breakfast. 30 tablet 5  . PROCTO-MED HC 2.5 % rectal cream Apply 1 application topically daily as needed for hemorrhoids.     . promethazine (PHENADOZ) 25 MG suppository Place 1 suppository (25 mg total) rectally 2 (two) times daily as needed for nausea or vomiting. 12 each 1  . Simethicone (PHAZYME) 180 MG CAPS Take 1 capsule (180 mg total) by mouth 2 (two) times daily as needed.  0  . sucralfate (CARAFATE) 1 g tablet Take 2 tablets (2 g total) by mouth at bedtime. 60 tablet 5   No  current facility-administered medications for this visit.     Past Medical History:  Diagnosis Date  . Anemia   . Anxiety   . Chest pain    Nuclear, normal, 2010,  //   hospital August, 2013 no evidence of injury  . Ejection fraction    EF 65%, echo, April 19, 2012  . Gastric ulcer   . GERD (gastroesophageal reflux disease)   . History of palpitations   . Irritable bowel syndrome   . Mitral regurgitation    Mild, echo, August, 2013  . Palpitations   . Reactive airway disease    Pulmonary function studies from June, 2013, show the patient does have significant response to bronchodilators.  . Syncope    Vasovagal    Past Surgical History:  Procedure Laterality Date  . ABDOMINAL HYSTERECTOMY    . BIOPSY   08/04/2016   Procedure: BIOPSY;  Surgeon: Malissa Hippo, MD;  Location: [facility];  Service: Endoscopy;;  gastric  . CHOLECYSTECTOMY    . COLONOSCOPY    . COLONOSCOPY WITH PROPOFOL N/A 03/04/2017   Procedure: COLONOSCOPY WITH PROPOFOL;  Surgeon: Malissa Hippo, MD;  Location: [facility];  Service: Endoscopy;  Laterality: N/A;  10:10  . ESOPHAGEAL DILATION  06/07/2014   Procedure: ESOPHAGEAL DILATION;  Surgeon: Malissa Hippo, MD;  Location: [facility];  Service: Endoscopy;;  . ESOPHAGEAL DILATION N/A 08/27/2015   Procedure: ESOPHAGEAL DILATION;  Surgeon: Malissa Hippo, MD;  [facility]: [facility];  Service: Endoscopy;  Laterality: N/A;  . ESOPHAGEAL DILATION  08/04/2016   Procedure: ESOPHAGEAL DILATION;  Surgeon: Malissa Hippo, MD;  [facility]: [facility];  Service: Endoscopy;;  . ESOPHAGEAL DILATION N/A 04/13/2017   Procedure: ESOPHAGEAL DILATION;  Surgeon: Malissa Hippo, MD;  [facility]: [facility];  Service: Endoscopy;  Laterality: N/A;  . ESOPHAGOGASTRODUODENOSCOPY  02/04/2012   Procedure: ESOPHAGOGASTRODUODENOSCOPY (EGD);  Surgeon: Malissa Hippo, MD;  [facility]: [facility];  Service: Endoscopy;  Laterality: N/A;  320  . ESOPHAGOGASTRODUODENOSCOPY  06/02/2012   Procedure: ESOPHAGOGASTRODUODENOSCOPY (EGD);  Surgeon: Malissa Hippo, MD;  [facility]: [facility];  Service: Endoscopy;  Laterality: N/A;  1050  . ESOPHAGOGASTRODUODENOSCOPY N/A 06/07/2014   Procedure: ESOPHAGOGASTRODUODENOSCOPY (EGD);  Surgeon: Malissa Hippo, MD;  [facility]: [facility];  Service: Endoscopy;  Laterality: N/A;  1035-rescheduled 9/25 @ 8:30 Ann to notify pt  . ESOPHAGOGASTRODUODENOSCOPY N/A 02/17/2015   Procedure: ESOPHAGOGASTRODUODENOSCOPY (EGD);  Surgeon: Malissa Hippo, MD;  [facility]: [facility];  Service: Endoscopy;  Laterality: N/A;  730  . ESOPHAGOGASTRODUODENOSCOPY N/A 08/27/2015   Procedure: ESOPHAGOGASTRODUODENOSCOPY (EGD);  Surgeon: Malissa Hippo, MD;  [facility];  Service: Endoscopy;  Laterality: N/A;  1155  . ESOPHAGOGASTRODUODENOSCOPY N/A 08/04/2016   Procedure: ESOPHAGOGASTRODUODENOSCOPY (EGD);  Surgeon: Malissa Hippo, MD;  [facility]: [facility];  Service: Endoscopy;  Laterality: N/A;  225  . ESOPHAGOGASTRODUODENOSCOPY N/A 04/13/2017   Procedure: ESOPHAGOGASTRODUODENOSCOPY (EGD);  Surgeon: Malissa Hippo, MD;  [facility]: [facility];  Service: Endoscopy;  Laterality: N/A;  . ESOPHAGOGASTRODUODENOSCOPY (EGD) WITH ESOPHAGEAL DILATION N/A 05/31/2013   Procedure: ESOPHAGOGASTRODUODENOSCOPY (EGD) WITH ESOPHAGEAL DILATION;  Surgeon: Malissa Hippo, MD;  [facility]: [facility];  Service: Endoscopy;  Laterality: N/A;  . FLEXIBLE SIGMOIDOSCOPY N/A 05/31/2013   Procedure: FLEXIBLE SIGMOIDOSCOPY;  Surgeon: Malissa Hippo, MD;  [facility];  Service: Endoscopy;  Laterality: N/A;  250  . FLEXIBLE SIGMOIDOSCOPY N/A 06/07/2014   Procedure: FLEXIBLE  SIGMOIDOSCOPY;  Surgeon: Malissa Hippo, MD;  [facility]: [facility];  Service: Endoscopy;  Laterality: N/A;  . GIVENS CAPSULE STUDY  05/03/2011   Procedure: GIVENS CAPSULE STUDY;  Surgeon: Malissa Hippo, MD;  Location: [facility];  Service: Endoscopy;  Laterality: N/A;  7:30 am  . STOMACH SURGERY    . UPPER GASTROINTESTINAL ENDOSCOPY      Social History   Socioeconomic History  . Marital status: Married    Spouse name: Not on file  . Number of children: Not on file  . Years of education: Not on file  . Highest education level: Not on file  Occupational History  . Not on file  Social Needs  . Financial resource strain: Not on file  . Food insecurity:    Worry: Not on file    Inability: Not on file  . Transportation needs:    Medical: Not on file    Non-medical: Not on file  Tobacco Use  . Smoking status: Never Smoker  . Smokeless tobacco: Never Used  Substance and Sexual Activity  . Alcohol use: No    Alcohol/week: 0.0 oz  . Drug use: No  . Sexual activity: Yes    Birth  control/protection: Surgical  Lifestyle  . Physical activity:    Days per week: Not on file    Minutes per session: Not on file  . Stress: Not on file  Relationships  . Social connections:    Talks on phone: Not on file    Gets together: Not on file    Attends religious service: Not on file    Active member of club or organization: Not on file    Attends meetings of clubs or organizations: Not on file    Relationship status: Not on file  . Intimate partner violence:    Fear of current or ex partner: Not on file    Emotionally abused: Not on file    Physically abused: Not on file    Forced sexual activity: Not on file  Other Topics Concern  . Not on file  Social History Narrative  . Not on file     Vitals:   01/23/18 1011  BP: 100/62  Pulse: 63  SpO2: 96%  Weight: 108 lb (49 kg)  Height:  (1.626 m)    Wt Readings from Last 3 Encounters:  01/23/18 108 lb (49 kg)  01/10/18 109 lb 12.8 oz (49.8 kg)  07/12/17 109 lb 1.6 oz (49.5 kg)     PHYSICAL EXAM General: NAD HEENT: Normal. Neck: No JVD, no thyromegaly. Lungs: Clear to auscultation bilaterally with normal respiratory effort. CV: Regular rate and rhythm, normal S1/S2, no S3/S4, no murmur. No pretibial or periankle edema.  No carotid bruit.   Abdomen: Soft, nontender, no distention.  Neurologic: Alert and oriented.  Psych: Normal affect. Skin: Normal. Musculoskeletal: No gross deformities.    ECG: Most recent ECG reviewed.   Labs: Lab Results  Component Value Date/Time   K 4.0 02/28/2017 12:52 PM   BUN 11 02/28/2017 12:52 PM   CREATININE 0.77 02/28/2017 12:52 PM   CREATININE 0.74 01/04/2017 10:44 AM   ALT 12 01/04/2017 10:44 AM   TSH 1.006 06/17/2014 11:33 AM   HGB 12.4 01/10/2018 12:53 PM     Lipids: No results found for: LDLCALC, LDLDIRECT, CHOL, TRIG, HDL     ASSESSMENT AND PLAN:  1. Near syncope/dizziness:  Symptomatically stable.  No changes to therapy.  2. Palpitations/PSVT:  To  medically  stable on metoprolol 12.5 mg twice daily.  No changes.   Disposition: Follow up 1 year   Prentice Docker, M.D., F.A.C.C.

## 2018-01-23 NOTE — Addendum Note (Signed)
Addended by: Lesle Chris on: 01/23/2018 10:29 AM   Modules accepted: Orders

## 2018-03-15 ENCOUNTER — Other Ambulatory Visit (INDEPENDENT_AMBULATORY_CARE_PROVIDER_SITE_OTHER): Payer: Self-pay | Admitting: *Deleted

## 2018-03-15 DIAGNOSIS — K610 Anal abscess: Secondary | ICD-10-CM

## 2018-03-15 DIAGNOSIS — K625 Hemorrhage of anus and rectum: Secondary | ICD-10-CM

## 2018-03-15 MED ORDER — NYSTATIN-TRIAMCINOLONE 100000-0.1 UNIT/GM-% EX OINT: 1.0000 "application " | TOPICAL_OINTMENT | Freq: Two times a day (BID) | CUTANEOUS | 0 refills | Status: DC

## 2018-03-27 ENCOUNTER — Telehealth (INDEPENDENT_AMBULATORY_CARE_PROVIDER_SITE_OTHER): Payer: Self-pay | Admitting: *Deleted

## 2018-03-27 NOTE — Telephone Encounter (Signed)
Patient left a message on the voicemail. She stated that that for the last 3-4 days her diarrhea is worse and is green in color. She is asking if she needs to take the Lomotil , Questran or take both of them.   Per Dr.Rehman the patient needs to take the Questran as prescribed.If after a few days she is no better she will need to take in addition to the Questran , the Lomotil. Patient needs to watch the Lomotil and taking it with her other medications.  Patient was called and a message was left on her voicemail with Dr.Rehman's recommendations.

## 2018-05-08 ENCOUNTER — Other Ambulatory Visit (INDEPENDENT_AMBULATORY_CARE_PROVIDER_SITE_OTHER): Payer: Self-pay | Admitting: Internal Medicine

## 2018-05-08 DIAGNOSIS — F411 Generalized anxiety disorder: Secondary | ICD-10-CM

## 2018-07-11 ENCOUNTER — Encounter (INDEPENDENT_AMBULATORY_CARE_PROVIDER_SITE_OTHER): Payer: Self-pay | Admitting: Internal Medicine

## 2018-07-11 ENCOUNTER — Ambulatory Visit (INDEPENDENT_AMBULATORY_CARE_PROVIDER_SITE_OTHER): Payer: BLUE CROSS/BLUE SHIELD | Admitting: Internal Medicine

## 2018-07-11 VITALS — BP 108/76 | HR 72 | Temp 98.2°F | Resp 18 | Ht 64.0 in | Wt 115.9 lb

## 2018-07-11 DIAGNOSIS — R110 Nausea: Secondary | ICD-10-CM | POA: Diagnosis not present

## 2018-07-11 DIAGNOSIS — K219 Gastro-esophageal reflux disease without esophagitis: Secondary | ICD-10-CM | POA: Diagnosis not present

## 2018-07-11 DIAGNOSIS — R1310 Dysphagia, unspecified: Secondary | ICD-10-CM

## 2018-07-11 DIAGNOSIS — R1319 Other dysphagia: Secondary | ICD-10-CM

## 2018-07-11 DIAGNOSIS — K529 Noninfective gastroenteritis and colitis, unspecified: Secondary | ICD-10-CM

## 2018-07-11 DIAGNOSIS — F411 Generalized anxiety disorder: Secondary | ICD-10-CM

## 2018-07-11 MED ORDER — PANTOPRAZOLE SODIUM 40 MG PO TBEC: 40.0000 mg | DELAYED_RELEASE_TABLET | Freq: Every day | ORAL | 11 refills | Status: DC

## 2018-07-11 MED ORDER — ONDANSETRON HCL 4 MG PO TABS: 4.0000 mg | ORAL_TABLET | Freq: Two times a day (BID) | ORAL | 1 refills | Status: DC | PRN

## 2018-07-11 MED ORDER — ALPRAZOLAM 0.25 MG PO TABS: 0.2500 mg | ORAL_TABLET | Freq: Three times a day (TID) | ORAL | 2 refills | Status: DC | PRN

## 2018-07-11 MED ORDER — SUCRALFATE 1 G PO TABS: 2.0000 g | ORAL_TABLET | Freq: Every evening | ORAL | 5 refills | Status: DC | PRN

## 2018-07-11 MED ORDER — DIPHENOXYLATE-ATROPINE 2.5-0.025 MG PO TABS: ORAL_TABLET | ORAL | 2 refills | Status: DC

## 2018-07-11 NOTE — Progress Notes (Signed)
Presenting complaint;  Follow-up for GERD abdominal pain diarrhea and nausea.  Subjective:  Patient is 62 year old Caucasian female who is here for scheduled visit.  She was last seen in April 2019.  She says she feels about the same.  She certainly does not feel any worse.  She is not taking dicyclomine anymore.  She has intermittent nausea sometimes when she wakes up in the morning.  However she has not vomited in over 6 months.  She remains with epigastric pain which she describes as soreness it is worse after meals and experiences every day.  At times she wakes up with his pain.  Her appetite is good.  She has gained 6 pounds.  She remains with intermittent heartburn and regurgitation usually once or twice a week.  Similarly she has dysphagia once or twice a week with solids.  She has not had episode of food impaction recently.  She states she is a slow eater and chews her food well.  She remains with diarrhea.  She has anywhere from 1-4 stools per day.  Occasionally she may skip a day.  She denies melena or rectal bleeding. She needs multiple refills.  Current Medications: Outpatient Encounter Medications as of 07/11/2018  Medication Sig  . ALPRAZolam (XANAX) 0.25 MG tablet TAKE 1 TABLET BY MOUTH THREE TIMES DAILY AS NEEDED  . Azelastine HCl 0.15 % SOLN Place 1 spray into the nose daily as needed (sinus congestion).   . Cholecalciferol (VITAMIN D3) 5000 UNITS CAPS Take 5,000 Units by mouth daily.   . cholestyramine (QUESTRAN) 4 g packet DISSOLVE & TAKE ONE POWDER BY MOUTH TWICE DAILY, TAKE 2 HOURS BEFORE OR AFTER TAKING OTHER MEDICINES  . dicyclomine (BENTYL) 10 MG capsule Take 1 capsule (10 mg total) by mouth 2 (two) times daily as needed for spasms. (Patient taking differently: Take 10 mg by mouth 2 (two) times daily as needed (abdominal spasms/ibs). )  . diphenoxylate-atropine (LOMOTIL) 2.5-0.025 MG tablet TAKE 1 TABLET BY MOUTH 4 TIMES DAILY AS NEEDED FOR  DIARRHEA  OR  LOOSE  STOOLS  .  ferrous gluconate (FERGON) 324 MG tablet Take 324 mg by mouth 2 (two) times a week. Tuesday & Friday  . FLUARIX QUADRIVALENT 0.5 ML injection Inject 0.5 mLs into the muscle.   . metoprolol tartrate (LOPRESSOR) 25 MG tablet Take 0.5 tablets (12.5 mg total) by mouth 2 (two) times daily.  . Multiple Vitamin (MULTIVITAMIN WITH MINERALS) TABS tablet Take 1 tablet by mouth daily.  Marland Kitchen nystatin-triamcinolone ointment (MYCOLOG) Apply 1 application topically 2 (two) times daily.  . ondansetron (ZOFRAN) 4 MG tablet Take 1 tablet (4 mg total) by mouth every 12 (twelve) hours as needed for nausea.  . pantoprazole (PROTONIX) 40 MG tablet Take 1 tablet (40 mg total) by mouth daily before breakfast.  . PROCTO-MED HC 2.5 % rectal cream Apply 1 application topically daily as needed for hemorrhoids.   . promethazine (PHENADOZ) 25 MG suppository Place 1 suppository (25 mg total) rectally 2 (two) times daily as needed for nausea or vomiting.  . Simethicone (PHAZYME) 180 MG CAPS Take 1 capsule (180 mg total) by mouth 2 (two) times daily as needed.  . sucralfate (CARAFATE) 1 g tablet Take 2 tablets (2 g total) by mouth at bedtime.  . [DISCONTINUED] albuterol (PROVENTIL HFA;VENTOLIN HFA) 108 (90 BASE) MCG/ACT inhaler Inhale 2 puffs into the lungs every 6 (six) hours as needed for wheezing or shortness of breath.    No facility-administered encounter medications on file as of  07/11/2018.      Objective: Blood pressure 108/76, pulse 72, temperature 98.2 F (36.8 C), temperature source Oral, resp. rate 18, height 5\' 4"  (1.626 m), weight 115 lb 14.4 oz (52.6 kg). Patient is alert and in no acute distress. Conjunctiva is pink. Sclera is nonicteric Oropharyngeal mucosa is normal. No neck masses or thyromegaly noted. Cardiac exam with regular rhythm normal S1 and S2. No murmur or gallop noted. Lungs are clear to auscultation. Abdomen is symmetrical.  She has upper midline scar.  On palpation abdomen is soft with mild  generalized tenderness without guarding or rebound.  Tenderness is somewhat more prominent in mid epigastric region.  No organomegaly or masses. No LE edema or clubbing noted.  Labs/studies Results:  H&H was 12.4 and 36.9 on 01/10/2018   Assessment:  #1.  Chronic GERD.  Fair control with therapy.  Last EGD was in August 2018 did not reveal erosive esophagitis or Barrett's esophagus.  She will remain on current therapy.  #2.  Chronic anxiety.  She is on alprazolam.  Prescription will be refilled.  #3.  Intermittent nausea most likely secondary to ongoing GI symptoms and bile reflux.  #4.  Esophageal dysphagia.  She has a history of Schatzki's ring but none was found on her last EGD with dilation of August 2018.  Will monitor this symptom.  #5. Chronic diarrhea.  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  She appears to be doing better than she has in the past.   Plan:  Discontinue dicyclomine. New prescription given for pantoprazole 40 mg p.o. every morning 90 doses with 3 refills. New prescription also given for alprazolam, diphenoxylate, ondansetron and sucralfate. Will review blood work from Dr. Bonnita Levan office before any tests ordered. OV in six months.

## 2018-07-11 NOTE — Patient Instructions (Signed)
Will request copy of recent blood work from Dr. Vyas's office. 

## 2018-07-21 IMAGING — RF DG ESOPHAGUS
14 of 15 series · 15 of 24 positions shown · non-contrast
Comparison: None

CLINICAL DATA: Dysphagia for solids, prior esophageal dilatations
most recently 1 year ago

EXAM:
ESOPHOGRAM / BARIUM SWALLOW / BARIUM TABLET STUDY
TECHNIQUE: Combined double contrast and single contrast examination performed
using effervescent crystals, thick barium liquid, and thin barium
liquid. The patient was observed with fluoroscopy swallowing a 13 mm
barium sulphate tablet.
FLUOROSCOPY TIME:  Fluoroscopy Time:  2 minutes 6 seconds
Radiation Exposure Index (if provided by the fluoroscopic device):
16.8 mGy
Number of Acquired Spot Images: multiple fluoroscopic screen
captures

[Series 1: cp_standard · 0.18mm/px · 1 of 56 frames shown (1 of 14)]
[frame 9/56]
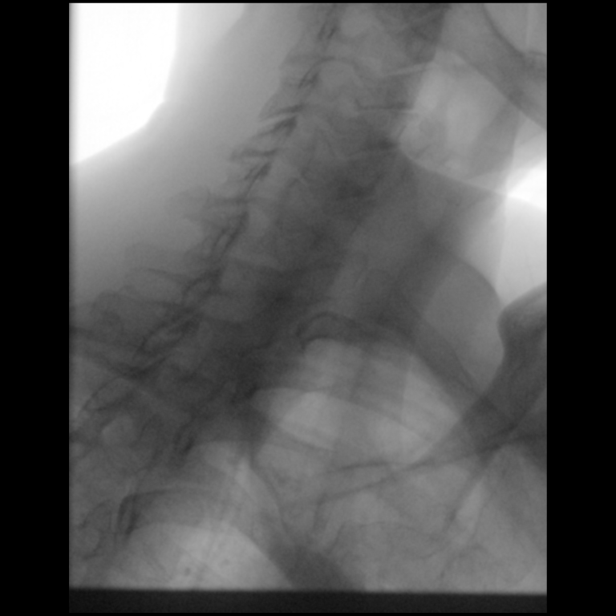

[Series 2: cp_standard · 0.18mm/px · 1 of 31 frames shown (2 of 14)]
[frame 16/31]
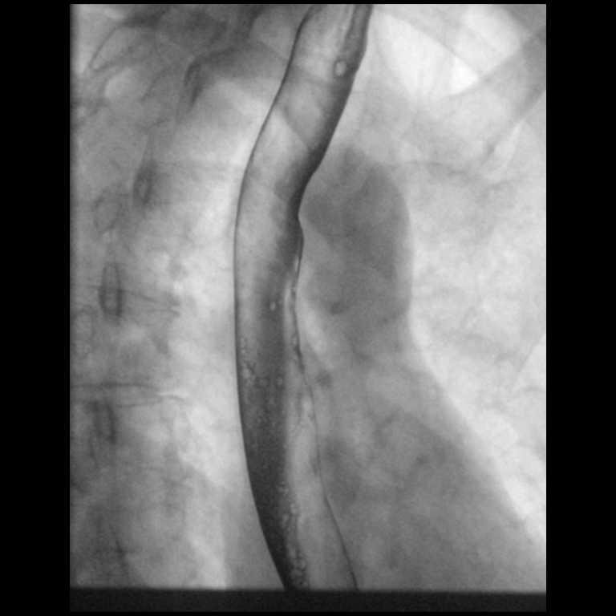

[Series 3: cp_standard · 0.18mm/px · 1 of 33 frames shown (3 of 14)]
[frame 29/33]
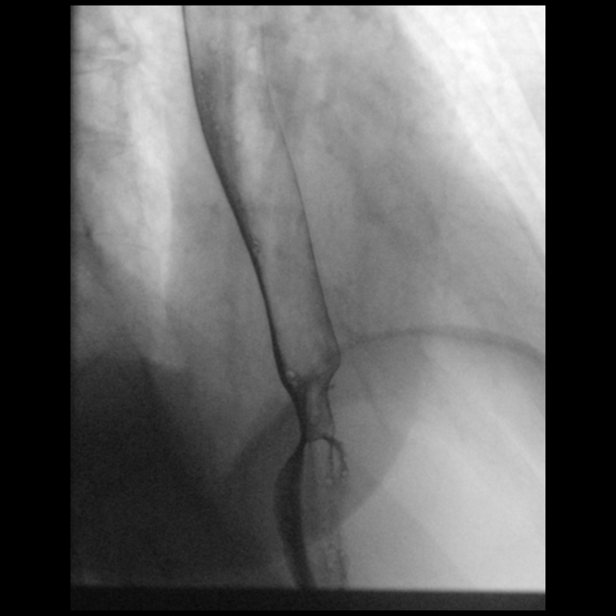

[Series 4: cp_standard · 0.18mm/px · 1 of 80 frames shown (4 of 14)]
[frame 41/80]
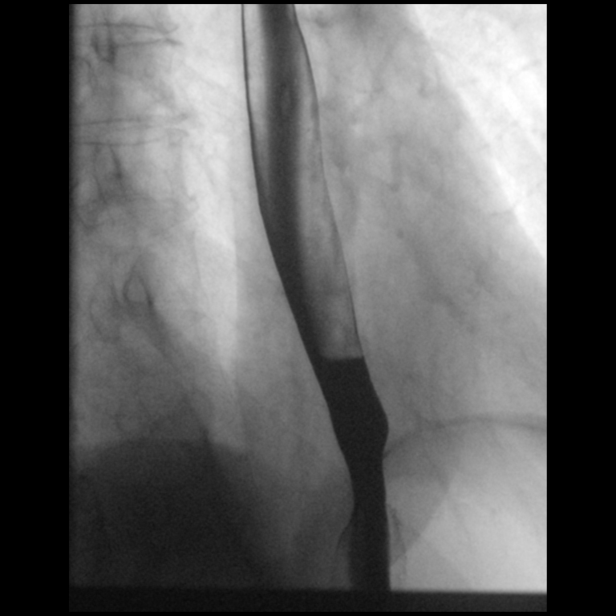

[Series 5: cp_standard · 0.18mm/px · 1 of 24 frames shown (5 of 14)]
[frame 21/24]
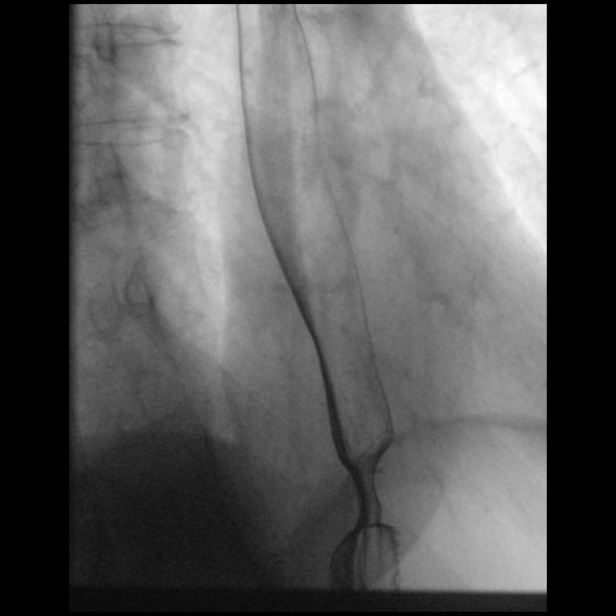

[Series 6: cp_standard · 0.18mm/px · 1 of 25 frames shown (6 of 14)]
[frame 13/25]
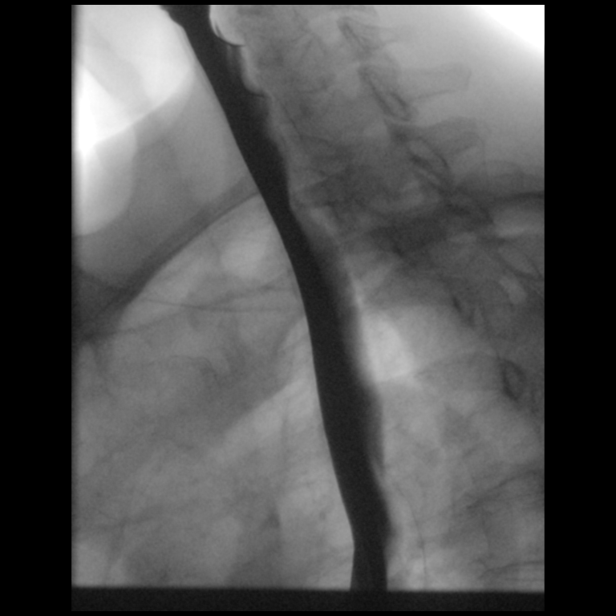

[Series 7: cp_standard · 0.18mm/px · 1 of 20 frames shown (7 of 14)]
[frame 18/20]
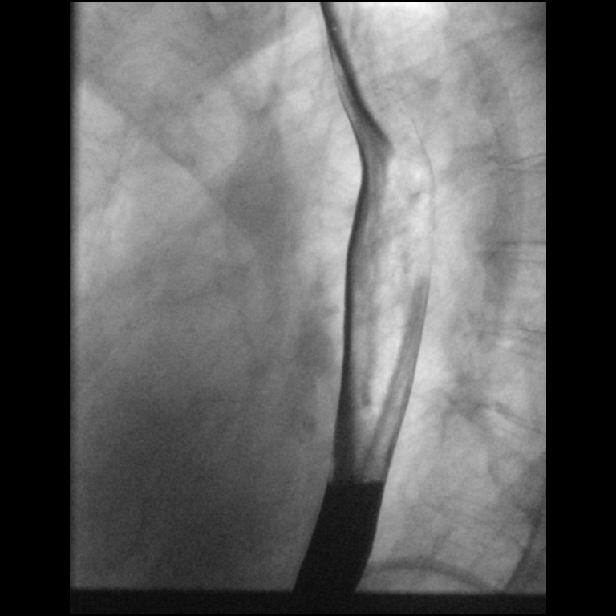

[Series 9: cp_standard · 0.28mm/px · 2 of 187 frames shown (8 of 14)]
[frame 29/187]
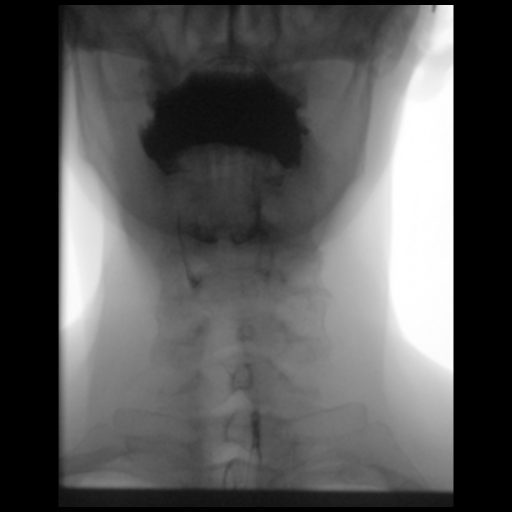
[frame 155/187]
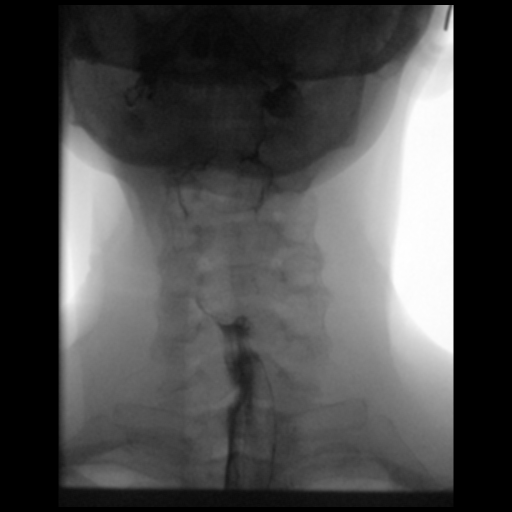

[Series 10: cp_standard · 0.28mm/px · 1 of 240 frames shown (9 of 14)]
[frame 205/240]
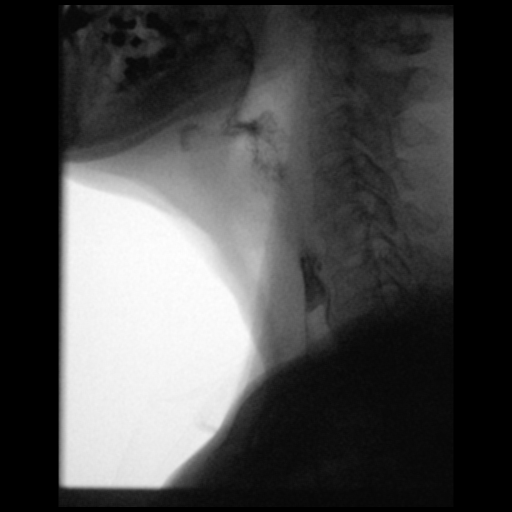

[Series 12: cp_standard · 0.19mm/px · 1 of 18 frames shown (10 of 14)]
[frame 3/18]
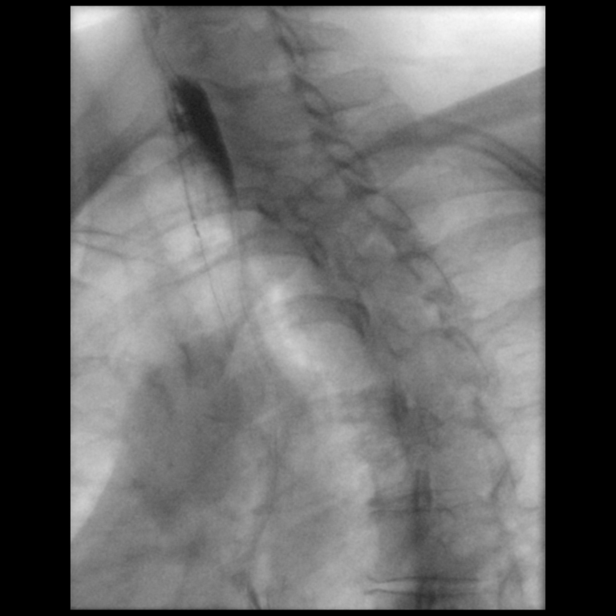

[Series 13: cp_standard · 0.19mm/px · 1 of 43 frames shown (11 of 14)]
[frame 22/43]
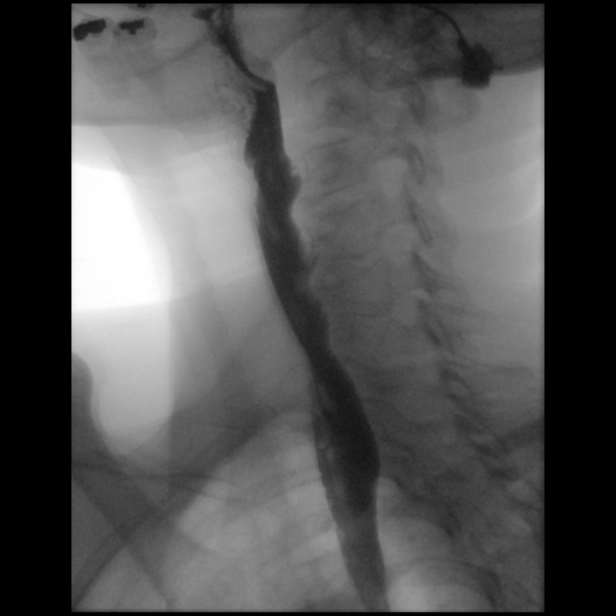

[Series 14: cp_standard · 0.19mm/px · 1 of 20 frames shown (12 of 14)]
[frame 18/20]
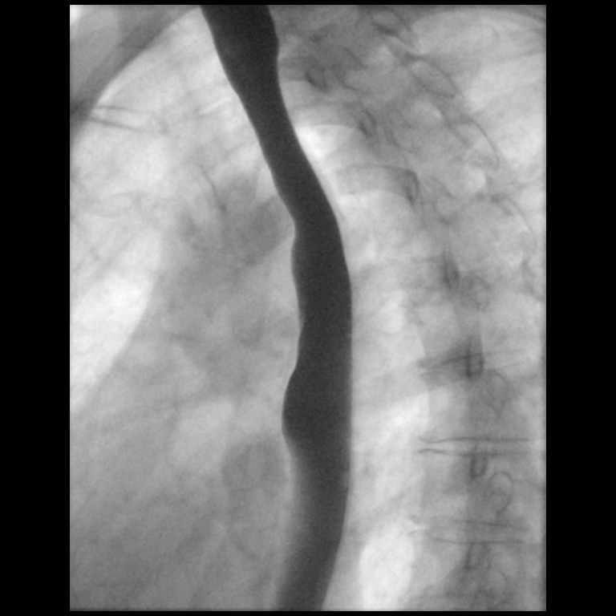

[Series 15: cp_standard · 0.19mm/px · 1 of 145 frames shown (13 of 14)]
[frame 90/145]
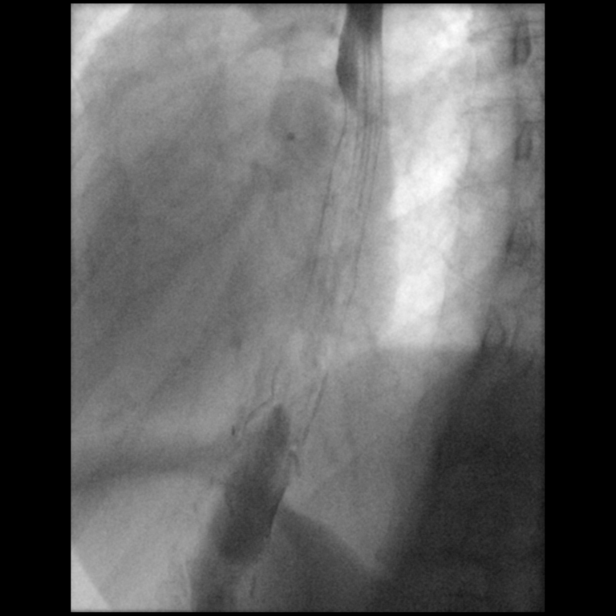

[Series 16: cp_standard · 0.19mm/px · 1 of 80 frames shown (14 of 14)]
[frame 69/80]
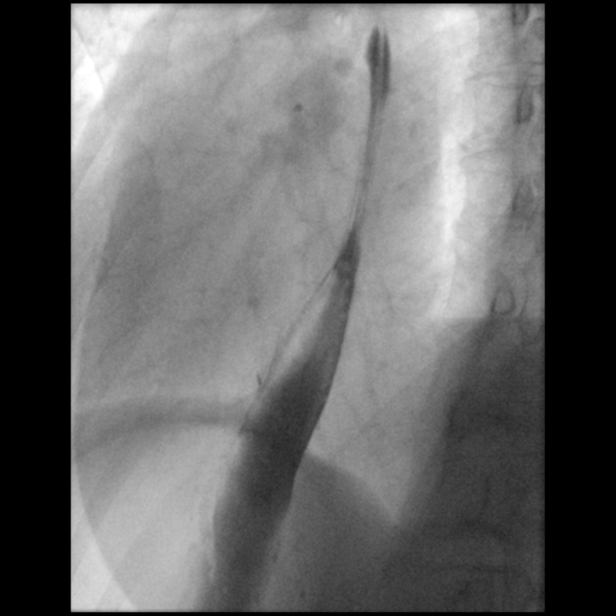

[15 of 24 positions shown; findings below may reference images not displayed]

FINDINGS: Esophageal distention: Normal distention without mass or stricture

Filling defects:  None

12.5 mm barium tablet: Transient delay at gastroesophageal junction,
the passed into stomach following a swallow of thick barium.

Motility:  Normal for age

Mucosa:  Smooth mucosa without irregularity or ulceration

Hypopharynx/cervical esophagus: Normal without laryngeal
penetration, aspiration or residuals.

Hiatal hernia:  Absent

GE reflux:  Not witnessed during exam

Other:  N/A
IMPRESSION: Age-related esophageal dysmotility.

Otherwise negative exam, without evidence of esophageal mass or
stricture.

## 2018-11-10 ENCOUNTER — Other Ambulatory Visit (INDEPENDENT_AMBULATORY_CARE_PROVIDER_SITE_OTHER): Payer: Self-pay | Admitting: Internal Medicine

## 2018-11-10 DIAGNOSIS — F411 Generalized anxiety disorder: Secondary | ICD-10-CM

## 2019-01-09 ENCOUNTER — Other Ambulatory Visit: Payer: Self-pay

## 2019-01-09 ENCOUNTER — Encounter (INDEPENDENT_AMBULATORY_CARE_PROVIDER_SITE_OTHER): Payer: Self-pay | Admitting: Internal Medicine

## 2019-01-09 ENCOUNTER — Ambulatory Visit (INDEPENDENT_AMBULATORY_CARE_PROVIDER_SITE_OTHER): Payer: BLUE CROSS/BLUE SHIELD | Admitting: Internal Medicine

## 2019-01-09 VITALS — BP 131/84 | HR 63 | Temp 98.3°F | Resp 18 | Ht 64.0 in | Wt 109.5 lb

## 2019-01-09 DIAGNOSIS — K219 Gastro-esophageal reflux disease without esophagitis: Secondary | ICD-10-CM | POA: Diagnosis not present

## 2019-01-09 DIAGNOSIS — G8929 Other chronic pain: Secondary | ICD-10-CM

## 2019-01-09 DIAGNOSIS — F411 Generalized anxiety disorder: Secondary | ICD-10-CM

## 2019-01-09 DIAGNOSIS — Z862 Personal history of diseases of the blood and blood-forming organs and certain disorders involving the immune mechanism: Secondary | ICD-10-CM | POA: Diagnosis not present

## 2019-01-09 DIAGNOSIS — K580 Irritable bowel syndrome with diarrhea: Secondary | ICD-10-CM

## 2019-01-09 DIAGNOSIS — R1013 Epigastric pain: Secondary | ICD-10-CM

## 2019-01-09 DIAGNOSIS — R110 Nausea: Secondary | ICD-10-CM

## 2019-01-09 MED ORDER — ALPRAZOLAM 0.25 MG PO TABS: 0.2500 mg | ORAL_TABLET | Freq: Three times a day (TID) | ORAL | 2 refills | Status: DC | PRN

## 2019-01-09 MED ORDER — DIPHENOXYLATE-ATROPINE 2.5-0.025 MG PO TABS: ORAL_TABLET | ORAL | 2 refills | Status: DC

## 2019-01-09 MED ORDER — SUCRALFATE 1 G PO TABS: 2.0000 g | ORAL_TABLET | Freq: Every evening | ORAL | 5 refills | Status: DC | PRN

## 2019-01-09 MED ORDER — PROMETHAZINE HCL 25 MG RE SUPP: 25.0000 mg | Freq: Two times a day (BID) | RECTAL | 1 refills | Status: DC | PRN

## 2019-01-09 MED ORDER — TRAMADOL HCL 50 MG PO TABS: 50.0000 mg | ORAL_TABLET | Freq: Three times a day (TID) | ORAL | 0 refills | Status: AC | PRN

## 2019-01-09 MED ORDER — ONDANSETRON HCL 4 MG PO TABS: 4.0000 mg | ORAL_TABLET | Freq: Two times a day (BID) | ORAL | 1 refills | Status: DC | PRN

## 2019-01-09 NOTE — Patient Instructions (Signed)
We will plan blood work when pandemic contained

## 2019-01-09 NOTE — Progress Notes (Signed)
Presenting complaint;  Follow-up for multiple medical problems.  Database and subjective:  Lindsay Lawrence is 63 year old Caucasian female with history of GERD dysphagia peptic ulcer disease status post surgery as well as chronic epigastric pain IBS and diarrhea iron deficiency anemia as well as vitamin D deficiency who is here for scheduled visit. She was last seen 6 months ago.  She states she feels about the same.  She has very good appetite.  She is not sure why she has lost 6 pounds since her last visit.  She remains with diarrhea.  While she may skip a day here and there on most days she has 3-4 bowel movements and mostly they all occur in the morning.  Occasionally she may have a soft stool.  She is still having occasional nocturnal bowel movement and she has had 2 accidents at night in the last 2 months.  She continues to have intermittent heartburn which is not bad.  She has at least 3-4 episodes per week.  She gets relief when she uses Gaviscon Phazyme or Gas-X.  She has frequent nausea but she has not vomited in the last 6 months.  Lately she is been eating crushed ice.  She wonders if her serum iron level is dropped.  She says she has not used tramadol for abdominal pain in over a year but she would need a prescription just in case.  She has daily epigastric pain.  She continues to complain of pain in left lower quadrant which is not as often.  She feels her dysphagia is stable she has not had any episode of food impaction. She takes 1 Questran dose every day.  She generally uses to diphenoxylate tablets per day.  She has not used proctoscopy form in over 3 months.  Similarly she uses promethazine suppository once in a while.  Similarly she has not used sucralfate in 1 month.  Current Medications: Outpatient Encounter Medications as of 01/09/2019  Medication Sig  . ALPRAZolam (XANAX) 0.25 MG tablet Take 1 tablet by mouth three times daily as needed  . Azelastine HCl 0.15 % SOLN Place 1 spray into  the nose daily as needed (sinus congestion).   . Cholecalciferol (VITAMIN D3) 5000 UNITS CAPS Take 5,000 Units by mouth daily.   . cholestyramine (QUESTRAN) 4 g packet DISSOLVE & TAKE ONE POWDER BY MOUTH TWICE DAILY, TAKE 2 HOURS BEFORE OR AFTER TAKING OTHER MEDICINES  . diphenoxylate-atropine (LOMOTIL) 2.5-0.025 MG tablet TAKE 1 TABLET BY MOUTH 4 TIMES DAILY AS NEEDED FOR  DIARRHEA  OR  LOOSE  STOOLS  . ferrous gluconate (FERGON) 324 MG tablet Take 324 mg by mouth 2 (two) times a week. Tuesday & Friday  . FLUARIX QUADRIVALENT 0.5 ML injection Inject 0.5 mLs into the muscle.   . metoprolol tartrate (LOPRESSOR) 25 MG tablet Take 0.5 tablets (12.5 mg total) by mouth 2 (two) times daily.  . Multiple Vitamin (MULTIVITAMIN WITH MINERALS) TABS tablet Take 1 tablet by mouth daily.  Marland Kitchen. nystatin-triamcinolone ointment (MYCOLOG) Apply 1 application topically 2 (two) times daily.  . ondansetron (ZOFRAN) 4 MG tablet Take 1 tablet (4 mg total) by mouth every 12 (twelve) hours as needed for nausea.  . pantoprazole (PROTONIX) 40 MG tablet Take 1 tablet (40 mg total) by mouth daily before breakfast.  . PROCTO-MED HC 2.5 % rectal cream Apply 1 application topically daily as needed for hemorrhoids.   . promethazine (PHENADOZ) 25 MG suppository Place 1 suppository (25 mg total) rectally 2 (two) times daily as needed  for nausea or vomiting.  . Simethicone (PHAZYME) 180 MG CAPS Take 1 capsule (180 mg total) by mouth 2 (two) times daily as needed.  . sucralfate (CARAFATE) 1 g tablet Take 2 tablets (2 g total) by mouth at bedtime as needed.   No facility-administered encounter medications on file as of 01/09/2019.      Objective: Blood pressure 131/84, pulse 63, temperature 98.3 F (36.8 C), temperature source Oral, resp. rate 18, height 5\' 4"  (1.626 m), weight 109 lb 8 oz (49.7 kg). Patient is alert and in no acute distress. Conjunctiva is pink. Sclera is nonicteric Oropharyngeal mucosa is normal. No neck masses  or thyromegaly noted. Cardiac exam with regular rhythm normal S1 and S2. No murmur or gallop noted. Lungs are clear to auscultation. Abdomen is flat and symmetrical.  She has upper midline scar.  On palpation is soft.  She has mild tenderness across lower abdomen and mild to moderate midepigastric tenderness with some guarding. No organomegaly or masses. No LE edema or clubbing noted.  Labs/studies Results:  Lab data from 07/17/2018 Bilirubin 0.5, AP 72, AST 16, ALT 9, total protein 5.9 and albumin 4.1. Glucose 95. BUN 9 and creatinine 0.80 Serum sodium 142, potassium 4.4, chloride 103, CO2 25 and serum calcium 9.0.   Assessment:  #1.  Chronic GERD.  Symptom control is fair.  She is having heartburn 3-4 times a week.  August 2018 and was negative for erosive disease.  She will continue to take pantoprazole 40 mg daily and use OTC antacids on as-needed basis.  #2.  Chronic epigastric pain.  History of peptic ulcer disease.  She is status post gastric surgery in November 2014.  She did not have recurrent ulcer disease.  Her pain is felt to be multifactorial.  She may also have duodenal gastric bile reflux as well as chronic dyspepsia.  She can use tramadol on as-needed basis which is not very often.  New prescription will be given today.  #3.  Chronic anxiety state.  She is on alprazolam.  It will be refilled today.  #4.  Chronic diarrhea felt to be due to IBS.  She is on diphenoxylate and cholestyramine.  #5.  History of iron deficiency anemia.  Hemoglobin in June last year was 12.5 g.  She is starting to use ice again.  Will check iron stores when COVID-19 pandemic over.  #6.  Nausea.  Frequent nausea which is mild and not intractable.  Will treat symptomatically with ondansetron.  #7.  History of dysphagia.  The symptoms remained stable.  Will monitor.  Plan:  Following medications were refilled.  Alprazolam, ondansetron, tramadol, diphenoxylate, sucralfate and Phenergan  suppositories. And CBC, comprehensive chemistry panel, serum iron TIBC and ferritin when COVID-19 pandemic contained. Office visit in 6 months.

## 2019-01-10 ENCOUNTER — Other Ambulatory Visit (INDEPENDENT_AMBULATORY_CARE_PROVIDER_SITE_OTHER): Payer: Self-pay | Admitting: Internal Medicine

## 2019-01-15 ENCOUNTER — Ambulatory Visit: Payer: BLUE CROSS/BLUE SHIELD | Admitting: Cardiovascular Disease

## 2019-03-07 ENCOUNTER — Telehealth: Payer: Self-pay | Admitting: *Deleted

## 2019-03-07 NOTE — Telephone Encounter (Signed)
Patient verbally consented for tele-health visits with [facility] and understands that her insurance company will be billed for the encounter.  Aware to have vitals available   

## 2019-03-19 ENCOUNTER — Encounter: Payer: Self-pay | Admitting: Cardiovascular Disease

## 2019-03-19 ENCOUNTER — Telehealth (INDEPENDENT_AMBULATORY_CARE_PROVIDER_SITE_OTHER): Payer: BC Managed Care – PPO | Admitting: Cardiovascular Disease

## 2019-03-19 VITALS — BP 112/77 | HR 65 | Ht 64.0 in | Wt 105.0 lb

## 2019-03-19 DIAGNOSIS — I471 Supraventricular tachycardia: Secondary | ICD-10-CM | POA: Diagnosis not present

## 2019-03-19 DIAGNOSIS — I959 Hypotension, unspecified: Secondary | ICD-10-CM

## 2019-03-19 MED ORDER — METOPROLOL TARTRATE 25 MG PO TABS: 12.5000 mg | ORAL_TABLET | Freq: Two times a day (BID) | ORAL | 1 refills | Status: DC

## 2019-03-19 NOTE — Addendum Note (Signed)
Addended by: Julian Hy T on: 03/19/2019 10:37 AM   Modules accepted: Orders

## 2019-03-19 NOTE — Progress Notes (Signed)
Virtual Visit via Telephone Note   This visit type was conducted due to national recommendations for restrictions regarding the COVID-19 Pandemic (e.g. social distancing) in an effort to limit this patient's exposure and mitigate transmission in our community.  Due to her co-morbid illnesses, this patient is at least at moderate risk for complications without adequate follow up.  This format is felt to be most appropriate for this patient at this time.  The patient did not have access to video technology/had technical difficulties with video requiring transitioning to audio format only (telephone).  All issues noted in this document were discussed and addressed.  No physical exam could be performed with this format.  Please refer to the patient's chart for her  consent to telehealth for [facility].   Date:  03/19/2019   ID:  Lindsay FirstSheryl L Lawrence, DOB 02-Jul-1956, MRN [id number]  Patient Location: Home Provider Location: Office  PCP:  Ignatius SpeckingVyas, Dhruv B, MD  Cardiologist:  Prentice DockerSuresh Calah Gershman, MD  Electrophysiologist:  None   Evaluation Performed:  Follow-Up Visit  Chief Complaint: PSVT  History of Present Illness:    Lindsay Lawrence is a 63 y.o. female with history of paroxysmal SVT.  She denies chest pain, palpitations, leg swelling, and shortness of breath.  She has occasional "weak spells "when she goes from lying down to sitting up in the mornings.  She is occasionally noticed her systolic blood pressure to be in the mid 80-90 range.  She occasionally feels weak but this is not consistent.  She wonders about the accuracy of her cuff.  The patient does not have symptoms concerning for COVID-19 infection (fever, chills, cough, or new shortness of breath).    Past Medical History:  Diagnosis Date   Anemia    Anxiety    Chest pain    Nuclear, normal, 2010,  //   hospital August, 2013 no evidence of injury   Ejection fraction    EF 65%, echo, April 19, 2012   Gastric ulcer    GERD  (gastroesophageal reflux disease)    History of palpitations    Irritable bowel syndrome    Mitral regurgitation    Mild, echo, August, 2013   Palpitations    Reactive airway disease    Pulmonary function studies from June, 2013, show the patient does have significant response to bronchodilators.   Syncope    Vasovagal   Past Surgical History:  Procedure Laterality Date   ABDOMINAL HYSTERECTOMY     BIOPSY  08/04/2016   Procedure: BIOPSY;  Surgeon: Malissa HippoNajeeb U Rehman, MD;  [facility];  Service: Endoscopy;;  gastric   CHOLECYSTECTOMY     COLONOSCOPY     COLONOSCOPY WITH PROPOFOL N/A 03/04/2017   Procedure: COLONOSCOPY WITH PROPOFOL;  Surgeon: Malissa Hippoehman, Najeeb U, MD;  [facility];  Service: Endoscopy;  Laterality: N/A;  10:10   ESOPHAGEAL DILATION  06/07/2014   Procedure: ESOPHAGEAL DILATION;  Surgeon: Malissa HippoNajeeb U Rehman, MD;  [facility];  Service: Endoscopy;;   ESOPHAGEAL DILATION N/A 08/27/2015   Procedure: ESOPHAGEAL DILATION;  Surgeon: Malissa HippoNajeeb U Rehman, MD;  [facility];  Service: Endoscopy;  Laterality: N/A;   ESOPHAGEAL DILATION  08/04/2016   Procedure: ESOPHAGEAL DILATION;  Surgeon: Malissa HippoNajeeb U Rehman, MD;  [facility];  Service: Endoscopy;;   ESOPHAGEAL DILATION N/A 04/13/2017   Procedure: ESOPHAGEAL DILATION;  Surgeon: Malissa Hippoehman, Najeeb U, MD;  [facility];  Service: Endoscopy;  Laterality: N/A;  ESOPHAGOGASTRODUODENOSCOPY  02/04/2012   Procedure: ESOPHAGOGASTRODUODENOSCOPY (EGD);  Surgeon: Rogene Houston, MD;  [facility];  Service: Endoscopy;  Laterality: N/A;  320   ESOPHAGOGASTRODUODENOSCOPY  06/02/2012   Procedure: ESOPHAGOGASTRODUODENOSCOPY (EGD);  Surgeon: Rogene Houston, MD;  [facility];  Service: Endoscopy;  Laterality: N/A;  1050   ESOPHAGOGASTRODUODENOSCOPY N/A 06/07/2014   Procedure: ESOPHAGOGASTRODUODENOSCOPY (EGD);  Surgeon: Rogene Houston, MD;  [facility];  Service: Endoscopy;  Laterality: N/A;  1035-rescheduled 9/25 @ 8:30 Ann to notify pt   ESOPHAGOGASTRODUODENOSCOPY N/A 02/17/2015   Procedure: ESOPHAGOGASTRODUODENOSCOPY (EGD);  Surgeon: Rogene Houston, MD;  [facility];  Service: Endoscopy;  Laterality: N/A;  730   ESOPHAGOGASTRODUODENOSCOPY N/A 08/27/2015   Procedure: ESOPHAGOGASTRODUODENOSCOPY (EGD);  Surgeon: Rogene Houston, MD;  [facility];  Service: Endoscopy;  Laterality: N/A;  1155   ESOPHAGOGASTRODUODENOSCOPY N/A 08/04/2016   Procedure: ESOPHAGOGASTRODUODENOSCOPY (EGD);  Surgeon: Rogene Houston, MD;  [facility];  Service: Endoscopy;  Laterality: N/A;  225   ESOPHAGOGASTRODUODENOSCOPY N/A 04/13/2017   Procedure: ESOPHAGOGASTRODUODENOSCOPY (EGD);  Surgeon: Rogene Houston, MD;  [facility];  Service: Endoscopy;  Laterality: N/A;   ESOPHAGOGASTRODUODENOSCOPY (EGD) WITH ESOPHAGEAL DILATION N/A 05/31/2013   Procedure: ESOPHAGOGASTRODUODENOSCOPY (EGD) WITH ESOPHAGEAL DILATION;  Surgeon: Rogene Houston, MD;  [facility];  Service: Endoscopy;  Laterality: N/A;   FLEXIBLE SIGMOIDOSCOPY N/A 05/31/2013   Procedure: FLEXIBLE SIGMOIDOSCOPY;  Surgeon: Rogene Houston, MD;  [facility];  Service: Endoscopy;  Laterality: N/A;  [place] N/A 06/07/2014   Procedure: FLEXIBLE SIGMOIDOSCOPY;  Surgeon: Rogene Houston, MD;  [facility];  Service: Endoscopy;  Laterality: N/A;   GIVENS CAPSULE STUDY  05/03/2011   Procedure: GIVENS CAPSULE STUDY;  Surgeon: Rogene Houston, MD;  [facility];  Service: Endoscopy;  Laterality: N/A;  7:30 am   STOMACH SURGERY     UPPER GASTROINTESTINAL ENDOSCOPY       Current Meds  Medication Sig   ALPRAZolam (XANAX) 0.25 MG tablet Take 1 tablet (0.25 mg total) by mouth 3 (three) times daily as needed.   Azelastine HCl 0.15 % SOLN Place 1 spray into the nose daily as needed (sinus congestion).      Cholecalciferol (VITAMIN D3) 5000 UNITS CAPS Take 5,000 Units by mouth daily.    cholestyramine (QUESTRAN) 4 g packet DISSOLVE & TAKE ONE POWDER BY MOUTH TWICE DAILY, TAKE 2 HOURS BEFORE OR AFTER TAKING OTHER MEDICINES   diphenoxylate-atropine (LOMOTIL) 2.5-0.025 MG tablet TAKE 1 TABLET BY MOUTH 4 TIMES DAILY AS NEEDED FOR  DIARRHEA  OR  LOOSE  STOOLS   ferrous gluconate (FERGON) 324 MG tablet Take 324 mg by mouth once a week. Tuesday & Friday   FLUARIX QUADRIVALENT 0.5 ML injection Inject 0.5 mLs into the muscle.    metoprolol tartrate (LOPRESSOR) 25 MG tablet Take 0.5 tablets (12.5 mg total) by mouth 2 (two) times daily.   Multiple Vitamin (MULTIVITAMIN WITH MINERALS) TABS tablet Take 1 tablet by mouth daily.   ondansetron (ZOFRAN) 4 MG tablet Take 1 tablet (4 mg total) by mouth every 12 (twelve) hours as needed for nausea.   pantoprazole (PROTONIX) 40 MG tablet Take 1 tablet (40 mg total) by mouth daily before breakfast.   PROCTO-MED HC 2.5 % rectal cream Apply 1 application topically daily as needed for hemorrhoids.    promethazine (PHENADOZ) 25 MG suppository Place 1 suppository (25  mg total) rectally 2 (two) times daily as needed for nausea or vomiting.   Simethicone (PHAZYME) 180 MG CAPS Take 1 capsule (180 mg total) by mouth 2 (two) times daily as needed.   sucralfate (CARAFATE) 1 g tablet Take 2 tablets (2 g total) by mouth at bedtime as needed.     Allergies:   Aspirin, Codeine, Fentanyl, Nsaids, and Propoxyphene n-acetaminophen   Social History   Tobacco Use   Smoking status: Never Smoker   Smokeless tobacco: Never Used  Substance Use Topics   Alcohol use: No    Alcohol/week: 0.0 standard drinks   Drug use: No     Family Hx: The patient's family history includes Healthy in her mother and sister; Heart attack in her father. There is no history of Colon cancer.  ROS:   Please see the history of present illness.     All other systems reviewed and are  negative.   Prior CV studies:   The following studies were reviewed today:  NA  Labs/Other Tests and Data Reviewed:    EKG:  No ECG reviewed.  Recent Labs: No results found for requested labs within last 8760 hours.   Recent Lipid Panel No results found for: CHOL, TRIG, HDL, CHOLHDL, LDLCALC, LDLDIRECT  Wt Readings from Last 3 Encounters:  03/19/19 105 lb (47.6 kg)  01/09/19 109 lb 8 oz (49.7 kg)  07/11/18 115 lb 14.4 oz (52.6 kg)     Objective:    Vital Signs:  BP 112/77    Pulse 65    Ht 5\' 4"  (1.626 m)    Wt 105 lb (47.6 kg)    BMI 18.02 kg/m    VITAL SIGNS:  reviewed  ASSESSMENT & PLAN:    1.  Palpitations/PSVT: Symptomatically stable on Lopressor 12.5 mg twice daily.  I will refill today.  2.  Hypotension: If she experiences symptomatic hypotension on a frequent basis, I will make an appointment for her have her blood pressure cuff checked against our blood pressure cuff in the office.  If this is a recurrent issue, I would consider midodrine and perhaps holding the evening dose of Lopressor.    COVID-19 Education: The signs and symptoms of COVID-19 were discussed with the patient and how to seek care for testing (follow up with PCP or arrange E-visit).  The importance of social distancing was discussed today.  Time:   Today, I have spent 10 minutes with the patient with telehealth technology discussing the above problems.     Medication Adjustments/Labs and Tests Ordered: Current medicines are reviewed at length with the patient today.  Concerns regarding medicines are outlined above.   Tests Ordered: No orders of the defined types were placed in this encounter.   Medication Changes: No orders of the defined types were placed in this encounter.   Follow Up:  Virtual Visit or In Person in 1 year(s)  Signed, Prentice DockerSuresh Detron Carras, MD  03/19/2019 10:03 AM    [facility]

## 2019-03-19 NOTE — Patient Instructions (Addendum)
Your physician wants you to follow-up in: 1 YEAR WITH DR KONESWARAN You will receive a reminder letter in the mail two months in advance. If you don't receive a letter, please call our office to schedule the follow-up appointment.  Your physician recommends that you continue on your current medications as directed. Please refer to the Current Medication list given to you today.  Thank you for choosing [facility]!!    

## 2019-05-14 ENCOUNTER — Encounter: Payer: Self-pay | Admitting: Internal Medicine

## 2019-07-10 ENCOUNTER — Ambulatory Visit (INDEPENDENT_AMBULATORY_CARE_PROVIDER_SITE_OTHER): Payer: BC Managed Care – PPO | Admitting: Internal Medicine

## 2019-07-10 ENCOUNTER — Encounter (INDEPENDENT_AMBULATORY_CARE_PROVIDER_SITE_OTHER): Payer: Self-pay | Admitting: Internal Medicine

## 2019-07-10 ENCOUNTER — Other Ambulatory Visit: Payer: Self-pay

## 2019-07-10 VITALS — BP 115/78 | HR 81 | Temp 98.5°F | Ht 64.0 in | Wt 107.0 lb

## 2019-07-10 DIAGNOSIS — K529 Noninfective gastroenteritis and colitis, unspecified: Secondary | ICD-10-CM | POA: Diagnosis not present

## 2019-07-10 DIAGNOSIS — F419 Anxiety disorder, unspecified: Secondary | ICD-10-CM | POA: Insufficient documentation

## 2019-07-10 DIAGNOSIS — R110 Nausea: Secondary | ICD-10-CM | POA: Diagnosis not present

## 2019-07-10 DIAGNOSIS — E559 Vitamin D deficiency, unspecified: Secondary | ICD-10-CM

## 2019-07-10 DIAGNOSIS — G8929 Other chronic pain: Secondary | ICD-10-CM

## 2019-07-10 DIAGNOSIS — R1319 Other dysphagia: Secondary | ICD-10-CM

## 2019-07-10 DIAGNOSIS — F411 Generalized anxiety disorder: Secondary | ICD-10-CM

## 2019-07-10 DIAGNOSIS — R1310 Dysphagia, unspecified: Secondary | ICD-10-CM

## 2019-07-10 DIAGNOSIS — K219 Gastro-esophageal reflux disease without esophagitis: Secondary | ICD-10-CM

## 2019-07-10 DIAGNOSIS — R1013 Epigastric pain: Secondary | ICD-10-CM

## 2019-07-10 MED ORDER — ONDANSETRON HCL 4 MG PO TABS: 4.0000 mg | ORAL_TABLET | Freq: Two times a day (BID) | ORAL | 1 refills | Status: DC | PRN

## 2019-07-10 MED ORDER — SUCRALFATE 1 G PO TABS: 2.0000 g | ORAL_TABLET | Freq: Every evening | ORAL | 5 refills | Status: DC | PRN

## 2019-07-10 MED ORDER — ALPRAZOLAM 0.25 MG PO TABS: 0.2500 mg | ORAL_TABLET | Freq: Three times a day (TID) | ORAL | 2 refills | Status: DC | PRN

## 2019-07-10 MED ORDER — PANTOPRAZOLE SODIUM 40 MG PO TBEC: 40.0000 mg | DELAYED_RELEASE_TABLET | Freq: Every day | ORAL | 3 refills | Status: DC

## 2019-07-10 MED ORDER — NYSTATIN-TRIAMCINOLONE 100000-0.1 UNIT/GM-% EX CREA: 1.0000 "application " | TOPICAL_CREAM | Freq: Two times a day (BID) | CUTANEOUS | 0 refills | Status: DC

## 2019-07-10 MED ORDER — PROMETHAZINE HCL 25 MG RE SUPP: 25.0000 mg | Freq: Two times a day (BID) | RECTAL | 1 refills | Status: DC | PRN

## 2019-07-10 MED ORDER — DIPHENOXYLATE-ATROPINE 2.5-0.025 MG PO TABS: ORAL_TABLET | ORAL | 2 refills | Status: DC

## 2019-07-10 NOTE — Progress Notes (Signed)
Presenting complaint;  Follow-up for GERD dysphagia abdominal pain diarrhea nausea and anxiety.  Database and subjective:  Patient is 63 year old Caucasian female who has multiple medical problems including chronic GERD history of dysphagia status post multiple esophageal dilations history of peptic ulcer disease status post truncal vagotomy and partial gastrectomy November 24 at [facility] history of iron deficiency anemia as well as vitamin D deficiency who also has developed anxiety due to multiple medical problems on anxiolytic who is here for scheduled visit.  She was last seen 6 months ago.  She brings along a copy of her recent blood work. She has multiple refills.  She states she gets pantoprazole from [place] and rest of the prescription from [place].  She says she does not have heartburn often but she is having dysphagia to solids 3-4 times a week.  Last EGD was in August 2018 revealing B1 anastomosis without recurrent ulceration.  No ring or stricture was noted.  Esophagus was dilated by passing 54 [place] Mal[place] dilator.  She wants to hold off on further evaluation as long as possible.  She states she is not ready to go back to tertiary center for esophageal manometry. She remains with diarrhea.  She has anywhere from 1-4 stools per day.  She generally takes 2 tablets of diphenoxylate daily.  He uses cholestyramine maybe once a week or once every 2 weeks.  She had 2 accidents during sleep.  She denies melena or rectal bleeding.  She remains with episodic nausea.  She uses ondansetron and if it does not work she uses Phenergan.  She says she has not vomited in the last 6 months.  She says her appetite is normal and she generally eats 3 meals a day. She remains with abdominal pain which is in epigastric region usually after meals and also left lower quadrant.  She says she has pain on most days although she can sometimes go few days without a pain.  Lately pain has not been  pronounced. She works at [employer].  She is worried that she may bring the virus home to her husband who has chronic illness with Crohn's disease and is on immunosuppressive.   Current Medications: Outpatient Encounter Medications as of 07/10/2019  Medication Sig  . ALPRAZolam (XANAX) 0.25 MG tablet Take 1 tablet (0.25 mg total) by mouth 3 (three) times daily as needed.  . Azelastine HCl 0.15 % SOLN Place 1 spray into the nose daily as needed (sinus congestion).   . Cholecalciferol (VITAMIN D3) 5000 UNITS CAPS Take 5,000 Units by mouth daily.   . cholestyramine (QUESTRAN) 4 g packet DISSOLVE & TAKE ONE POWDER BY MOUTH TWICE DAILY, TAKE 2 HOURS BEFORE OR AFTER TAKING OTHER MEDICINES  . diphenoxylate-atropine (LOMOTIL) 2.5-0.025 MG tablet TAKE 1 TABLET BY MOUTH 4 TIMES DAILY AS NEEDED FOR  DIARRHEA  OR  LOOSE  STOOLS  . ferrous gluconate (FERGON) 324 MG tablet Take 324 mg by mouth once a week. Tuesday & Friday  . FLUARIX QUADRIVALENT 0.5 ML injection Inject 0.5 mLs into the muscle.   . metoprolol tartrate (LOPRESSOR) 25 MG tablet Take 0.5 tablets (12.5 mg total) by mouth 2 (two) times daily.  . Multiple Vitamin (MULTIVITAMIN WITH MINERALS) TABS tablet Take 1 tablet by mouth daily.  . ondansetron (ZOFRAN) 4 MG tablet Take 1 tablet (4 mg total) by mouth every 12 (twelve) hours as needed for nausea.  . pantoprazole (PROTONIX) 40 MG tablet Take 1 tablet (40 mg total) by mouth daily before breakfast.  .  PROCTO-MED HC 2.5 % rectal cream Apply 1 application topically daily as needed for hemorrhoids.   . promethazine (PHENADOZ) 25 MG suppository Place 1 suppository (25 mg total) rectally 2 (two) times daily as needed for nausea or vomiting.  . Simethicone (PHAZYME) 180 MG CAPS Take 1 capsule (180 mg total) by mouth 2 (two) times daily as needed.  . sucralfate (CARAFATE) 1 g tablet Take 2 tablets (2 g total) by mouth at bedtime as needed.   No facility-administered encounter medications on file as of  07/10/2019.      Objective: Blood pressure 115/78, pulse 81, temperature 98.5 F (36.9 C), temperature source Oral, height _0  (1.626 m), weight 107 lb (48.5 kg). Patient is alert and in no acute distress. She is wearing facial mask. Conjunctiva is pink. Sclera is nonicteric Oropharyngeal mucosa is normal. No neck masses or thyromegaly noted. Cardiac exam with regular rhythm normal S1 and S2. No murmur or gallop noted. Lungs are clear to auscultation. Abdomen abdomen is flat with upper midline scar.  Bowel sounds are normal.  On palpation abdomen is soft.  She remains with mild tenderness in in left lower quadrant and epigastric region.  No organomegaly or masses. No LE edema or clubbing noted.  Labs/studies Results: Lab data from 05/14/2019 WBC 5.4 H&H 13.0 and 40.1 Platelet count 223K  Glucose 107 BUN 7 and creatinine 0.76 Serum sodium 143, potassium 4.3, chloride 103, CO2 29 Serum calcium 9.1 Bilirubin 0.4, AP 75, AST 15, ALT 10, total protein 6.2 and albumin 4.2. TSH 1.070  Assessment:  #1.  Chronic GERD.  Heartburn is well controlled with PPI and dietary measures.  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  She will stay on PPI as well as is working.  #2.  Esophageal dysphagia.  She has a history of Schatzki's ring and has undergone multiple dilations in the past.  Last dilation was in August 2018 and no structural abnormality was noted in esophagus and she did respond to dilation.  I believe she would benefit from esophageal manometry as barium study in May 2019 suggested esophageal dysmotility.  #3.  Nausea.  Nausea may be related to GERD gastroparesis or anxiety.  She is using medications less often.  #4.  Chronic diarrhea.  I believe diarrhea is due to IBS and perhaps dumping from partial gastrectomy.  Will continue symptomatic therapy.  #5.  History of iron deficiency anemia.  Her H&H lately has remained normal.  Iron deficiency anemia felt to be due to  impaired iron absorption resulting from chronic acid suppression.  #6.  Anxiety resulting from multiple medical problems.  She is on alprazolam.  #7.  History of vitamin D deficiency.  She has been on megadoses of vitamin D but her level has never returned to normal.  Parenteral therapy is cost prohibitive.  Plan:  Would recommend esophageal manometry to further evaluate her recurrent solid food dysphagia.  Patient would like to wait. New prescription given for alprazolam, pantoprazole, diphenoxylate, ondansetron, Mycolog-II cream and Phenergan. Patient will have CBC c-Met and vitamin D level prior to her next visit in 6 months.

## 2019-07-10 NOTE — Patient Instructions (Signed)
Notify if swallowing difficulty worsens ?

## 2019-10-05 ENCOUNTER — Other Ambulatory Visit: Payer: Self-pay | Admitting: Cardiovascular Disease

## 2019-12-24 ENCOUNTER — Encounter (INDEPENDENT_AMBULATORY_CARE_PROVIDER_SITE_OTHER): Payer: Self-pay

## 2019-12-31 ENCOUNTER — Ambulatory Visit (INDEPENDENT_AMBULATORY_CARE_PROVIDER_SITE_OTHER): Payer: BC Managed Care – PPO | Admitting: Internal Medicine

## 2019-12-31 ENCOUNTER — Other Ambulatory Visit: Payer: Self-pay

## 2019-12-31 ENCOUNTER — Encounter (INDEPENDENT_AMBULATORY_CARE_PROVIDER_SITE_OTHER): Payer: Self-pay | Admitting: Internal Medicine

## 2019-12-31 VITALS — BP 104/70 | HR 58 | Temp 97.2°F | Ht 64.0 in | Wt 105.4 lb

## 2019-12-31 DIAGNOSIS — E559 Vitamin D deficiency, unspecified: Secondary | ICD-10-CM | POA: Diagnosis not present

## 2019-12-31 DIAGNOSIS — K219 Gastro-esophageal reflux disease without esophagitis: Secondary | ICD-10-CM

## 2019-12-31 DIAGNOSIS — K529 Noninfective gastroenteritis and colitis, unspecified: Secondary | ICD-10-CM

## 2019-12-31 DIAGNOSIS — F411 Generalized anxiety disorder: Secondary | ICD-10-CM

## 2019-12-31 DIAGNOSIS — F419 Anxiety disorder, unspecified: Secondary | ICD-10-CM

## 2019-12-31 DIAGNOSIS — R7309 Other abnormal glucose: Secondary | ICD-10-CM | POA: Insufficient documentation

## 2019-12-31 DIAGNOSIS — R1013 Epigastric pain: Secondary | ICD-10-CM

## 2019-12-31 DIAGNOSIS — G8929 Other chronic pain: Secondary | ICD-10-CM

## 2019-12-31 MED ORDER — DIPHENOXYLATE-ATROPINE 2.5-0.025 MG PO TABS: ORAL_TABLET | ORAL | 2 refills | Status: DC

## 2019-12-31 MED ORDER — ALPRAZOLAM 0.25 MG PO TABS: 0.2500 mg | ORAL_TABLET | Freq: Three times a day (TID) | ORAL | 2 refills | Status: DC | PRN

## 2019-12-31 NOTE — Patient Instructions (Signed)
Keep symptom diary as to frequency of epigastric pain for next month. Physician will call the results of blood test when completed.

## 2019-12-31 NOTE — Progress Notes (Signed)
Presenting complaint;  Follow-up for GERD abdominal pain diarrhea and anxiety.  Database and subjective:  Patient is 64 year old Caucasian female with complicated GI history who has chronic GERD history of refractory gastric ulcer status post antrectomy with Billroth I reconstruction in November 2014 resulting in chronic diarrhea iron deficiency anemia as well as history of IBS anxiety refracturing vitamin D deficiency who is here for scheduled visit. She was last seen 6 months ago. She says she has been having 2-3 episodes of sharp severe pain in mid epigastric region generally occurring after meals.  These episodes are associated with nausea.  She had an episode last week when it lasted for 10 to 15 minutes.  She was planning to go to emergency room the pain eased.  She remains with chronic pain in epigastric region as well as LLQ.  She is using sucralfate on as-needed basis and rarely she uses more than 1 dose a day.  She remains with diarrhea.  On most days she has 2-3 bowel movements range is from 1 to 5/day.  She is taking Lomotil twice daily and cholestyramine on as-needed basis.  She feels heartburn is well controlled with PPI.  She has occasional dysphagia to solids.  She has undergone esophageal dilation most recently in August 2018.  No esophageal abnormality was noted.  She has had Schatzki's ring in the past.  She says her appetite is good.  She eats 3 meals and 1-2 snacks daily.  Her weight is down by 2 pounds since her last visit.  She is worried that she is unable to gain weight. She has been checking her glucose levels at home as recommended by Dr. Woody Seller.  On 11/17/2019 her glucose level was 207.  On another day it was 104 mg.  She is requesting hemoglobin A1c be checked with rest of her blood work. She also needs a refill on diphenoxylate and alprazolam.  Current Medications: Outpatient Encounter Medications as of 12/31/2019  Medication Sig  . ALPRAZolam (XANAX) 0.25 MG tablet Take 1  tablet (0.25 mg total) by mouth 3 (three) times daily as needed.  . Azelastine HCl 0.15 % SOLN Place 1 spray into the nose daily as needed (sinus congestion).   . Cholecalciferol (VITAMIN D3) 5000 UNITS CAPS Take 5,000 Units by mouth daily.   . cholestyramine (QUESTRAN) 4 g packet DISSOLVE & TAKE ONE POWDER BY MOUTH TWICE DAILY, TAKE 2 HOURS BEFORE OR AFTER TAKING OTHER MEDICINES  . Cyanocobalamin (VITAMIN B-12 PO) Take by mouth daily.  . diphenoxylate-atropine (LOMOTIL) 2.5-0.025 MG tablet TAKE 1 TABLET BY MOUTH 4 TIMES DAILY AS NEEDED FOR  DIARRHEA  OR  LOOSE  STOOLS  . ferrous gluconate (FERGON) 324 MG tablet Take 324 mg by mouth once a week. Tuesday & Friday  . FLUARIX QUADRIVALENT 0.5 ML injection Inject 0.5 mLs into the muscle.   . metoprolol tartrate (LOPRESSOR) 25 MG tablet Take 1/2 (one-half) tablet by mouth twice daily  . Multiple Vitamin (MULTIVITAMIN WITH MINERALS) TABS tablet Take 1 tablet by mouth daily.  Marland Kitchen nystatin-triamcinolone (MYCOLOG II) cream Apply 1 application topically 2 (two) times daily.  . ondansetron (ZOFRAN) 4 MG tablet Take 1 tablet (4 mg total) by mouth every 12 (twelve) hours as needed for nausea.  . pantoprazole (PROTONIX) 40 MG tablet Take 1 tablet (40 mg total) by mouth daily before breakfast.  . PROCTO-MED HC 2.5 % rectal cream Apply 1 application topically daily as needed for hemorrhoids.   . promethazine (PHENADOZ) 25 MG suppository Place  1 suppository (25 mg total) rectally 2 (two) times daily as needed for nausea or vomiting.  . Simethicone (PHAZYME) 180 MG CAPS Take 1 capsule (180 mg total) by mouth 2 (two) times daily as needed.  . sucralfate (CARAFATE) 1 g tablet Take 2 tablets (2 g total) by mouth at bedtime as needed.  . zinc gluconate 50 MG tablet Take 50 mg by mouth daily.   No facility-administered encounter medications on file as of 12/31/2019.     Objective: Blood pressure 104/70, pulse (!) 58, temperature (!) 97.2 F (36.2 C), temperature  source Temporal, height 5\' 4"  (1.626 m), weight 105 lb 6.4 oz (47.8 kg). Patient is alert and in no acute distress. She is wearing facial mask. Conjunctiva is pink. Sclera is nonicteric Oropharyngeal mucosa is normal. No neck masses or thyromegaly noted. Cardiac exam with regular rhythm normal S1 and S2. No murmur or gallop noted. Lungs are clear to auscultation. Abdomen is flat.  He has upper midline scar.  On palpation abdomen is soft she has some mild tenderness at LLQ and mild tenderness at epigastric region.  No organomegaly or masses. No LE edema or clubbing noted.  Labs/studies Results: Lab data from 07/17/2018 Hemoglobin A1c 5.8 Glucose 95 BUN 9 and creatinine 0.80 Serum sodium 142, potassium 4.4, chloride 103, CO2 25 Serum calcium 9.0 Bilirubin 0.5, AP 72, AST 16, ALT 9, total protein 5.9 albumin 4.1.  TSH was 1.010   Assessment:  #1.  Acute on chronic epigastric pain.  Frequent anastomotic ulcer remains a concern.  She did not have recurrent ulcer on EGD of August 2018.  If LFTs and lipase are normal she may need diagnostic esophagogastroduodenoscopy.  #2.  Chronic diarrhea.  She has history of IBS and diarrhea worsen after gastric surgery.  Prior testing has ruled out celiac disease.  Control is fair with symptomatic therapy.  #3.  Anxiety.  She is on alprazolam.  Medication will be refilled.    #4.  GERD.  She is doing well with therapy.  #5.  History of vitamin D deficiency.  She has been given megadoses of oral vitamin D but the level has remained low.  #6.  History of iron deficiency anemia.  Lately however H&H has been normal.  #7.  History of elevated blood glucose levels.  She has prediabetes.  I wonder if she is developing diabetes mellitus.   Plan:  New prescription given for alprazolam 0.25 mg 3 times a day 90 doses with 2 refills to be filled every 30 days and earlier. Prescription also given for diphenoxylate 1 tablet up to 4 times a day as needed 120  doses with 2 refills. Patient will go to the lab for CBC, comprehensive chemistry panel, serum lipase and hemoglobin A1c. Further recommendations to follow. Office visit in 6 months.

## 2020-01-01 LAB — LIPASE: Lipase: 46 U/L (ref 7–60)

## 2020-01-01 LAB — CBC
HCT: 36.7 % (ref 35.0–45.0)
Hemoglobin: 12 g/dL (ref 11.7–15.5)
MCH: 28.9 pg (ref 27.0–33.0)
MCHC: 32.7 g/dL (ref 32.0–36.0)
MCV: 88.4 fL (ref 80.0–100.0)
MPV: 10.3 fL (ref 7.5–12.5)
Platelets: 208 10*3/uL (ref 140–400)
RBC: 4.15 10*6/uL (ref 3.80–5.10)
RDW: 12.3 % (ref 11.0–15.0)
WBC: 4.6 10*3/uL (ref 3.8–10.8)

## 2020-01-01 LAB — HEMOGLOBIN A1C
Hgb A1c MFr Bld: 6.1 % of total Hgb — ABNORMAL HIGH (ref <5.7)
Mean Plasma Glucose: 128 (calc)
eAG (mmol/L): 7.1 (calc)

## 2020-01-01 LAB — COMPREHENSIVE METABOLIC PANEL
AG Ratio: 1.8 (calc) (ref 1.0–2.5)
ALT: 13 U/L (ref 6–29)
AST: 16 U/L (ref 10–35)
Albumin: 4 g/dL (ref 3.6–5.1)
Alkaline phosphatase (APISO): 79 U/L (ref 37–153)
BUN: 8 mg/dL (ref 7–25)
CO2: 29 mmol/L (ref 20–32)
Calcium: 9.1 mg/dL (ref 8.6–10.4)
Chloride: 103 mmol/L (ref 98–110)
Creat: 0.76 mg/dL (ref 0.50–0.99)
Globulin: 2.2 g/dL (calc) (ref 1.9–3.7)
Glucose, Bld: 99 mg/dL (ref 65–139)
Potassium: 4.2 mmol/L (ref 3.5–5.3)
Sodium: 140 mmol/L (ref 135–146)
Total Bilirubin: 0.4 mg/dL (ref 0.2–1.2)
Total Protein: 6.2 g/dL (ref 6.1–8.1)

## 2020-01-01 LAB — VITAMIN D 25 HYDROXY (VIT D DEFICIENCY, FRACTURES): Vit D, 25-Hydroxy: 17 ng/mL — ABNORMAL LOW (ref 30–100)

## 2020-01-07 ENCOUNTER — Telehealth (INDEPENDENT_AMBULATORY_CARE_PROVIDER_SITE_OTHER): Payer: Self-pay | Admitting: *Deleted

## 2020-01-07 NOTE — Telephone Encounter (Signed)
Labs mailed to patient.

## 2020-01-08 ENCOUNTER — Ambulatory Visit (INDEPENDENT_AMBULATORY_CARE_PROVIDER_SITE_OTHER): Payer: BC Managed Care – PPO | Admitting: Internal Medicine

## 2020-03-03 ENCOUNTER — Ambulatory Visit: Payer: BC Managed Care – PPO | Admitting: Family Medicine

## 2020-03-31 ENCOUNTER — Ambulatory Visit: Payer: BC Managed Care – PPO | Admitting: Cardiology

## 2020-05-12 ENCOUNTER — Ambulatory Visit: Payer: BC Managed Care – PPO | Admitting: Cardiovascular Disease

## 2020-06-12 ENCOUNTER — Ambulatory Visit: Payer: BC Managed Care – PPO | Admitting: Cardiology

## 2020-07-01 ENCOUNTER — Encounter (INDEPENDENT_AMBULATORY_CARE_PROVIDER_SITE_OTHER): Payer: Self-pay | Admitting: Internal Medicine

## 2020-07-01 ENCOUNTER — Ambulatory Visit (INDEPENDENT_AMBULATORY_CARE_PROVIDER_SITE_OTHER): Payer: BC Managed Care – PPO | Admitting: Internal Medicine

## 2020-07-01 ENCOUNTER — Other Ambulatory Visit: Payer: Self-pay

## 2020-07-01 VITALS — BP 122/86 | HR 73

## 2020-07-01 DIAGNOSIS — K529 Noninfective gastroenteritis and colitis, unspecified: Secondary | ICD-10-CM | POA: Diagnosis not present

## 2020-07-01 DIAGNOSIS — R1319 Other dysphagia: Secondary | ICD-10-CM

## 2020-07-01 DIAGNOSIS — F419 Anxiety disorder, unspecified: Secondary | ICD-10-CM | POA: Diagnosis not present

## 2020-07-01 DIAGNOSIS — R110 Nausea: Secondary | ICD-10-CM

## 2020-07-01 DIAGNOSIS — K219 Gastro-esophageal reflux disease without esophagitis: Secondary | ICD-10-CM

## 2020-07-01 DIAGNOSIS — F411 Generalized anxiety disorder: Secondary | ICD-10-CM

## 2020-07-01 DIAGNOSIS — E559 Vitamin D deficiency, unspecified: Secondary | ICD-10-CM

## 2020-07-01 MED ORDER — PROMETHAZINE HCL 25 MG PO TABS: 25.0000 mg | ORAL_TABLET | Freq: Two times a day (BID) | ORAL | 0 refills | Status: DC | PRN

## 2020-07-01 MED ORDER — DIPHENOXYLATE-ATROPINE 2.5-0.025 MG PO TABS: 1.0000 | ORAL_TABLET | Freq: Four times a day (QID) | ORAL | 2 refills | Status: DC | PRN

## 2020-07-01 MED ORDER — ALPRAZOLAM 0.25 MG PO TABS: 0.2500 mg | ORAL_TABLET | Freq: Three times a day (TID) | ORAL | 2 refills | Status: DC | PRN

## 2020-07-01 MED ORDER — PANTOPRAZOLE SODIUM 40 MG PO TBEC: 40.0000 mg | DELAYED_RELEASE_TABLET | Freq: Every day | ORAL | 3 refills | Status: DC

## 2020-07-01 MED ORDER — PROMETHAZINE HCL 25 MG RE SUPP: 25.0000 mg | Freq: Two times a day (BID) | RECTAL | 1 refills | Status: DC | PRN

## 2020-07-01 NOTE — Progress Notes (Addendum)
Presenting complaint;  Follow-up for multiple problems.  Patient requesting multiple refills.  Database and subjective:  Patient is 64 year old Caucasian female with multiple medical problems including chronic GERD history of refractory peptic ulcer disease leading to surgery chronic diarrhea vitamin D deficiency anxiety as well as history of iron deficiency anemia who is here for scheduled visit.  Iron deficiency anemia has corrected with iron replacement. She was last seen 6 months ago. She remains with diarrhea.  She has anywhere from 3-4 bowel movements per day.  Stool consistency varies from soft to loose stool.  She has had 2 accidents since her last visit and both of these occurred at night while she was sleeping.  She is not taking Questran anymore.  She is taking Lomotil 3 times a day.  She says pantoprazole is working.  Heartburn is well controlled with therapy.  She states she has been having an episode of dysphagia with solids but once a week.  She is kept a record since her last visit.  She had total of 22 episodes of dysphagia in the last 6 months. She says her appetite is normal.  She has gained 2 pounds since her last visit.  She also has been keeping record of blood glucose levels and it has been as high as 215.  It was on 06/18/2020.  She had 3 more readings above 200.  She is scheduled to have A1c by Dr. Woody Seller. She is also using eyedrops for elevated intraocular pressure.  She does not remember the name. He wants to know when her next colonoscopy would be.  Her last exam was in June 2018.  Current Medications: Outpatient Encounter Medications as of 07/01/2020  Medication Sig  . ALPRAZolam (XANAX) 0.25 MG tablet Take 1 tablet (0.25 mg total) by mouth 3 (three) times daily as needed.  . Azelastine HCl 0.15 % SOLN Place 1 spray into the nose daily as needed (sinus congestion).   . Cholecalciferol (VITAMIN D3) 5000 UNITS CAPS Take 5,000 Units by mouth daily.   . cholestyramine  (QUESTRAN) 4 g packet DISSOLVE & TAKE ONE POWDER BY MOUTH TWICE DAILY, TAKE 2 HOURS BEFORE OR AFTER TAKING OTHER MEDICINES  . Cyanocobalamin (VITAMIN B-12 PO) Take by mouth daily.  . diphenoxylate-atropine (LOMOTIL) 2.5-0.025 MG tablet TAKE 1 TABLET BY MOUTH 4 TIMES DAILY AS NEEDED FOR  DIARRHEA  OR  LOOSE  STOOLS  . ferrous gluconate (FERGON) 324 MG tablet Take 324 mg by mouth once a week. Tuesday & Friday  . FLUARIX QUADRIVALENT 0.5 ML injection Inject 0.5 mLs into the muscle.   . metoprolol tartrate (LOPRESSOR) 25 MG tablet Take 1/2 (one-half) tablet by mouth twice daily  . Multiple Vitamin (MULTIVITAMIN WITH MINERALS) TABS tablet Take 1 tablet by mouth daily.  . ondansetron (ZOFRAN) 4 MG tablet Take 1 tablet (4 mg total) by mouth every 12 (twelve) hours as needed for nausea.  . pantoprazole (PROTONIX) 40 MG tablet Take 1 tablet (40 mg total) by mouth daily before breakfast.  . PROCTO-MED HC 2.5 % rectal cream Apply 1 application topically daily as needed for hemorrhoids.   . promethazine (PHENADOZ) 25 MG suppository Place 1 suppository (25 mg total) rectally 2 (two) times daily as needed for nausea or vomiting.  . Simethicone (PHAZYME) 180 MG CAPS Take 1 capsule (180 mg total) by mouth 2 (two) times daily as needed.  . sucralfate (CARAFATE) 1 g tablet Take 2 tablets (2 g total) by mouth at bedtime as needed.  . zinc gluconate  50 MG tablet Take 50 mg by mouth daily.   No facility-administered encounter medications on file as of 07/01/2020.     Objective: Blood pressure 122/86, pulse 73.  Temperature not checked per patient's request.  She declined to remove her mask. Weight 107.2 pounds.  She is 5 feet 4 inches tall. Patient is wearing mask. Conjunctiva is pink. Sclera is nonicteric Oropharyngeal mucosa is normal. No neck masses or thyromegaly noted. Cardiac exam with regular rhythm normal S1 and S2. No murmur or gallop noted. Lungs are clear to auscultation. Abdomen is flat.  Upper  midline scar noted.  Bowel sounds are hyperactive.  She has mild generalized tenderness without guarding or rebound.  No organomegaly or masses. No LE edema or clubbing noted.  Labs/studies Results:  CBC Latest Ref Rng & Units 12/31/2019 01/10/2018 07/12/2017  WBC 3.8 - 10.8 Thousand/uL 4.6 5.1 6.0  Hemoglobin 11.7 - 15.5 g/dL 12.0 12.4 12.7  Hematocrit 35 - 45 % 36.7 36.9 37.7  Platelets 140 - 400 Thousand/uL 208 199 181    CMP Latest Ref Rng & Units 12/31/2019 02/28/2017 01/04/2017  Glucose 65 - 139 mg/dL 99 106(H) 93  BUN 7 - 25 mg/dL 8 11 9   Creatinine 0.50 - 0.99 mg/dL 0.76 0.77 0.74  Sodium 135 - 146 mmol/L 140 139 142  Potassium 3.5 - 5.3 mmol/L 4.2 4.0 4.1  Chloride 98 - 110 mmol/L 103 101 103  CO2 20 - 32 mmol/L 29 30 25   Calcium 8.6 - 10.4 mg/dL 9.1 9.4 9.2  Total Protein 6.1 - 8.1 g/dL 6.2 - 5.9(L)  Total Bilirubin 0.2 - 1.2 mg/dL 0.4 - 0.4  Alkaline Phos 33 - 130 U/L - - 55  AST 10 - 35 U/L 16 - 17  ALT 6 - 29 U/L 13 - 12    Hepatic Function Latest Ref Rng & Units 12/31/2019 01/04/2017 11/05/2013  Total Protein 6.1 - 8.1 g/dL 6.2 5.9(L) 6.3  Albumin 3.6 - 5.1 g/dL - 3.9 4.1  AST 10 - 35 U/L 16 17 16   ALT 6 - 29 U/L 13 12 8   Alk Phosphatase 33 - 130 U/L - 55 82  Total Bilirubin 0.2 - 1.2 mg/dL 0.4 0.4 0.4    Lab data from 06/24/2020  WBC 7.5, H&H 12.7 and 39.5 and platelet count 283K.  TSH 1.030  Glucose 103 BUN 9 and creatinine 0.75 Serum sodium 141, potassium 4.9, chloride 101, CO2 25 Serum calcium 9.6 Bilirubin 0.3, AP 109, AST 18, ALT 15, total protein 6.6 and albumin 4.4.   Assessment:  #1.  Chronic diarrhea.  Diarrhea is felt to be multifactorial.  She has an element of dumping syndrome as well as IBS.  She has undergone extensive evaluation in the past and malabsorption has been ruled out.  She would benefit from going back on cholestyramine that she can take it night.  #2.  Chronic GERD.  Heartburn is well controlled with therapy.  She will continue  PPI.  #3.  Esophageal dysphagia.  She is having both one episode of dysphagia every week.  Last esophageal dilation was 3 years ago.  She has not had any episode of food impaction.  Esophageal dilation to be delayed until pandemic over her dysphagia worsens.  #4.  Vitamin D deficiency.  She has had chronically low levels.  She has not even responded to megadoses of vitamin D supplements by mouth.  Parenteral therapy was considered but cost was prohibitive.  #5.  Anxiety disorder resulting from multiple  chronic illnesses.  She remains on alprazolam.  #6.  She is average risk for colorectal carcinoma.  She is up-to-date on screening.  Last colonoscopy was in June 2018.  Next screening exam due in 2028.  #7.  Chronic/intermittent nausea without vomiting.  Nausea is most likely multifactorial.  #8.  Low body weight.  This has been a chronic problem.  Her BMI is 18.5.  Plan:  New prescription given for alprazolam for 1 month with 2 refills, for Lomotil/diphenoxylate for 1 month with 2 refills. New prescription also given for pantoprazole 40 mg p.o. every morning 90 doses with 3 refills. New prescription also given for Phenergan tablets and suppositories. She will have vitamin D level with next blood draw. Patient will call if dysphagia worsens or if she has an episode of food impaction in which case we will proceed with esophagogastroduodenoscopy. Office visit in 6 months.

## 2020-07-01 NOTE — Patient Instructions (Signed)
Vitamin D level to be checked with next blood work. Please try taking cholestyramine/Questran 2 g every evening she will it prevents nocturnal l fecal seepage. Please call office if swallowing difficulty worsens in which case we will proceed with esophagogastroduodenoscopy with esophageal dilation.

## 2020-07-02 ENCOUNTER — Ambulatory Visit (INDEPENDENT_AMBULATORY_CARE_PROVIDER_SITE_OTHER): Payer: BC Managed Care – PPO | Admitting: Cardiology

## 2020-07-02 ENCOUNTER — Encounter: Payer: Self-pay | Admitting: Cardiology

## 2020-07-02 ENCOUNTER — Encounter: Payer: Self-pay | Admitting: *Deleted

## 2020-07-02 VITALS — BP 115/70 | HR 58 | Ht 64.0 in | Wt 107.8 lb

## 2020-07-02 DIAGNOSIS — R002 Palpitations: Secondary | ICD-10-CM

## 2020-07-02 DIAGNOSIS — I471 Supraventricular tachycardia: Secondary | ICD-10-CM | POA: Diagnosis not present

## 2020-07-02 MED ORDER — METOPROLOL TARTRATE 25 MG PO TABS
25.0000 mg | ORAL_TABLET | Freq: Two times a day (BID) | ORAL | 3 refills | Status: DC
Start: 2020-07-02 — End: 2021-08-10

## 2020-07-02 NOTE — Addendum Note (Signed)
Addended by: Lesle Chris on: 07/02/2020 09:55 AM   Modules accepted: Orders

## 2020-07-02 NOTE — Patient Instructions (Signed)
Medication Instructions:  Increase Lopressor to 25mg twice a day   Continue all other medications.     Labwork: none  Testing/Procedures: none  Follow-Up: 6 months   Any Other Special Instructions Will Be Listed Below (If Applicable).   If you need a refill on your cardiac medications before your next appointment, please call your pharmacy.  

## 2020-07-02 NOTE — Progress Notes (Signed)
Clinical Summary Lindsay Lawrence is a 64 y.o.female last seen by Dr Purvis Sheffield, this is our first visit together.   1. PSVT - has been on lopressor - some recent symptoms. Occurring daily, lasts just few minutes - compliant with meds - limiting caffeine, no EtOH.  - prior issues with low bp's but none recently.    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 Anemia   . Anxiety   . Chest pain    Nuclear, normal, 2010,  //   hospital August, 2013 no evidence of injury  . Ejection fraction    EF 65%, echo, April 19, 2012  . Gastric ulcer   . GERD (gastroesophageal reflux disease)   . History of palpitations   . Irritable bowel syndrome   . Mitral regurgitation    Mild, echo, August, 2013  . Palpitations   . Reactive airway disease    Pulmonary function studies from June, 2013, show the patient does have significant response to bronchodilators.  . Syncope    Vasovagal     Allergies  Allergen Reactions  . Aspirin Other (See Comments)    Ulcer, Reflux  . Codeine Nausea And Vomiting    Speeds heart up, dizziness  . Fentanyl Nausea And Vomiting    Confusion, weakness  . Nsaids Other (See Comments)    reflux, ulcer  . Propoxyphene N-Acetaminophen Nausea And Vomiting    Speeds heart up, dizziness     Current Outpatient Medications  Medication Sig Dispense Refill  . ALPRAZolam (XANAX) 0.25 MG tablet Take 1 tablet (0.25 mg total) by mouth 3 (three) times daily as needed. 90 tablet 2  . Azelastine HCl 0.15 % SOLN Place 1 spray into the nose daily as needed (sinus congestion).     . Cholecalciferol (VITAMIN D3) 5000 UNITS CAPS Take 5,000 Units by mouth daily.     . cholestyramine (QUESTRAN) 4 g packet DISSOLVE & TAKE ONE POWDER BY MOUTH TWICE DAILY, TAKE 2 HOURS BEFORE OR AFTER TAKING OTHER MEDICINES 180 packet 2  . Cyanocobalamin (VITAMIN B-12 PO) Take by mouth daily.    . diphenoxylate-atropine (LOMOTIL) 2.5-0.025 MG tablet Take 1  tablet by mouth 4 (four) times daily as needed for diarrhea or loose stools. TAKE 1 TABLET BY MOUTH 4 TIMES DAILY AS NEEDED FOR  DIARRHEA  OR  LOOSE  STOOLS 120 tablet 2  . ferrous gluconate (FERGON) 324 MG tablet Take 324 mg by mouth once a week. Tuesday & Friday    . FLUARIX QUADRIVALENT 0.5 ML injection Inject 0.5 mLs into the muscle.     . metoprolol tartrate (LOPRESSOR) 25 MG tablet Take 1/2 (one-half) tablet by mouth twice daily 90 tablet 2  . Multiple Vitamin (MULTIVITAMIN WITH MINERALS) TABS tablet Take 1 tablet by mouth daily.    . ondansetron (ZOFRAN) 4 MG tablet Take 1 tablet (4 mg total) by mouth every 12 (twelve) hours as needed for nausea. 20 tablet 1  . pantoprazole (PROTONIX) 40 MG tablet Take 1 tablet (40 mg total) by mouth daily before breakfast. 90 tablet 3  . PROCTO-MED HC 2.5 % rectal cream Apply 1 application topically daily as needed for hemorrhoids.     . promethazine (PHENADOZ) 25 MG suppository Place 1 suppository (25 mg total) rectally 2 (two) times daily as needed for nausea or vomiting. 12 each 1  . promethazine (PHENERGAN) 25 MG tablet Take 1 tablet (25 mg total) by mouth  2 (two) times daily as needed for nausea or vomiting. 20 tablet 0  . Simethicone (PHAZYME) 180 MG CAPS Take 1 capsule (180 mg total) by mouth 2 (two) times daily as needed.  0  . sucralfate (CARAFATE) 1 g tablet Take 2 tablets (2 g total) by mouth at bedtime as needed. 60 tablet 5  . zinc gluconate 50 MG tablet Take 50 mg by mouth daily.     No current facility-administered medications for this visit.     Past Surgical History:  Procedure Laterality Date  . ABDOMINAL HYSTERECTOMY    . BIOPSY  08/04/2016   Procedure: BIOPSY;  Surgeon: Malissa Hippo, MD;  [facility]: [facility];  Service: Endoscopy;;  gastric  . CHOLECYSTECTOMY    . COLONOSCOPY    . COLONOSCOPY WITH PROPOFOL N/A 03/04/2017   Procedure: COLONOSCOPY WITH PROPOFOL;  Surgeon: Malissa Hippo, MD;  Location: [facility];   Service: Endoscopy;  Laterality: N/A;  10:10  . ESOPHAGEAL DILATION  06/07/2014   Procedure: ESOPHAGEAL DILATION;  Surgeon: Malissa Hippo, MD;  [facility]: [facility];  Service: Endoscopy;;  . ESOPHAGEAL DILATION N/A 08/27/2015   Procedure: ESOPHAGEAL DILATION;  Surgeon: Malissa Hippo, MD;  [facility]: [facility];  Service: Endoscopy;  Laterality: N/A;  . ESOPHAGEAL DILATION  08/04/2016   Procedure: ESOPHAGEAL DILATION;  Surgeon: Malissa Hippo, MD;  [facility]: [facility];  Service: Endoscopy;;  . ESOPHAGEAL DILATION N/A 04/13/2017   Procedure: ESOPHAGEAL DILATION;  Surgeon: Malissa Hippo, MD;  [facility]: [facility];  Service: Endoscopy;  Laterality: N/A;  . ESOPHAGOGASTRODUODENOSCOPY  02/04/2012   Procedure: ESOPHAGOGASTRODUODENOSCOPY (EGD);  Surgeon: Malissa Hippo, MD;  [facility]: [facility];  Service: Endoscopy;  Laterality: N/A;  320  . ESOPHAGOGASTRODUODENOSCOPY  06/02/2012   Procedure: ESOPHAGOGASTRODUODENOSCOPY (EGD);  Surgeon: Malissa Hippo, MD;  [facility]: [facility];  Service: Endoscopy;  Laterality: N/A;  1050  . ESOPHAGOGASTRODUODENOSCOPY N/A 06/07/2014   Procedure: ESOPHAGOGASTRODUODENOSCOPY (EGD);  Surgeon: Malissa Hippo, MD;  [facility]: [facility];  Service: Endoscopy;  Laterality: N/A;  1035-rescheduled 9/25 @ 8:30 Ann to notify pt  . ESOPHAGOGASTRODUODENOSCOPY N/A 02/17/2015   Procedure: ESOPHAGOGASTRODUODENOSCOPY (EGD);  Surgeon: Malissa Hippo, MD;  [facility];  Service: Endoscopy;  Laterality: N/A;  730  . ESOPHAGOGASTRODUODENOSCOPY N/A 08/27/2015   Procedure: ESOPHAGOGASTRODUODENOSCOPY (EGD);  Surgeon: Malissa Hippo, MD;  [facility];  Service: Endoscopy;  Laterality: N/A;  1155  . ESOPHAGOGASTRODUODENOSCOPY N/A 08/04/2016   Procedure: ESOPHAGOGASTRODUODENOSCOPY (EGD);  Surgeon: Malissa Hippo, MD;  [facility];  Service: Endoscopy;  Laterality: N/A;  225  . ESOPHAGOGASTRODUODENOSCOPY N/A 04/13/2017   Procedure:  ESOPHAGOGASTRODUODENOSCOPY (EGD);  Surgeon: Malissa Hippo, MD;  [facility];  Service: Endoscopy;  Laterality: N/A;  . ESOPHAGOGASTRODUODENOSCOPY (EGD) WITH ESOPHAGEAL DILATION N/A 05/31/2013   Procedure: ESOPHAGOGASTRODUODENOSCOPY (EGD) WITH ESOPHAGEAL DILATION;  Surgeon: Malissa Hippo, MD;  [facility]: [facility];  Service: Endoscopy;  Laterality: N/A;  . FLEXIBLE SIGMOIDOSCOPY N/A 05/31/2013   Procedure: FLEXIBLE SIGMOIDOSCOPY;  Surgeon: Malissa Hippo, MD;  [facility];  Service: Endoscopy;  Laterality: N/A;  250  . FLEXIBLE SIGMOIDOSCOPY N/A 06/07/2014   Procedure: FLEXIBLE SIGMOIDOSCOPY;  Surgeon: Malissa Hippo, MD;  [facility];  Service: Endoscopy;  Laterality: N/A;  . GIVENS CAPSULE STUDY  05/03/2011   Procedure: GIVENS CAPSULE STUDY;  Surgeon: Malissa Hippo, MD;  [facility]: [facility];  Service:  Endoscopy;  Laterality: N/A;  7:30 am  . STOMACH SURGERY    . UPPER GASTROINTESTINAL ENDOSCOPY       Allergies  Allergen Reactions  . Aspirin Other (See Comments)    Ulcer, Reflux  . Codeine Nausea And Vomiting    Speeds heart up, dizziness  . Fentanyl Nausea And Vomiting    Confusion, weakness  . Nsaids Other (See Comments)    reflux, ulcer  . Propoxyphene N-Acetaminophen Nausea And Vomiting    Speeds heart up, dizziness      Family History  Problem Relation Age of Onset  . Healthy Mother   . Heart attack Father   . Healthy Sister   . Colon cancer Neg Hx      Social History Lindsay Lawrence reports that she has never smoked. She has never used smokeless tobacco. Lindsay Lawrence reports no history of alcohol use.   Review of Systems CONSTITUTIONAL: No weight loss, fever, chills, weakness or fatigue.  HEENT: Eyes: No visual loss, blurred vision, double vision or yellow sclerae.No hearing loss, sneezing, congestion, runny nose or sore throat.  SKIN: No rash or itching.  CARDIOVASCULAR: per hpi RESPIRATORY: No shortness of breath, cough  or sputum.  GASTROINTESTINAL: No anorexia, nausea, vomiting or diarrhea. No abdominal pain or blood.  GENITOURINARY: No burning on urination, no polyuria NEUROLOGICAL: No headache, dizziness, syncope, paralysis, ataxia, numbness or tingling in the extremities. No change in bowel or bladder control.  MUSCULOSKELETAL: No muscle, back pain, joint pain or stiffness.  LYMPHATICS: No enlarged nodes. No history of splenectomy.  PSYCHIATRIC: No history of depression or anxiety.  ENDOCRINOLOGIC: No reports of sweating, cold or heat intolerance. No polyuria or polydipsia.  Marland Kitchen   Physical Examination Today's Vitals   07/02/20 0916  BP: 115/70  Pulse: (!) 58  SpO2: 99%  Weight: 107 lb 12.8 oz (48.9 kg)  Height: 5\' 4"  (1.626 m)   Body mass index is 18.5 kg/m.  Gen: resting comfortably, no acute distress HEENT: no scleral icterus, pupils equal round and reactive, no palptable cervical adenopathy,  CV: RRR, no m/r/g, no jvd Resp: Clear to auscultation bilaterally GI: abdomen is soft, non-tender, non-distended, normal bowel sounds, no hepatosplenomegaly MSK: extremities are warm, no edema.  Skin: warm, no rash Neuro:  no focal deficits Psych: appropriate affect      Assessment and Plan  1. PSVT/Palpitations - recent symptoms. We discussed a repeat monitor however she is not in favor - av nodal agent dosing has been limited by soft bp's, most recently bp's have looked fine. Will increase lopressor to 25mg  bid - EKG todays shows NSR   F/u 6 months      , M.D., F.A.C.C.

## 2020-07-14 ENCOUNTER — Ambulatory Visit: Payer: BC Managed Care – PPO | Admitting: Cardiovascular Disease

## 2020-08-25 ENCOUNTER — Telehealth (INDEPENDENT_AMBULATORY_CARE_PROVIDER_SITE_OTHER): Payer: Self-pay | Admitting: *Deleted

## 2020-08-25 NOTE — Telephone Encounter (Signed)
Patient called and left a message on the voicemail that she was not going to be having her lab work drawn due to the COVID. Concerned that she may come in contact with someone who may have it. She will hold to the order and have it done a little later. She will keep [place] formed.  This is the Vitamin D Level Lab work that the patient is referring too. Dr. Karilyn Cota will be made aware.

## 2020-09-17 ENCOUNTER — Ambulatory Visit: Payer: BC Managed Care – PPO | Admitting: Cardiology

## 2020-10-03 ENCOUNTER — Other Ambulatory Visit (INDEPENDENT_AMBULATORY_CARE_PROVIDER_SITE_OTHER): Payer: Self-pay | Admitting: Internal Medicine

## 2020-10-03 DIAGNOSIS — F411 Generalized anxiety disorder: Secondary | ICD-10-CM

## 2020-10-06 ENCOUNTER — Other Ambulatory Visit (INDEPENDENT_AMBULATORY_CARE_PROVIDER_SITE_OTHER): Payer: Self-pay | Admitting: Internal Medicine

## 2020-10-06 DIAGNOSIS — F411 Generalized anxiety disorder: Secondary | ICD-10-CM

## 2020-10-06 MED ORDER — ALPRAZOLAM 0.25 MG PO TABS: 0.2500 mg | ORAL_TABLET | Freq: Three times a day (TID) | ORAL | 2 refills | Status: DC | PRN

## 2020-10-06 NOTE — Telephone Encounter (Signed)
Alprazolam 0.25 mg 3 times daily as needed 90 doses with 2 refills

## 2020-10-06 NOTE — Telephone Encounter (Signed)
This prescription was called to [facility] in [place]. It was given to the pharmacist Arturo Morton. He will make the patient aware when it is ready.

## 2020-10-06 NOTE — Telephone Encounter (Signed)
Alprazolam prescription refilled. Doses 0.25 mg 3 times a day as needed.  90 doses

## 2020-11-14 ENCOUNTER — Encounter (INDEPENDENT_AMBULATORY_CARE_PROVIDER_SITE_OTHER): Payer: Self-pay

## 2020-12-30 ENCOUNTER — Other Ambulatory Visit: Payer: Self-pay

## 2020-12-30 ENCOUNTER — Ambulatory Visit (INDEPENDENT_AMBULATORY_CARE_PROVIDER_SITE_OTHER): Payer: Medicare Other | Admitting: Internal Medicine

## 2020-12-30 ENCOUNTER — Encounter (INDEPENDENT_AMBULATORY_CARE_PROVIDER_SITE_OTHER): Payer: Self-pay | Admitting: Internal Medicine

## 2020-12-30 VITALS — BP 108/76 | HR 79 | Temp 98.0°F | Ht 64.0 in | Wt 108.0 lb

## 2020-12-30 DIAGNOSIS — R1013 Epigastric pain: Secondary | ICD-10-CM | POA: Diagnosis not present

## 2020-12-30 DIAGNOSIS — K529 Noninfective gastroenteritis and colitis, unspecified: Secondary | ICD-10-CM | POA: Diagnosis not present

## 2020-12-30 DIAGNOSIS — F411 Generalized anxiety disorder: Secondary | ICD-10-CM

## 2020-12-30 DIAGNOSIS — F419 Anxiety disorder, unspecified: Secondary | ICD-10-CM

## 2020-12-30 DIAGNOSIS — K219 Gastro-esophageal reflux disease without esophagitis: Secondary | ICD-10-CM

## 2020-12-30 DIAGNOSIS — G8929 Other chronic pain: Secondary | ICD-10-CM

## 2020-12-30 LAB — VITAMIN D 25 HYDROXY (VIT D DEFICIENCY, FRACTURES): Vit D, 25-Hydroxy: 23 ng/mL — ABNORMAL LOW (ref 30–100)

## 2020-12-30 MED ORDER — ALPRAZOLAM 0.25 MG PO TABS: 0.2500 mg | ORAL_TABLET | Freq: Three times a day (TID) | ORAL | 0 refills | Status: DC | PRN

## 2020-12-30 MED ORDER — PANTOPRAZOLE SODIUM 40 MG PO TBEC
40.0000 mg | DELAYED_RELEASE_TABLET | Freq: Every day | ORAL | 3 refills | Status: DC
Start: 2020-12-30 — End: 2021-07-07

## 2020-12-30 NOTE — Progress Notes (Signed)
Presenting complaint;  Follow for chronic GERD abdominal pain and diarrhea.  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  She was last seen 6 months ago. Patient states she has not had vitamin D level checked which was requested at the time of her last visit.  She states she just has been afraid to go to the lab because of [place] pandemic. She says heartburn is well controlled.  PPI is working.  She needs a prescription.  She continues to experience dysphagia.  She says she has an episode almost every week but she has not had an episode of food impaction.  She had had her esophagus dilated in the past most recently in August 2018.  She continues to experience epigastric and left lower quadrant abdominal pain which seem to occur after meals.  She has 2-4 bowel movements per day.  Most of her stools are mushy to loose.  She denies melena or rectal bleeding.  She takes cholestyramine couple of times in a month.  She tries not to take too much antidiarrheal as it results in nausea and she feels her stomach is not emptying. Her appetite is fair.  She has gained 3 pounds since her last visit. She has to take nausea medicine at least 2-3 times in a month. She also needs a refill on her alprazolam which she takes for anxiety and also helps with the abdominal symptoms.  Current Medications: Outpatient Encounter Medications as of 12/30/2020  Medication Sig  . ALPRAZolam (XANAX) 0.25 MG tablet Take 1 tablet by mouth three times daily as needed  . Azelastine HCl 0.15 % SOLN Place 1 spray into the nose daily as needed (sinus congestion).   . Cholecalciferol (VITAMIN D3) 5000  UNITS CAPS Take 5,000 Units by mouth daily.   . cholestyramine (QUESTRAN) 4 g packet DISSOLVE & TAKE ONE POWDER BY MOUTH TWICE DAILY, TAKE 2 HOURS BEFORE OR AFTER TAKING OTHER MEDICINES  . Cyanocobalamin (VITAMIN B-12 PO) Take by mouth daily.  . diphenoxylate-atropine (LOMOTIL) 2.5-0.025 MG tablet Take 1 tablet by mouth 4 (four) times daily as needed for diarrhea or loose stools. TAKE 1 TABLET BY MOUTH 4 TIMES DAILY AS NEEDED FOR  DIARRHEA  OR  LOOSE  STOOLS  . ferrous gluconate (FERGON) 324 MG tablet Take 324 mg by mouth once a week. Tuesday & Friday  . latanoprost (XALATAN) 0.005 % ophthalmic solution Place 1 drop into both eyes at bedtime.  . metoprolol tartrate (LOPRESSOR) 25 MG tablet Take 1 tablet (25 mg total) by mouth 2 (two) times daily.  . Multiple Vitamin (MULTIVITAMIN WITH MINERALS) TABS tablet Take 1 tablet by mouth daily.  . ondansetron (ZOFRAN) 4 MG tablet Take 1 tablet (4 mg total) by mouth every 12 (twelve) hours as needed for nausea.  . pantoprazole (PROTONIX) 40 MG tablet Take 1 tablet (40 mg total) by mouth daily before breakfast.  . PROCTO-MED HC 2.5 % rectal cream Apply 1 application topically daily as needed for hemorrhoids.   . promethazine (PHENADOZ) 25 MG suppository Place 1 suppository (25 mg total) rectally 2 (two) times daily as needed for nausea or vomiting.  . promethazine (PHENERGAN) 25 MG tablet Take 1 tablet (25 mg total)  by mouth 2 (two) times daily as needed for nausea or vomiting.  . Simethicone (PHAZYME) 180 MG CAPS Take 1 capsule (180 mg total) by mouth 2 (two) times daily as needed.  . sucralfate (CARAFATE) 1 g tablet Take 2 tablets (2 g total) by mouth at bedtime as needed.  . zinc gluconate 50 MG tablet Take 50 mg by mouth daily.  . [DISCONTINUED] ALPRAZolam (XANAX) 0.25 MG tablet Take 1 tablet (0.25 mg total) by mouth 3 (three) times daily as needed.  . [DISCONTINUED] FLUARIX QUADRIVALENT 0.5 ML injection Inject 0.5 mLs into the muscle.    No  facility-administered encounter medications on file as of 12/30/2020.     Objective: Blood pressure 108/76, pulse 79, temperature 98 F (36.7 C), temperature source Oral, height 5' 4"  (1.626 m), weight 108 lb (49 kg). Patient is alert and in no acute distress. She is wearing a mask. Conjunctiva is pink. Sclera is nonicteric Oropharyngeal mucosa is normal. No neck masses or thyromegaly noted. Cardiac exam with regular rhythm normal S1 and S2. No murmur or gallop noted. Lungs are clear to auscultation. Abdomen is flat.  She has upper midline scar.  On palpation abdomen is soft.  She remains with tenderness in epigastrium and left lower quadrant.  Tenderness is mild without any guarding.  No organomegaly or masses. No LE edema or clubbing noted.  Labs/studies Results:  CBC Latest Ref Rng & Units 12/31/2019 01/10/2018 07/12/2017  WBC 3.8 - 10.8 Thousand/uL 4.6 5.1 6.0  Hemoglobin 11.7 - 15.5 g/dL 12.0 12.4 12.7  Hematocrit 35.0 - 45.0 % 36.7 36.9 37.7  Platelets 140 - 400 Thousand/uL 208 199 181    CMP Latest Ref Rng & Units 12/31/2019 02/28/2017 01/04/2017  Glucose 65 - 139 mg/dL 99 106(H) 93  BUN 7 - 25 mg/dL 8 11 9   Creatinine 0.50 - 0.99 mg/dL 0.76 0.77 0.74  Sodium 135 - 146 mmol/L 140 139 142  Potassium 3.5 - 5.3 mmol/L 4.2 4.0 4.1  Chloride 98 - 110 mmol/L 103 101 103  CO2 20 - 32 mmol/L 29 30 25   Calcium 8.6 - 10.4 mg/dL 9.1 9.4 9.2  Total Protein 6.1 - 8.1 g/dL 6.2 - 5.9(L)  Total Bilirubin 0.2 - 1.2 mg/dL 0.4 - 0.4  Alkaline Phos 33 - 130 U/L - - 55  AST 10 - 35 U/L 16 - 17  ALT 6 - 29 U/L 13 - 12    Hepatic Function Latest Ref Rng & Units 12/31/2019 01/04/2017 11/05/2013  Total Protein 6.1 - 8.1 g/dL 6.2 5.9(L) 6.3  Albumin 3.6 - 5.1 g/dL - 3.9 4.1  AST 10 - 35 U/L 16 17 16   ALT 6 - 29 U/L 13 12 8   Alk Phosphatase 33 - 130 U/L - 55 82  Total Bilirubin 0.2 - 1.2 mg/dL 0.4 0.4 0.4      Assessment:  #1.  Chronic GERD.  She is doing well with PPI which she will  continue.  #2.  Chronic epigastric pain.  She had surgery for refractory gastric ulcer in November 2014.  She has had multiple EGDs since her surgery in she has not been found to have recurrent peptic ulcer disease.  She either has chronic dyspepsia or pain is part parcel of her abdominal pain/IBS.  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  #3.  Esophageal dysphagia.  She has a history of Schatzki's ring.  She did not have ring or stricture on her last EGD of August 2018.  Will arrange  for EGD in near future.  #4.  History of vitamin D deficiency.  She has not responded to high-dose oral vitamin D.  She has no other markers for malabsorption.  Parenteral vitamin D was considered but cost was prohibitive.  Vitamin D level has been gradually coming up but has never been normal.  #4.  Chronic diarrhea felt to be due to IBS.  At times her symptoms are suggestive of dumping syndrome.  She is able to manage it with cholestyramine and antidiarrheal.  #5..  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  Will refill alprazolam.   Plan:  New prescription given for alprazolam 0.25 mg 3 times daily as needed 90 doses without refill. New prescription also given for pantoprazole 40 mg to be taken 30 minutes before breakfast daily.  90 doses with 3 refills. Patient will go to the lab for vitamin D level. Esophagogastroduodenoscopy with esophageal dilation to be scheduled when patient ready. Office visit in 6 months.

## 2020-12-30 NOTE — Patient Instructions (Signed)
Notify if epigastric pain worsens in which case we will proceed with esophagogastroduodenoscopy.

## 2021-01-01 ENCOUNTER — Other Ambulatory Visit (INDEPENDENT_AMBULATORY_CARE_PROVIDER_SITE_OTHER): Payer: Self-pay

## 2021-01-01 DIAGNOSIS — R1319 Other dysphagia: Secondary | ICD-10-CM

## 2021-01-01 DIAGNOSIS — G8929 Other chronic pain: Secondary | ICD-10-CM

## 2021-01-04 ENCOUNTER — Other Ambulatory Visit (INDEPENDENT_AMBULATORY_CARE_PROVIDER_SITE_OTHER): Payer: Self-pay | Admitting: Internal Medicine

## 2021-01-05 ENCOUNTER — Ambulatory Visit (INDEPENDENT_AMBULATORY_CARE_PROVIDER_SITE_OTHER): Payer: BC Managed Care – PPO | Admitting: Cardiology

## 2021-01-05 ENCOUNTER — Encounter: Payer: Self-pay | Admitting: Cardiology

## 2021-01-05 VITALS — BP 110/60 | HR 76 | Ht 64.0 in | Wt 108.4 lb

## 2021-01-05 DIAGNOSIS — R002 Palpitations: Secondary | ICD-10-CM | POA: Diagnosis not present

## 2021-01-05 DIAGNOSIS — I471 Supraventricular tachycardia, unspecified: Secondary | ICD-10-CM

## 2021-01-05 NOTE — Patient Instructions (Signed)
Your physician recommends that you schedule a follow-up appointment in: 1 YEAR WITH DR BRANCH  Your physician recommends that you continue on your current medications as directed. Please refer to the Current Medication list given to you today.  Thank you for choosing [facility]!!    

## 2021-01-05 NOTE — Progress Notes (Signed)
Clinical Summary Ms. Pianka is a 65 y.o.female seen today for follow up of the following medical problems.   1. PSVT - has been on lopressor - some recent symptoms. Occurring daily, lasts just few minutes - compliant with meds - limiting caffeine, no EtOH.  - prior issues with low bp's but none recently.    - occasional palpitations, infrequent and mild. Symptoms have improved since we increased lopressor to 25mg  bid  SH: works at as [employer]    Past Medical History:  Diagnosis Date  . Anemia   . Anxiety   . Chest pain    Nuclear, normal, 2010,  //   hospital August, 2013 no evidence of injury  . Ejection fraction    EF 65%, echo, April 19, 2012  . Gastric ulcer   . GERD (gastroesophageal reflux disease)   . History of palpitations   . Irritable bowel syndrome   . Mitral regurgitation    Mild, echo, August, 2013  . Palpitations   . Reactive airway disease    Pulmonary function studies from June, 2013, show the patient does have significant response to bronchodilators.  . Syncope    Vasovagal     Allergies  Allergen Reactions  . Aspirin Other (See Comments)    Ulcer, Reflux  . Codeine Nausea And Vomiting    Speeds heart up, dizziness  . Fentanyl Nausea And Vomiting    Confusion, weakness  . Nsaids Other (See Comments)    reflux, ulcer  . Propoxyphene N-Acetaminophen Nausea And Vomiting    Speeds heart up, dizziness     Current Outpatient Medications  Medication Sig Dispense Refill  . acetaminophen (TYLENOL) 500 MG tablet Take 500 mg by mouth every 6 (six) hours as needed for headache.    . ALPRAZolam (XANAX) 0.25 MG tablet Take 1 tablet (0.25 mg total) by mouth 3 (three) times daily as needed. (Patient taking differently: Take 0.25 mg by mouth 3 (three) times daily as needed for anxiety.) 90 tablet 0  . Azelastine HCl 0.15 % SOLN Place 1 spray into the nose daily as needed (sinus congestion).     . Cholecalciferol (VITAMIN D3) 5000 UNITS CAPS  Take 5,000 Units by mouth daily.     . cholestyramine (QUESTRAN) 4 g packet DISSOLVE & TAKE ONE POWDER BY MOUTH TWICE DAILY, TAKE 2 HOURS BEFORE OR AFTER TAKING OTHER MEDICINES (Patient taking differently: Take 4 g by mouth daily as needed (diarrhea).) 180 packet 2  . diphenoxylate-atropine (LOMOTIL) 2.5-0.025 MG tablet Take 1 tablet by mouth 4 (four) times daily as needed for diarrhea or loose stools. TAKE 1 TABLET BY MOUTH 4 TIMES DAILY AS NEEDED FOR  DIARRHEA  OR  LOOSE  STOOLS 120 tablet 2  . ferrous gluconate (FERGON) 324 MG tablet Take 324 mg by mouth 2 (two) times a week. Tuesday & Friday    . latanoprost (XALATAN) 0.005 % ophthalmic solution Place 1 drop into both eyes at bedtime.    . metoprolol tartrate (LOPRESSOR) 25 MG tablet Take 1 tablet (25 mg total) by mouth 2 (two) times daily. 180 tablet 3  . Multiple Vitamin (MULTIVITAMIN WITH MINERALS) TABS tablet Take 1 tablet by mouth daily.    . ondansetron (ZOFRAN) 4 MG tablet Take 1 tablet (4 mg total) by mouth every 12 (twelve) hours as needed for nausea. 20 tablet 1  . pantoprazole (PROTONIX) 40 MG tablet Take 1 tablet (40 mg total) by mouth daily before breakfast. 90 tablet 3  .  promethazine (PHENADOZ) 25 MG suppository Place 1 suppository (25 mg total) rectally 2 (two) times daily as needed for nausea or vomiting. 12 each 1  . promethazine (PHENERGAN) 25 MG tablet Take 1 tablet (25 mg total) by mouth 2 (two) times daily as needed for nausea or vomiting. 20 tablet 0  . Simethicone (PHAZYME) 180 MG CAPS Take 1 capsule (180 mg total) by mouth 2 (two) times daily as needed. (Patient taking differently: Take 180 mg by mouth 2 (two) times daily as needed (bloating).)  0  . sucralfate (CARAFATE) 1 g tablet Take 2 tablets (2 g total) by mouth at bedtime as needed. (Patient taking differently: Take 2 g by mouth at bedtime as needed (acid reflux).) 60 tablet 5  . vitamin B-12 (CYANOCOBALAMIN) 1000 MCG tablet Take 1,000 mcg by mouth daily.    Marland Kitchen zinc  gluconate 50 MG tablet Take 50 mg by mouth daily.     No current facility-administered medications for this visit.     Past Surgical History:  Procedure Laterality Date  . ABDOMINAL HYSTERECTOMY    . BIOPSY  08/04/2016   Procedure: BIOPSY;  Surgeon: Malissa Hippo, MD;  Location: [facility];  Service: Endoscopy;;  gastric  . CHOLECYSTECTOMY    . COLONOSCOPY    . COLONOSCOPY WITH PROPOFOL N/A 03/04/2017   Procedure: COLONOSCOPY WITH PROPOFOL;  Surgeon: Malissa Hippo, MD;  [facility]: [facility];  Service: Endoscopy;  Laterality: N/A;  10:10  . ESOPHAGEAL DILATION  06/07/2014   Procedure: ESOPHAGEAL DILATION;  Surgeon: Malissa Hippo, MD;  [facility]: [facility];  Service: Endoscopy;;  . ESOPHAGEAL DILATION N/A 08/27/2015   Procedure: ESOPHAGEAL DILATION;  Surgeon: Malissa Hippo, MD;  [facility];  Service: Endoscopy;  Laterality: N/A;  . ESOPHAGEAL DILATION  08/04/2016   Procedure: ESOPHAGEAL DILATION;  Surgeon: Malissa Hippo, MD;  [facility];  Service: Endoscopy;;  . ESOPHAGEAL DILATION N/A 04/13/2017   Procedure: ESOPHAGEAL DILATION;  Surgeon: Malissa Hippo, MD;  [facility];  Service: Endoscopy;  Laterality: N/A;  . ESOPHAGOGASTRODUODENOSCOPY  02/04/2012   Procedure: ESOPHAGOGASTRODUODENOSCOPY (EGD);  Surgeon: Malissa Hippo, MD;  [facility];  Service: Endoscopy;  Laterality: N/A;  320  . ESOPHAGOGASTRODUODENOSCOPY  06/02/2012   Procedure: ESOPHAGOGASTRODUODENOSCOPY (EGD);  Surgeon: Malissa Hippo, MD;  [facility];  Service: Endoscopy;  Laterality: N/A;  1050  . ESOPHAGOGASTRODUODENOSCOPY N/A 06/07/2014   Procedure: ESOPHAGOGASTRODUODENOSCOPY (EGD);  Surgeon: Malissa Hippo, MD;  [facility];  Service: Endoscopy;  Laterality: N/A;  1035-rescheduled 9/25 @ 8:30 Ann to notify pt  . ESOPHAGOGASTRODUODENOSCOPY N/A 02/17/2015   Procedure: ESOPHAGOGASTRODUODENOSCOPY (EGD);  Surgeon: Malissa Hippo, MD;   [facility];  Service: Endoscopy;  Laterality: N/A;  730  . ESOPHAGOGASTRODUODENOSCOPY N/A 08/27/2015   Procedure: ESOPHAGOGASTRODUODENOSCOPY (EGD);  Surgeon: Malissa Hippo, MD;  [facility];  Service: Endoscopy;  Laterality: N/A;  1155  . ESOPHAGOGASTRODUODENOSCOPY N/A 08/04/2016   Procedure: ESOPHAGOGASTRODUODENOSCOPY (EGD);  Surgeon: Malissa Hippo, MD;  [facility];  Service: Endoscopy;  Laterality: N/A;  225  . ESOPHAGOGASTRODUODENOSCOPY N/A 04/13/2017   Procedure: ESOPHAGOGASTRODUODENOSCOPY (EGD);  Surgeon: Malissa Hippo, MD;  [facility];  Service: Endoscopy;  Laterality: N/A;  . ESOPHAGOGASTRODUODENOSCOPY (EGD) WITH ESOPHAGEAL DILATION N/A 05/31/2013   Procedure: ESOPHAGOGASTRODUODENOSCOPY (EGD) WITH ESOPHAGEAL DILATION;  Surgeon: Malissa Hippo, MD;  [facility];  Service: Endoscopy;  Laterality:  N/A;  . FLEXIBLE SIGMOIDOSCOPY N/A 05/31/2013   Procedure: FLEXIBLE SIGMOIDOSCOPY;  Surgeon: Malissa Hippo, MD;  [facility];  Service: Endoscopy;  Laterality: N/A;  250  . FLEXIBLE SIGMOIDOSCOPY N/A 06/07/2014   Procedure: FLEXIBLE SIGMOIDOSCOPY;  Surgeon: Malissa Hippo, MD;  [facility];  Service: Endoscopy;  Laterality: N/A;  . GIVENS CAPSULE STUDY  05/03/2011   Procedure: GIVENS CAPSULE STUDY;  Surgeon: Malissa Hippo, MD;  [facility];  Service: Endoscopy;  Laterality: N/A;  7:30 am  . STOMACH SURGERY    . UPPER GASTROINTESTINAL ENDOSCOPY       Allergies  Allergen Reactions  . Aspirin Other (See Comments)    Ulcer, Reflux  . Codeine Nausea And Vomiting    Speeds heart up, dizziness  . Fentanyl Nausea And Vomiting    Confusion, weakness  . Nsaids Other (See Comments)    reflux, ulcer  . Propoxyphene N-Acetaminophen Nausea And Vomiting    Speeds heart up, dizziness      Family History  Problem Relation Age of Onset  . Healthy Mother   . Heart attack Father   . Healthy  Sister   . Colon cancer Neg Hx      Social History Ms. Ra reports that she has never smoked. She has never used smokeless tobacco. Ms. Paxson reports no history of alcohol use.   Review of Systems CONSTITUTIONAL: No weight loss, fever, chills, weakness or fatigue.  HEENT: Eyes: No visual loss, blurred vision, double vision or yellow sclerae.No hearing loss, sneezing, congestion, runny nose or sore throat.  SKIN: No rash or itching.  CARDIOVASCULAR: per hpi RESPIRATORY: No shortness of breath, cough or sputum.  GASTROINTESTINAL: No anorexia, nausea, vomiting or diarrhea. No abdominal pain or blood.  GENITOURINARY: No burning on urination, no polyuria NEUROLOGICAL: No headache, dizziness, syncope, paralysis, ataxia, numbness or tingling in the extremities. No change in bowel or bladder control.  MUSCULOSKELETAL: No muscle, back pain, joint pain or stiffness.  LYMPHATICS: No enlarged nodes. No history of splenectomy.  PSYCHIATRIC: No history of depression or anxiety.  ENDOCRINOLOGIC: No reports of sweating, cold or heat intolerance. No polyuria or polydipsia.  Marland Kitchen   Physical Examination Vitals:   01/05/21 0804  BP: 110/60  Pulse: 76  SpO2: 98%   Filed Weights   01/05/21 0804  Weight: 108 lb 6.4 oz (49.2 kg)    Gen: resting comfortably, no acute distress HEENT: no scleral icterus, pupils equal round and reactive, no palptable cervical adenopathy,  CV: RRR, no m/r/g, no jvd Resp: Clear to auscultation bilaterally GI: abdomen is soft, non-tender, non-distended, normal bowel sounds, no hepatosplenomegaly MSK: extremities are warm, no edema.  Skin: warm, no rash Neuro:  no focal deficits Psych: appropriate affect     Assessment and Plan   1. PSVT/Palpitations -overall doing well since we increased her lopressor - continue current med - if significant recurrent palpitations would consider repeating an event monitor - dosing of av nodal agetns has been limited due to  soft bp's in the past, no current issues.    F/u 1 year     Antoine Poche, M.D

## 2021-01-08 NOTE — Patient Instructions (Signed)
20    Your procedure is scheduled on: 01/14/2021  Report to [facility] Main entrance at   10:30  AM.  Call this number if you have problems the morning of surgery: [phone]   Remember:   Follow instructions on letter from office regarding when to stop eating and drinking        No Smoking the day of procedure      Take these medicines the morning of surgery with A SIP OF WATER: Xanax, Metoprolol, and  Pantoprazole                Phenergan or zofran if needed   Do not wear jewelry, make-up or nail polish.  Do not wear lotions, powders, or perfumes. You may wear deodorant.                Do not bring valuables to the hospital.  Contacts, dentures or bridgework may not be worn into surgery.  Leave suitcase in the car. After surgery it may be brought to your room.  For patients admitted to the hospital, checkout time is 11:00 AM the day of discharge.   Patients discharged the day of surgery will not be allowed to drive home. Upper Endoscopy, Adult Upper endoscopy is a procedure to look inside the upper GI (gastrointestinal) tract. The upper GI tract is made up of:  The part of the body that moves food from your mouth to your stomach (esophagus).  The stomach.  The first part of your small intestine (duodenum). This procedure is also called esophagogastroduodenoscopy (EGD) or gastroscopy. In this procedure, your health care provider passes a thin, flexible tube (endoscope) through your mouth and down your esophagus into your stomach. A small camera is attached to the end of the tube. Images from the camera appear on a monitor in the exam room. During this procedure, your health care provider may also remove a small piece of tissue to be sent to a lab and examined under a microscope (biopsy). Your health care provider may do an upper endoscopy to diagnose cancers of the upper GI tract. You may also have this procedure to find the cause of other conditions, such as:  Stomach  pain.  Heartburn.  Pain or problems when swallowing.  Nausea and vomiting.  Stomach bleeding.  Stomach ulcers. Tell a health care provider about:  Any allergies you have.  All medicines you are taking, including vitamins, herbs, eye drops, creams, and over-the-counter medicines.  Any problems you or family members have had with anesthetic medicines.  Any blood disorders you have.  Any surgeries you have had.  Any medical conditions you have.  Whether you are pregnant or may be pregnant. What are the risks? Generally, this is a safe procedure. However, problems may occur, including:  Infection.  Bleeding.  Allergic reactions to medicines.  A tear or hole (perforation) in the esophagus, stomach, or duodenum. What happens before the procedure? Staying hydrated Follow instructions from your health care provider about hydration, which may include:  Up to 2 hours before the procedure - you may continue to drink clear liquids, such as water, clear fruit juice, black coffee, and plain tea.  Eating and drinking restrictions Follow instructions from your health care provider about eating and drinking, which may include:  8 hours before the procedure - stop eating heavy meals or foods, such as meat, fried foods, or fatty foods.  6 hours before the procedure - stop eating light meals or foods, such as  toast or cereal.  6 hours before the procedure - stop drinking milk or drinks that contain milk.  2 hours before the procedure - stop drinking clear liquids. Medicines Ask your health care provider about:  Changing or stopping your regular medicines. This is especially important if you are taking diabetes medicines or blood thinners.  Taking medicines such as aspirin and ibuprofen. These medicines can thin your blood. Do not take these medicines unless your health care provider tells you to take them.  Taking over-the-counter medicines, vitamins, herbs, and  supplements. General instructions  Plan to have someone take you home from the hospital or clinic.  If you will be going home right after the procedure, plan to have someone with you for 24 hours.  Ask your health care provider what steps will be taken to help prevent infection. What happens during the procedure?  1. An IV will be inserted into one of your veins. 2. You may be given one or more of the following: ? A medicine to help you relax (sedative). ? A medicine to numb the throat (local anesthetic). 3. You will lie on your left side on an exam table. 4. Your health care provider will pass the endoscope through your mouth and down your esophagus. 5. Your health care provider will use the scope to check the inside of your esophagus, stomach, and duodenum. Biopsies may be taken. 6. The endoscope will be removed. The procedure may vary among health care providers and hospitals. What happens after the procedure?  Your blood pressure, heart rate, breathing rate, and blood oxygen level will be monitored until you leave the hospital or clinic.  Do not drive for 24 hours if you were given a sedative during your procedure.  When your throat is no longer numb, you may be given some fluids to drink.  It is up to you to get the results of your procedure. Ask your health care provider, or the department that is doing the procedure, when your results will be ready. Summary  Upper endoscopy is a procedure to look inside the upper GI tract.  During the procedure, an IV will be inserted into one of your veins. You may be given a medicine to help you relax.  A medicine will be used to numb your throat.  The endoscope will be passed through your mouth and down your esophagus. This information is not intended to replace advice given to you by your health care provider. Make sure you discuss any questions you have with your health care provider. Document Revised: 02/22/2018 Document Reviewed:  01/30/2018 Elsevier Patient Education  2020 [employer].                                                                                                                                      EndoscopyCare After  Please read the instructions outlined below and refer to this sheet in the  next few weeks. These discharge instructions provide you with general information on caring for yourself after you leave the hospital. Your doctor may also give you specific instructions. While your treatment has been planned according to the most current medical practices available, unavoidable complications occasionally occur. If you have any problems or questions after discharge, please call your doctor. HOME CARE INSTRUCTIONS Activity  You may resume your regular activity but move at a slower pace for the next 24 hours.   Take frequent rest periods for the next 24 hours.   Walking will help expel (get rid of) the air and reduce the bloated feeling in your abdomen.   No driving for 24 hours (because of the anesthesia (medicine) used during the test).   You may shower.   Do not sign any important legal documents or operate any machinery for 24 hours (because of the anesthesia used during the test).  Nutrition  Drink plenty of fluids.   You may resume your normal diet.   Begin with a light meal and progress to your normal diet.   Avoid alcoholic beverages for 24 hours or as instructed by your caregiver.  Medications You may resume your normal medications unless your caregiver tells you otherwise. What you can expect today  You may experience abdominal discomfort such as a feeling of fullness or "gas" pains.   You may experience a sore throat for 2 to 3 days. This is normal. Gargling with salt water may help this.  Follow-up Your doctor will discuss the results of your test with you. SEEK IMMEDIATE MEDICAL CARE IF:  You have excessive nausea (feeling sick to your stomach) and/or vomiting.    You have severe abdominal pain and distention (swelling).   You have trouble swallowing.   You have a temperature over 100 F (37.8 C).   You have rectal bleeding or vomiting of blood.  Document Released: 04/13/2004 Document Revised: 08/19/2011 Document Reviewed: 10/25/2007

## 2021-01-13 ENCOUNTER — Encounter (HOSPITAL_COMMUNITY): Payer: Self-pay

## 2021-01-13 ENCOUNTER — Other Ambulatory Visit: Payer: Self-pay

## 2021-01-13 ENCOUNTER — Other Ambulatory Visit (HOSPITAL_COMMUNITY)
Admission: RE | Admit: 2021-01-13 | Discharge: 2021-01-13 | Disposition: A | Discharge status: Home / Self Care | Payer: BC Managed Care – PPO | Source: Ambulatory Visit | Attending: Internal Medicine | Admitting: Internal Medicine

## 2021-01-13 ENCOUNTER — Encounter (HOSPITAL_COMMUNITY)
Admission: RE | Admit: 2021-01-13 | Discharge: 2021-01-13 | Disposition: A | Discharge status: Home / Self Care | Payer: BC Managed Care – PPO | Source: Ambulatory Visit | Attending: Internal Medicine | Admitting: Internal Medicine

## 2021-01-13 DIAGNOSIS — Z8711 Personal history of peptic ulcer disease: Secondary | ICD-10-CM | POA: Diagnosis not present

## 2021-01-13 DIAGNOSIS — G8929 Other chronic pain: Secondary | ICD-10-CM

## 2021-01-13 DIAGNOSIS — K219 Gastro-esophageal reflux disease without esophagitis: Secondary | ICD-10-CM | POA: Diagnosis not present

## 2021-01-13 DIAGNOSIS — R1013 Epigastric pain: Secondary | ICD-10-CM | POA: Diagnosis present

## 2021-01-13 DIAGNOSIS — R1319 Other dysphagia: Secondary | ICD-10-CM

## 2021-01-13 DIAGNOSIS — Z980 Intestinal bypass and anastomosis status: Secondary | ICD-10-CM | POA: Diagnosis not present

## 2021-01-13 DIAGNOSIS — Z01812 Encounter for preprocedural laboratory examination: Secondary | ICD-10-CM | POA: Insufficient documentation

## 2021-01-13 DIAGNOSIS — Z8249 Family history of ischemic heart disease and other diseases of the circulatory system: Secondary | ICD-10-CM | POA: Diagnosis not present

## 2021-01-13 DIAGNOSIS — Z79899 Other long term (current) drug therapy: Secondary | ICD-10-CM | POA: Diagnosis not present

## 2021-01-13 DIAGNOSIS — Z20822 Contact with and (suspected) exposure to covid-19: Secondary | ICD-10-CM | POA: Insufficient documentation

## 2021-01-13 DIAGNOSIS — K319 Disease of stomach and duodenum, unspecified: Secondary | ICD-10-CM | POA: Diagnosis not present

## 2021-01-13 DIAGNOSIS — Z885 Allergy status to narcotic agent status: Secondary | ICD-10-CM | POA: Diagnosis not present

## 2021-01-13 DIAGNOSIS — Z886 Allergy status to analgesic agent status: Secondary | ICD-10-CM | POA: Diagnosis not present

## 2021-01-13 DIAGNOSIS — R1314 Dysphagia, pharyngoesophageal phase: Secondary | ICD-10-CM | POA: Diagnosis not present

## 2021-01-13 DIAGNOSIS — Z888 Allergy status to other drugs, medicaments and biological substances status: Secondary | ICD-10-CM | POA: Diagnosis not present

## 2021-01-13 LAB — SARS CORONAVIRUS 2 (TAT 6-24 HRS): SARS Coronavirus 2: NEGATIVE

## 2021-01-14 ENCOUNTER — Encounter (HOSPITAL_COMMUNITY): Payer: Self-pay | Admitting: Internal Medicine

## 2021-01-14 ENCOUNTER — Ambulatory Visit (HOSPITAL_COMMUNITY): Payer: BC Managed Care – PPO | Admitting: Certified Registered Nurse Anesthetist

## 2021-01-14 ENCOUNTER — Encounter (HOSPITAL_COMMUNITY)
Admission: RE | Disposition: A | Discharge status: Home / Self Care | Payer: Self-pay | Source: Home / Self Care | Attending: Internal Medicine

## 2021-01-14 ENCOUNTER — Other Ambulatory Visit (INDEPENDENT_AMBULATORY_CARE_PROVIDER_SITE_OTHER): Payer: Self-pay

## 2021-01-14 ENCOUNTER — Ambulatory Visit (HOSPITAL_COMMUNITY)
Admission: RE | Admit: 2021-01-14 | Discharge: 2021-01-14 | Disposition: A | Discharge status: Home / Self Care | Payer: BC Managed Care – PPO | Attending: Internal Medicine | Admitting: Internal Medicine

## 2021-01-14 DIAGNOSIS — R1013 Epigastric pain: Secondary | ICD-10-CM | POA: Diagnosis not present

## 2021-01-14 DIAGNOSIS — Z980 Intestinal bypass and anastomosis status: Secondary | ICD-10-CM | POA: Insufficient documentation

## 2021-01-14 DIAGNOSIS — K2970 Gastritis, unspecified, without bleeding: Secondary | ICD-10-CM | POA: Diagnosis not present

## 2021-01-14 DIAGNOSIS — Z79899 Other long term (current) drug therapy: Secondary | ICD-10-CM | POA: Insufficient documentation

## 2021-01-14 DIAGNOSIS — Z20822 Contact with and (suspected) exposure to covid-19: Secondary | ICD-10-CM | POA: Insufficient documentation

## 2021-01-14 DIAGNOSIS — Z885 Allergy status to narcotic agent status: Secondary | ICD-10-CM | POA: Insufficient documentation

## 2021-01-14 DIAGNOSIS — Z8249 Family history of ischemic heart disease and other diseases of the circulatory system: Secondary | ICD-10-CM | POA: Insufficient documentation

## 2021-01-14 DIAGNOSIS — K3189 Other diseases of stomach and duodenum: Secondary | ICD-10-CM | POA: Diagnosis not present

## 2021-01-14 DIAGNOSIS — K319 Disease of stomach and duodenum, unspecified: Secondary | ICD-10-CM | POA: Insufficient documentation

## 2021-01-14 DIAGNOSIS — Z886 Allergy status to analgesic agent status: Secondary | ICD-10-CM | POA: Insufficient documentation

## 2021-01-14 DIAGNOSIS — R1314 Dysphagia, pharyngoesophageal phase: Secondary | ICD-10-CM | POA: Diagnosis not present

## 2021-01-14 DIAGNOSIS — Z8711 Personal history of peptic ulcer disease: Secondary | ICD-10-CM | POA: Insufficient documentation

## 2021-01-14 DIAGNOSIS — K219 Gastro-esophageal reflux disease without esophagitis: Secondary | ICD-10-CM | POA: Insufficient documentation

## 2021-01-14 DIAGNOSIS — Z888 Allergy status to other drugs, medicaments and biological substances status: Secondary | ICD-10-CM | POA: Insufficient documentation

## 2021-01-14 DIAGNOSIS — G8929 Other chronic pain: Secondary | ICD-10-CM

## 2021-01-14 DIAGNOSIS — R1319 Other dysphagia: Secondary | ICD-10-CM

## 2021-01-14 HISTORY — PX: BIOPSY: SHX5522

## 2021-01-14 HISTORY — PX: ESOPHAGOGASTRODUODENOSCOPY (EGD) WITH PROPOFOL: SHX5813

## 2021-01-14 SURGERY — ESOPHAGOGASTRODUODENOSCOPY (EGD) WITH PROPOFOL
Anesthesia: General

## 2021-01-14 MED ORDER — PROPOFOL 10 MG/ML IV BOLUS
INTRAVENOUS | Status: DC | PRN
  Administered 2021-01-14: 30 mg via INTRAVENOUS

## 2021-01-14 MED ORDER — PROPOFOL 10 MG/ML IV BOLUS
INTRAVENOUS | Status: AC
  Filled 2021-01-14: qty 120

## 2021-01-14 MED ORDER — CHLORHEXIDINE GLUCONATE CLOTH 2 % EX PADS: 6.0000 | MEDICATED_PAD | Freq: Once | CUTANEOUS | Status: DC

## 2021-01-14 MED ORDER — PHENYLEPHRINE 40 MCG/ML (10ML) SYRINGE FOR IV PUSH (FOR BLOOD PRESSURE SUPPORT)
PREFILLED_SYRINGE | INTRAVENOUS | Status: DC | PRN
  Administered 2021-01-14: 40 ug via INTRAVENOUS

## 2021-01-14 MED ORDER — PROPOFOL 500 MG/50ML IV EMUL
INTRAVENOUS | Status: DC | PRN
  Administered 2021-01-14: 150 ug/kg/min via INTRAVENOUS

## 2021-01-14 MED ORDER — LACTATED RINGERS IV SOLN: INTRAVENOUS | Status: DC

## 2021-01-14 MED ORDER — LIDOCAINE HCL (CARDIAC) PF 100 MG/5ML IV SOSY
PREFILLED_SYRINGE | INTRAVENOUS | Status: DC | PRN
  Administered 2021-01-14: 50 mg via INTRATRACHEAL

## 2021-01-14 NOTE — Anesthesia Preprocedure Evaluation (Signed)
Anesthesia Evaluation  Patient identified by MRN, date of birth, ID band Patient awake    Reviewed: Allergy & Precautions, H&P , NPO status , Patient's Chart, lab work & pertinent test results, reviewed documented beta blocker date and time   Airway Mallampati: II  TM Distance: >3 FB Neck ROM: full    Dental no notable dental hx.    Pulmonary shortness of breath,    Pulmonary exam normal breath sounds clear to auscultation       Cardiovascular Exercise Tolerance: Good negative cardio ROS   Rhythm:regular Rate:Normal     Neuro/Psych PSYCHIATRIC DISORDERS Anxiety negative neurological ROS     GI/Hepatic Neg liver ROS, PUD, GERD  Medicated,  Endo/Other  negative endocrine ROS  Renal/GU negative Renal ROS  negative genitourinary   Musculoskeletal   Abdominal   Peds  Hematology  (+) Blood dyscrasia, anemia ,   Anesthesia Other Findings   Reproductive/Obstetrics negative OB ROS                             Anesthesia Physical Anesthesia Plan  ASA: II  Anesthesia Plan: General   Post-op Pain Management:    Induction:   PONV Risk Score and Plan: Propofol infusion  Airway Management Planned:   Additional Equipment:   Intra-op Plan:   Post-operative Plan:   Informed Consent: I have reviewed the patients History and Physical, chart, labs and discussed the procedure including the risks, benefits and alternatives for the proposed anesthesia with the patient or authorized representative who has indicated his/her understanding and acceptance.     Dental Advisory Given  Plan Discussed with: CRNA  Anesthesia Plan Comments:         Anesthesia Quick Evaluation

## 2021-01-14 NOTE — Progress Notes (Signed)
Patient c/o nausea, no emesis, patient verbalizes she will use her phenergan at home for symptoms if they recur.

## 2021-01-14 NOTE — Transfer of Care (Signed)
Immediate Anesthesia Transfer of Care Note  Patient: Lindsay Lawrence  Procedure(s) Performed: ESOPHAGOGASTRODUODENOSCOPY (EGD) WITH PROPOFOL (N/A ) BIOPSY  Patient Location: Short Stay  Anesthesia Type:General  Level of Consciousness: awake  Airway & Oxygen Therapy: Patient Spontanous Breathing  Post-op Assessment: Report given to RN and Post -op Vital signs reviewed and stable  Post vital signs: Reviewed and stable  Last Vitals:  Vitals Value Taken Time  BP    Temp    Pulse    Resp    SpO2      Last Pain:  Vitals:   01/14/21 0811  TempSrc:   PainSc: 7       Patients Stated Pain Goal: 7 (01/14/21 0750)  Complications: No complications documented.

## 2021-01-14 NOTE — H&P (Signed)
Lindsay Lawrence is an 65 y.o. female.   Chief Complaint: Patient is here for esophagogastroduodenoscopy and esophageal dilation. HPI: Patient is 65 year old Caucasian female who has chronic GERD history of peptic ulcer disease status post partial gastrectomy over 5 years ago who presents with dysphagia to solids almost occurring daily.  She points to suprasternal area as well as midsternal area sort of bolus obstruction.  Food bolus eventually passes down.  She complains of frequent nausea without vomiting.  She also has epigastric pain worse with meals.  She denies melena or rectal bleeding.  Her last EGD with EGD was in August 2018. She does not take aspirin or NSAIDs.  Past Medical History:  Diagnosis Date  . Anemia   . Anxiety   . Chest pain    Nuclear, normal, 2010,  //   hospital August, 2013 no evidence of injury  . Ejection fraction    EF 65%, echo, April 19, 2012  . Gastric ulcer   . GERD (gastroesophageal reflux disease)   . History of palpitations   . Irritable bowel syndrome   . Mitral regurgitation    Mild, echo, August, 2013  . Palpitations   . Reactive airway disease    Pulmonary function studies from June, 2013, show the patient does have significant response to bronchodilators.  . Syncope    Vasovagal    Past Surgical History:  Procedure Laterality Date  . ABDOMINAL HYSTERECTOMY    . BIOPSY  08/04/2016   Procedure: BIOPSY;  Surgeon: Malissa Hippo, MD;  [facility];  Service: Endoscopy;;  gastric  . CHOLECYSTECTOMY    . COLONOSCOPY    . COLONOSCOPY WITH PROPOFOL N/A 03/04/2017   Procedure: COLONOSCOPY WITH PROPOFOL;  Surgeon: Malissa Hippo, MD;  [facility]: [facility];  Service: Endoscopy;  Laterality: N/A;  10:10  . ESOPHAGEAL DILATION  06/07/2014   Procedure: ESOPHAGEAL DILATION;  Surgeon: Malissa Hippo, MD;  [facility]: [facility];  Service: Endoscopy;;  . ESOPHAGEAL DILATION N/A 08/27/2015   Procedure: ESOPHAGEAL DILATION;  Surgeon: Malissa Hippo, MD;  [facility];  Service: Endoscopy;  Laterality: N/A;  . ESOPHAGEAL DILATION  08/04/2016   Procedure: ESOPHAGEAL DILATION;  Surgeon: Malissa Hippo, MD;  [facility];  Service: Endoscopy;;  . ESOPHAGEAL DILATION N/A 04/13/2017   Procedure: ESOPHAGEAL DILATION;  Surgeon: Malissa Hippo, MD;  [facility];  Service: Endoscopy;  Laterality: N/A;  . ESOPHAGOGASTRODUODENOSCOPY  02/04/2012   Procedure: ESOPHAGOGASTRODUODENOSCOPY (EGD);  Surgeon: Malissa Hippo, MD;  [facility];  Service: Endoscopy;  Laterality: N/A;  320  . ESOPHAGOGASTRODUODENOSCOPY  06/02/2012   Procedure: ESOPHAGOGASTRODUODENOSCOPY (EGD);  Surgeon: Malissa Hippo, MD;  [facility];  Service: Endoscopy;  Laterality: N/A;  1050  . ESOPHAGOGASTRODUODENOSCOPY N/A 06/07/2014   Procedure: ESOPHAGOGASTRODUODENOSCOPY (EGD);  Surgeon: Malissa Hippo, MD;  [facility];  Service: Endoscopy;  Laterality: N/A;  1035-rescheduled 9/25 @ 8:30 Ann to notify pt  . ESOPHAGOGASTRODUODENOSCOPY N/A 02/17/2015   Procedure: ESOPHAGOGASTRODUODENOSCOPY (EGD);  Surgeon: Malissa Hippo, MD;  [facility];  Service: Endoscopy;  Laterality: N/A;  730  . ESOPHAGOGASTRODUODENOSCOPY N/A 08/27/2015   Procedure: ESOPHAGOGASTRODUODENOSCOPY (EGD);  Surgeon: Malissa Hippo, MD;  [facility];  Service: Endoscopy;  Laterality: N/A;  1155  . ESOPHAGOGASTRODUODENOSCOPY N/A 08/04/2016   Procedure: ESOPHAGOGASTRODUODENOSCOPY (EGD);  Surgeon: Malissa Hippo, MD;  [facility];  Service: Endoscopy;  Laterality:  N/A;  225  . ESOPHAGOGASTRODUODENOSCOPY N/A 04/13/2017   Procedure: ESOPHAGOGASTRODUODENOSCOPY (EGD);  Surgeon: Malissa Hippo, MD;  [facility];  Service: Endoscopy;  Laterality: N/A;  . ESOPHAGOGASTRODUODENOSCOPY (EGD) WITH ESOPHAGEAL DILATION N/A 05/31/2013   Procedure: ESOPHAGOGASTRODUODENOSCOPY (EGD) WITH ESOPHAGEAL DILATION;  Surgeon:  Malissa Hippo, MD;  [facility];  Service: Endoscopy;  Laterality: N/A;  . FLEXIBLE SIGMOIDOSCOPY N/A 05/31/2013   Procedure: FLEXIBLE SIGMOIDOSCOPY;  Surgeon: Malissa Hippo, MD;  [facility];  Service: Endoscopy;  Laterality: N/A;  250  . FLEXIBLE SIGMOIDOSCOPY N/A 06/07/2014   Procedure: FLEXIBLE SIGMOIDOSCOPY;  Surgeon: Malissa Hippo, MD;  [facility];  Service: Endoscopy;  Laterality: N/A;  . GIVENS CAPSULE STUDY  05/03/2011   Procedure: GIVENS CAPSULE STUDY;  Surgeon: Malissa Hippo, MD;  [facility];  Service: Endoscopy;  Laterality: N/A;  7:30 am  . STOMACH SURGERY     2015- "ulcer surgery".  . UPPER GASTROINTESTINAL ENDOSCOPY      Family History  Problem Relation Age of Onset  . Healthy Mother   . Heart attack Father   . Healthy Sister   . Colon cancer Neg Hx    Social History:  reports that she has never smoked. She has never used smokeless tobacco. She reports that she does not drink alcohol and does not use drugs.  Allergies:  Allergies  Allergen Reactions  . Aspirin Other (See Comments)    Ulcer, Reflux  . Codeine Nausea And Vomiting    Speeds heart up, dizziness  . Fentanyl Nausea And Vomiting    Confusion, weakness  . Nsaids Other (See Comments)    reflux, ulcer  . Propoxyphene N-Acetaminophen Nausea And Vomiting    Speeds heart up, dizziness    Medications Prior to Admission  Medication Sig Dispense Refill  . acetaminophen (TYLENOL) 500 MG tablet Take 500 mg by mouth every 6 (six) hours as needed for headache.    . ALPRAZolam (XANAX) 0.25 MG tablet Take 1 tablet (0.25 mg total) by mouth 3 (three) times daily as needed. (Patient taking differently: Take 0.25 mg by mouth 3 (three) times daily as needed for anxiety.) 90 tablet 0  . Azelastine HCl 0.15 % SOLN Place 1 spray into the nose daily as needed (sinus congestion).     . Cholecalciferol (VITAMIN D3) 5000 UNITS CAPS Take 5,000 Units by mouth daily.     .  cholestyramine (QUESTRAN) 4 g packet DISSOLVE & TAKE ONE POWDER BY MOUTH TWICE DAILY, TAKE 2 HOURS BEFORE OR AFTER TAKING OTHER MEDICINES (Patient taking differently: Take 4 g by mouth daily as needed (diarrhea).) 180 packet 2  . diphenoxylate-atropine (LOMOTIL) 2.5-0.025 MG tablet Take 1 tablet by mouth 4 (four) times daily as needed for diarrhea or loose stools. TAKE 1 TABLET BY MOUTH 4 TIMES DAILY AS NEEDED FOR  DIARRHEA  OR  LOOSE  STOOLS 120 tablet 2  . ferrous gluconate (FERGON) 324 MG tablet Take 324 mg by mouth 2 (two) times a week. Tuesday & Friday    . latanoprost (XALATAN) 0.005 % ophthalmic solution Place 1 drop into both eyes at bedtime.    . metoprolol tartrate (LOPRESSOR) 25 MG tablet Take 1 tablet (25 mg total) by mouth 2 (two) times daily. 180 tablet 3  . Multiple Vitamin (MULTIVITAMIN WITH MINERALS) TABS tablet Take 1 tablet by mouth daily.    . ondansetron (ZOFRAN) 4 MG tablet Take 1 tablet (4 mg total) by mouth every  12 (twelve) hours as needed for nausea. 20 tablet 1  . pantoprazole (PROTONIX) 40 MG tablet Take 1 tablet (40 mg total) by mouth daily before breakfast. 90 tablet 3  . promethazine (PHENADOZ) 25 MG suppository Place 1 suppository (25 mg total) rectally 2 (two) times daily as needed for nausea or vomiting. 12 each 1  . promethazine (PHENERGAN) 25 MG tablet Take 1 tablet (25 mg total) by mouth 2 (two) times daily as needed for nausea or vomiting. 20 tablet 0  . Simethicone (PHAZYME) 180 MG CAPS Take 1 capsule (180 mg total) by mouth 2 (two) times daily as needed. (Patient taking differently: Take 180 mg by mouth 2 (two) times daily as needed (bloating).)  0  . sucralfate (CARAFATE) 1 g tablet Take 2 tablets (2 g total) by mouth at bedtime as needed (acid reflux). 60 tablet 5  . vitamin B-12 (CYANOCOBALAMIN) 1000 MCG tablet Take 1,000 mcg by mouth daily.    Marland Kitchen. zinc gluconate 50 MG tablet Take 50 mg by mouth daily.      Results for orders placed or performed during the  hospital encounter of 01/13/21 (from the past 48 hour(s))  SARS CORONAVIRUS 2 (TAT 6-24 HRS) Nasopharyngeal Nasopharyngeal Swab     Status: None   Collection Time: 01/13/21  7:58 AM   Specimen: Nasopharyngeal Swab  Result Value Ref Range   SARS Coronavirus 2 NEGATIVE NEGATIVE    Comment: (NOTE) SARS-CoV-2 target nucleic acids are NOT DETECTED.  The SARS-CoV-2 RNA is generally detectable in upper and lower respiratory specimens during the acute phase of infection. Negative results do not preclude SARS-CoV-2 infection, do not rule out co-infections with other pathogens, and should not be used as the sole basis for treatment or other patient management decisions. Negative results must be combined with clinical observations, patient history, and epidemiological information. The expected result is Negative.  Fact Sheet for Patients: HairSlick.nohttps://www.fda.gov/media/138098/download  Fact Sheet for Healthcare Providers: quierodirigir.comhttps://www.fda.gov/media/138095/download  This test is not yet approved or cleared by the [place] FDA and  has been authorized for detection and/or diagnosis of SARS-CoV-2 by FDA under an Emergency Use Authorization (EUA). This EUA will remain  in effect (meaning this test can be used) for the duration of the COVID-19 declaration under Se ction 564(b)(1) of the Act, 21 U.S.C. section 360bbb-3(b)(1), unless the authorization is terminated or revoked sooner.  Performed at [facility], [address] [place], [place] [address]    No results found.  Review of Systems  Blood pressure 124/80, temperature 98 F (36.7 C), temperature source Oral, resp. rate 12, SpO2 100 %. Physical Exam Constitutional:      Comments: Thin Caucasian female in NAD. Patient is alert.  HENT:     Mouth/Throat:     Mouth: Mucous membranes are moist.     Pharynx: Oropharynx is clear.  Eyes:     General: No scleral icterus.    Conjunctiva/sclera: Conjunctivae normal.   Cardiovascular:     Rate and Rhythm: Normal rate and regular rhythm.     Heart sounds: Normal heart sounds. No murmur heard.   Pulmonary:     Effort: Pulmonary effort is normal.     Breath sounds: Normal breath sounds.  Abdominal:     Comments: Abdomen is flat.  She has upper midline scar.  On palpation it is soft with mild midepigastric tenderness.  Tenderness also noted at LLQ.  No organomegaly or masses.  Musculoskeletal:        General:  No swelling.     Cervical back: Neck supple.  Lymphadenopathy:     Cervical: No cervical adenopathy.  Skin:    General: Skin is warm and dry.      Assessment/Plan  Esophageal dysphagia in patient with chronic GERD. Epigastric pain.  History of peptic ulcer disease. Esophagogastroduodenoscopy with esophageal dilation.  Lionel December, MD 01/14/2021, 8:06 AM

## 2021-01-14 NOTE — Discharge Instructions (Signed)
Clear liquids today. Gastroparesis diet starting tomorrow morning.  Office will mail you information. Resume usual medications as before. No driving for 24 hours. Physician will call with biopsy results.     Upper Endoscopy, Adult, Care After This sheet gives you information about how to care for yourself after your procedure. Your health care provider may also give you more specific instructions. If you have problems or questions, contact your health care provider. What can I expect after the procedure? After the procedure, it is common to have:  A sore throat.  Mild stomach pain or discomfort.  Bloating.  Nausea. Follow these instructions at home:  Follow instructions from your health care provider about what to eat or drink after your procedure.  Return to your normal activities as told by your health care provider. Ask your health care provider what activities are safe for you.  Take over-the-counter and prescription medicines only as told by your health care provider.  If you were given a sedative during the procedure, it can affect you for several hours. Do not drive or operate machinery until your health care provider says that it is safe.  Keep all follow-up visits as told by your health care provider. This is important.   Contact a health care provider if you have:  A sore throat that lasts longer than one day.  Trouble swallowing. Get help right away if:  You vomit blood or your vomit looks like coffee grounds.  You have: ? A fever. ? Bloody, black, or tarry stools. ? A severe sore throat or you cannot swallow. ? Difficulty breathing. ? Severe pain in your chest or abdomen. Summary  After the procedure, it is common to have a sore throat, mild stomach discomfort, bloating, and nausea.  If you were given a sedative during the procedure, it can affect you for several hours. Do not drive or operate machinery until your health care provider says that it is  safe.  Follow instructions from your health care provider about what to eat or drink after your procedure.  Return to your normal activities as told by your health care provider. This information is not intended to replace advice given to you by your health care provider. Make sure you discuss any questions you have with your health care provider. Document Revised: 08/28/2019 Document Reviewed: 01/30/2018 Elsevier Patient Education  2021 Elsevier Inc.      Monitored Anesthesia Care, Care After This sheet gives you information about how to care for yourself after your procedure. Your health care provider may also give you more specific instructions. If you have problems or questions, contact your health care provider. What can I expect after the procedure? After the procedure, it is common to have:  Tiredness.  Forgetfulness about what happened after the procedure.  Impaired judgment for important decisions.  Nausea or vomiting.  Some difficulty with balance. Follow these instructions at home: For the time period you were told by your health care provider:  Rest as needed.  Do not participate in activities where you could fall or become injured.  Do not drive or use machinery.  Do not drink alcohol.  Do not take sleeping pills or medicines that cause drowsiness.  Do not make important decisions or sign legal documents.  Do not take care of children on your own.      Eating and drinking  Follow the diet that is recommended by your health care provider.  Drink enough fluid to keep your urine pale  yellow.  If you vomit: ? Drink water, juice, or soup when you can drink without vomiting. ? Make sure you have little or no nausea before eating solid foods. General instructions  Have a responsible adult stay with you for the time you are told. It is important to have someone help care for you until you are awake and alert.  Take over-the-counter and prescription  medicines only as told by your health care provider.  If you have sleep apnea, surgery and certain medicines can increase your risk for breathing problems. Follow instructions from your health care provider about wearing your sleep device: ? Anytime you are sleeping, including during daytime naps. ? While taking prescription pain medicines, sleeping medicines, or medicines that make you drowsy.  Avoid smoking.  Keep all follow-up visits as told by your health care provider. This is important. Contact a health care provider if:  You keep feeling nauseous or you keep vomiting.  You feel light-headed.  You are still sleepy or having trouble with balance after 24 hours.  You develop a rash.  You have a fever.  You have redness or swelling around the IV site. Get help right away if:  You have trouble breathing.  You have new-onset confusion at home. Summary  For several hours after your procedure, you may feel tired. You may also be forgetful and have poor judgment.  Have a responsible adult stay with you for the time you are told. It is important to have someone help care for you until you are awake and alert.  Rest as told. Do not drive or operate machinery. Do not drink alcohol or take sleeping pills.  Get help right away if you have trouble breathing, or if you suddenly become confused. This information is not intended to replace advice given to you by your health care provider. Make sure you discuss any questions you have with your health care provider. Document Revised: 05/15/2020 Document Reviewed: 08/02/2019 Elsevier Patient Education  2021 [employer].

## 2021-01-14 NOTE — Anesthesia Postprocedure Evaluation (Signed)
Anesthesia Post Note  Patient: Lindsay Lawrence  Procedure(s) Performed: ESOPHAGOGASTRODUODENOSCOPY (EGD) WITH PROPOFOL (N/A ) BIOPSY  Patient location during evaluation: Phase II Anesthesia Type: General Level of consciousness: awake Pain management: pain level controlled Vital Signs Assessment: post-procedure vital signs reviewed and stable Respiratory status: spontaneous breathing and respiratory function stable Cardiovascular status: blood pressure returned to baseline and stable Postop Assessment: no headache and no apparent nausea or vomiting Anesthetic complications: no Comments: Late entry   No complications documented.   Last Vitals:  Vitals:   01/14/21 0750 01/14/21 0832  BP: 124/80 105/65  Pulse:  76  Resp: 12 16  Temp: 36.7 C   SpO2: 100% 99%    Last Pain:  Vitals:   01/14/21 0832  TempSrc:   PainSc: 0-No pain                 Windell Norfolk

## 2021-01-14 NOTE — Op Note (Signed)
[facility] Patient Name: Lindsay Lawrence Procedure Date: 01/14/2021 7:56 AM MRN: [id number] Date of Birth: 05-07-1956 Attending MD: Lionel December , MD CSN: [id number] Age: 65 Admit Type: Ambulatory Procedure:                Upper GI endoscopy Indications:              Epigastric abdominal pain, Esophageal dysphagia Providers:                Lionel December, MD, Brain Hilts, RN, Burke Keels, Technician Referring MD:             Ignatius Specking, MD Medicines:                Propofol per Anesthesia Complications:            No immediate complications. Estimated Blood Loss:     Estimated blood loss was minimal. Procedure:                Pre-Anesthesia Assessment:                           - Prior to the procedure, a History and Physical                            was performed, and patient medications and                            allergies were reviewed. The patient's tolerance of                            previous anesthesia was also reviewed. The risks                            and benefits of the procedure and the sedation                            options and risks were discussed with the patient.                            All questions were answered, and informed consent                            was obtained. Prior Anticoagulants: The patient has                            taken no previous anticoagulant or antiplatelet                            agents. ASA Grade Assessment: II - A patient with                            mild systemic disease. After reviewing the risks  and benefits, the patient was deemed in                            satisfactory condition to undergo the procedure.                           After obtaining informed consent, the endoscope was                            passed under direct vision. Throughout the                            procedure, the patient's blood pressure, pulse, and                             oxygen saturations were monitored continuously. The                            [id number]-H190 ([id number]) scope was introduced through the                            mouth, and advanced to the proximal jejunum. The                            upper GI endoscopy was accomplished without                            difficulty. The patient tolerated the procedure                            well. Scope In: 8:17:10 AM Scope Out: 8:25:08 AM Total Procedure Duration: 0 hours 7 minutes 58 seconds  Findings:      The hypopharynx was normal.      The examined esophagus was normal. Biopsies were taken with a cold       forceps for histology. The pathology specimen was placed into Bottle       Number 2.      The Z-line was regular and was found 40 cm from the incisors.      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 A gastric       pouch with a medium size was found containing food debris. The       gastroduodenal anastomosis was characterized by congestion, erythema and       friable mucosa. This was traversed. Biopsies were taken with a cold       forceps for histology. The pathology specimen was placed into Bottle       Number 1.      The cardia and gastric fundus were normal.      The duodenal bulb, second portion of the duodenum, third portion of the       duodenum and fourth portion of the duodenum were normal.      The examined jejunum was normal. Impression:               - Normal hypopharynx.                           -  Normal esophagus. Biopsied.                           - Z-line regular, 40 cm from the incisors.                           - Patent Billroth I gastroduodenostomy was found,                            characterized by congestion, erythema and friable                            mucosa. Biopsied.                           - Normal cardia and gastric fundus.                           - Normal duodenal bulb, second portion of the                             duodenum, third portion of the duodenum and fourth                            portion of the duodenum.                           - Normal examined jejunum.                           Comment: Esophagus was not dilated because of                            presence of large amount of food debris in the                            stomach.                           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 Moderate Sedation:      Per Anesthesia Care Recommendation:           - Patient has a contact number available for                            emergencies. The signs and symptoms of potential                            delayed complications were discussed with the                            patient. Return to normal activities tomorrow.                            Written discharge instructions were provided to the  patient.                           - Clear liquid diet today. Thereafter gastroparesis                            diet.                           - Continue present medications.                           - Await pathology results. Procedure Code(s):        --- Professional ---                           [phone], Esophagogastroduodenoscopy, flexible,                            transoral; with biopsy, single or multiple Diagnosis Code(s):        --- Professional ---                           Z98.0, Intestinal bypass and anastomosis status                           R10.13, Epigastric pain                           R13.14, Dysphagia, pharyngoesophageal phase CPT copyright 2019 American Medical Association. All rights reserved. The codes documented in this report are preliminary and upon coder review may  be revised to meet current compliance requirements. Lionel DecemberNajeeb Cherokee Clowers, MD Lionel DecemberNajeeb Nakari Bracknell, MD 01/14/2021 8:37:24 AM This report has been signed electronically. Number of Addenda: 0

## 2021-01-15 ENCOUNTER — Other Ambulatory Visit (INDEPENDENT_AMBULATORY_CARE_PROVIDER_SITE_OTHER): Payer: Self-pay | Admitting: Internal Medicine

## 2021-01-15 DIAGNOSIS — R110 Nausea: Secondary | ICD-10-CM

## 2021-01-15 LAB — SURGICAL PATHOLOGY

## 2021-01-15 NOTE — Telephone Encounter (Signed)
Noted to address with Dr. Karilyn Cota.

## 2021-01-19 ENCOUNTER — Other Ambulatory Visit (INDEPENDENT_AMBULATORY_CARE_PROVIDER_SITE_OTHER): Payer: Self-pay

## 2021-01-19 DIAGNOSIS — G8929 Other chronic pain: Secondary | ICD-10-CM

## 2021-01-20 ENCOUNTER — Encounter (HOSPITAL_COMMUNITY): Payer: Self-pay | Admitting: Internal Medicine

## 2021-01-27 ENCOUNTER — Ambulatory Visit (HOSPITAL_COMMUNITY)
Admission: RE | Admit: 2021-01-27 | Discharge: 2021-01-27 | Disposition: A | Discharge status: Home / Self Care | Payer: BC Managed Care – PPO | Source: Ambulatory Visit | Attending: Internal Medicine | Admitting: Internal Medicine

## 2021-01-27 ENCOUNTER — Encounter (HOSPITAL_COMMUNITY): Payer: Self-pay

## 2021-01-27 DIAGNOSIS — R1013 Epigastric pain: Secondary | ICD-10-CM | POA: Insufficient documentation

## 2021-01-27 DIAGNOSIS — G8929 Other chronic pain: Secondary | ICD-10-CM | POA: Diagnosis present

## 2021-01-27 MED ORDER — TECHNETIUM TC 99M SULFUR COLLOID
2.0000 | Freq: Once | INTRAVENOUS | Status: AC | PRN
  Administered 2021-01-27: 1.95 via ORAL

## 2021-02-04 ENCOUNTER — Other Ambulatory Visit (INDEPENDENT_AMBULATORY_CARE_PROVIDER_SITE_OTHER): Payer: Self-pay | Admitting: Internal Medicine

## 2021-02-04 MED ORDER — METOCLOPRAMIDE HCL 5 MG PO TABS: 5.0000 mg | ORAL_TABLET | Freq: Three times a day (TID) | ORAL | 0 refills | Status: DC

## 2021-02-16 ENCOUNTER — Other Ambulatory Visit (HOSPITAL_COMMUNITY): Payer: BC Managed Care – PPO

## 2021-03-03 ENCOUNTER — Other Ambulatory Visit (INDEPENDENT_AMBULATORY_CARE_PROVIDER_SITE_OTHER): Payer: Self-pay | Admitting: Internal Medicine

## 2021-03-03 DIAGNOSIS — F411 Generalized anxiety disorder: Secondary | ICD-10-CM

## 2021-03-03 NOTE — Telephone Encounter (Signed)
Will address with Dr.Rehman. 

## 2021-04-16 ENCOUNTER — Other Ambulatory Visit (INDEPENDENT_AMBULATORY_CARE_PROVIDER_SITE_OTHER): Payer: Self-pay | Admitting: Internal Medicine

## 2021-04-21 ENCOUNTER — Other Ambulatory Visit (INDEPENDENT_AMBULATORY_CARE_PROVIDER_SITE_OTHER): Payer: Self-pay | Admitting: Internal Medicine

## 2021-04-21 MED ORDER — DIPHENOXYLATE-ATROPINE 2.5-0.025 MG PO TABS: 1.0000 | ORAL_TABLET | Freq: Four times a day (QID) | ORAL | 2 refills | Status: DC | PRN

## 2021-04-21 NOTE — Telephone Encounter (Signed)
One refill per dr Karilyn Cota

## 2021-04-21 NOTE — Telephone Encounter (Signed)
Last seen for chronic diarrhea 12/30/20. Next appt 10/25

## 2021-05-04 ENCOUNTER — Other Ambulatory Visit (INDEPENDENT_AMBULATORY_CARE_PROVIDER_SITE_OTHER): Payer: Self-pay | Admitting: Internal Medicine

## 2021-05-04 DIAGNOSIS — F411 Generalized anxiety disorder: Secondary | ICD-10-CM

## 2021-05-04 NOTE — Telephone Encounter (Signed)
Will address with Dr.Rehman. 

## 2021-05-05 ENCOUNTER — Other Ambulatory Visit (INDEPENDENT_AMBULATORY_CARE_PROVIDER_SITE_OTHER): Payer: Self-pay | Admitting: Internal Medicine

## 2021-05-12 ENCOUNTER — Telehealth (INDEPENDENT_AMBULATORY_CARE_PROVIDER_SITE_OTHER): Payer: Self-pay | Admitting: *Deleted

## 2021-05-13 NOTE — Telephone Encounter (Signed)
error 

## 2021-05-25 ENCOUNTER — Telehealth: Payer: Self-pay | Admitting: Cardiology

## 2021-05-25 NOTE — Telephone Encounter (Signed)
error 

## 2021-06-02 ENCOUNTER — Telehealth (INDEPENDENT_AMBULATORY_CARE_PROVIDER_SITE_OTHER): Payer: Self-pay | Admitting: *Deleted

## 2021-06-02 NOTE — Telephone Encounter (Signed)
Pt went to [facility] last night because she started shaking. States she was told to follow up with GI today. She did call and get an appt with pcp for tomorrow at 8:30 Dr. Sherril Croon. States she was told she had inflammation of pancreas. States she does not feel well today. Still having the same pain across top of abdomen all the way across. Nausea and a headache. No fever. Took some zofran last night for nausea. Has not had to take any today.

## 2021-06-02 NOTE — Telephone Encounter (Signed)
Dr Karilyn Cota reviewed ER notes and wants pt to keep appt tomorrow with her pcp dr vyas and dr Karilyn Cota wants to see pt on 9/22 at 9 am. Pt called and notified and discussed with mitzie to add her to schedule 9/22 at 9am. Pt notified of appt also.

## 2021-06-04 ENCOUNTER — Other Ambulatory Visit: Payer: Self-pay

## 2021-06-04 ENCOUNTER — Encounter (INDEPENDENT_AMBULATORY_CARE_PROVIDER_SITE_OTHER): Payer: Self-pay | Admitting: Internal Medicine

## 2021-06-04 ENCOUNTER — Ambulatory Visit (INDEPENDENT_AMBULATORY_CARE_PROVIDER_SITE_OTHER): Payer: BC Managed Care – PPO | Admitting: Internal Medicine

## 2021-06-04 VITALS — BP 119/75 | HR 64 | Temp 98.4°F | Ht 64.0 in | Wt 107.2 lb

## 2021-06-04 DIAGNOSIS — R748 Abnormal levels of other serum enzymes: Secondary | ICD-10-CM

## 2021-06-04 DIAGNOSIS — R1013 Epigastric pain: Secondary | ICD-10-CM

## 2021-06-04 NOTE — Patient Instructions (Signed)
Physician will call with results of blood test when completed. 

## 2021-06-04 NOTE — Progress Notes (Signed)
Presenting complaint;  Epigastric pain nausea and elevated serum lipase  Database and subjective:  Patient is 65 year old Caucasian female who was seen in emergency room 2 days ago alcohol yesterday requested to be seen.  She was last seen in the office on 12/30/2020.  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  She also has a history of iron deficiency anemia which has corrected with p.o. iron.  He has a history of vitamin D deficiency which has never corrected with vitamin D even at higher dose.  She has undergone studies to rule out malabsorptive syndrome.  She received COVID-vaccine on 05/29/2021 and did not experience any side effects.  She received vaccine around 3:30 PM on 06/01/2021. Patient woke up in the middle of the night with myoclonic activity.  She shows me how her muscles are contracting.  She also noted pain in her lower chest upper abdomen and back felt nauseated and vomited once.  She did not have fever diaphoreses shortness of breath or skin rash.  Her husband took her to emergency room where she was seen around 2 AM.  Her heart rate was 111 blood pressure was normal and her temp was 98.2.  ECG was unremarkable.  Chest film was within normal limits.  Single view abdominal film revealed nonobstructive gas pattern.  Lab studies are pertinent for serum lipase of 204. See below for rest of the lab. She had abdominal pelvic CT with IV contrast revealing normal pancreas without any changes to suggest pancreatitis.  Symmetrical bilateral pararenal space fluid or edema was noted more prominent on the left but also noted on prior study of Jan 27, 2019 2016. Patient was treated and discharged and advised to follow-up with [place].  She has not had any more spells of myoclonic  activity or muscle jerking.  When she had her first spell she did not lose consciousness but was confused according to her husband. She continues to complain of pain in her epigastric region and nausea.  She she had 42 days ago but yesterday her appetite was back to normal.  She remains with 3-4 bowel movements per day.  She has chronic diarrhea felt to be due to IBS and perhaps an element of dumping syndrome given history of peptic ulcer disease.  She denies melena rectal bleeding hematemesis hematuria or dysuria.   Current Medications: Outpatient Encounter Medications as of 06/04/2021  Medication Sig   acetaminophen (TYLENOL) 500 MG tablet Take 500 mg by mouth every 6 (six) hours as needed for headache.   ALPRAZolam (XANAX) 0.25 MG tablet Take 1 tablet by mouth three times daily as needed   Azelastine HCl 0.15 % SOLN Place 1 spray into the nose daily as needed (sinus congestion).    Cholecalciferol (VITAMIN D3) 5000 UNITS CAPS Take 5,000 Units by mouth daily.    diphenoxylate-atropine (LOMOTIL) 2.5-0.025 MG tablet Take 1 tablet by mouth 4 (four) times daily as needed for diarrhea or loose stools.   ferrous gluconate (FERGON) 324 MG tablet Take 324 mg by mouth 2 (two) times a week. Tuesday & Friday   latanoprost (XALATAN) 0.005 % ophthalmic solution Place 1 drop into both eyes at bedtime.   metoprolol tartrate (LOPRESSOR) 25 MG tablet Take 1 tablet (25 mg total) by mouth 2 (two) times daily.   Multiple Vitamin (MULTIVITAMIN WITH MINERALS)  TABS tablet Take 1 tablet by mouth daily.   ondansetron (ZOFRAN) 4 MG tablet TAKE 1 TABLET BY MOUTH EVERY 12 HOURS AS NEEDED FOR NAUSEA   pantoprazole (PROTONIX) 40 MG tablet Take 1 tablet (40 mg total) by mouth daily before breakfast.   promethazine (PHENADOZ) 25 MG suppository Place 1 suppository (25 mg total) rectally 2 (two) times daily as needed for nausea or vomiting.   promethazine (PHENERGAN) 25 MG tablet Take 1 tablet (25 mg total) by mouth 2 (two) times  daily as needed for nausea or vomiting.   Simethicone (PHAZYME) 180 MG CAPS Take 1 capsule (180 mg total) by mouth 2 (two) times daily as needed. (Patient taking differently: Take 180 mg by mouth 2 (two) times daily as needed (bloating).)   sucralfate (CARAFATE) 1 g tablet Take 2 tablets (2 g total) by mouth at bedtime as needed (acid reflux).   vitamin B-12 (CYANOCOBALAMIN) 1000 MCG tablet Take 1,000 mcg by mouth daily.   zinc gluconate 50 MG tablet Take 50 mg by mouth daily.   cholestyramine (QUESTRAN) 4 g packet DISSOLVE & TAKE ONE POWDER BY MOUTH TWICE DAILY, TAKE 2 HOURS BEFORE OR AFTER TAKING OTHER MEDICINES (Patient not taking: Reported on 06/04/2021)   metoCLOPramide (REGLAN) 5 MG tablet Take 1 tablet (5 mg total) by mouth 3 (three) times daily before meals. (Patient not taking: Reported on 06/04/2021)   No facility-administered encounter medications on file as of 06/04/2021.     Objective: Blood pressure 119/75, pulse 64, temperature 98.4 F (36.9 C), temperature source Oral, height 5\' 4"  (1.626 m), weight 107 lb 3.2 oz (48.6 kg). Patient is alert and in no acute distress. Conjunctiva is pink. Sclera is nonicteric Oropharyngeal mucosa is normal. No neck masses or thyromegaly noted. Cardiac exam with regular rhythm normal S1 and S2. No murmur or gallop noted. Lungs are clear to auscultation. Abdomen is symmetrical.  She has upper midline scar.  Bowel sounds are normal.  On palpation abdomen is soft.  She has mild to moderate midepigastric tenderness.  No organomegaly or masses. No LE edema or clubbing noted.  Labs/studies Results:  Lab data from 06/02/2021 WBC 6.5, H&H 12.5 and 39.1 and platelet count 191K Differential revealed 95% neutrophils. High-sensitivity troponin  5  BUN 10, creatinine 0.79 Sodium 139, potassium 3.9, probably 1 or 2, CO2 30.2 Glucose glucose 177 Serum calcium 9.0 Bilirubin 0.5, AP 91, AST 22, ALT 23, total protein 7.1 albumin 3.9  Serum lipase  204.  TSH was 1.611.  Imaging studies results as above. I am unable to review images in the office.  Dr. 06/04/2021 was nice enough to review CT for me and compared with old study.  Bile duct distally measured 9 mm without choledocholithiasis.   Assessment:  #1.  Recent illness consisting of nausea vomiting chest and abdominal pain as well as myoclonic activity what appeared to be most likely reaction to flu vaccine.  She has not had similar reaction in the past and she has not had any since her episode on 06/02/2021.  No indication for further work-up unless symptoms recur.  #2.  Elevated serum lipase.  Pancreas is normal on CT.  This elevation would appear to be nonspecific or she could have very mild pancreatitis despite negative CT.  This may not be related to the vaccine.  No history of pancreatitis in the past.  She is on her gallbladder removed.  She does not drink alcohol.  Will repeat amylase and lipase and go from there.  #  3.  Epigastric pain.  She has chronic epigastric pain which appears to been exacerbated by her recent illness.  We will continue symptomatic therapy and check LFTs.   Plan:  Medication list updated. Patient reassured. If myoclonic activity return she will need neurologic consultation. She will go to the lab for LFTs serum amylase and lipase. Office visit on 07/07/2021.

## 2021-06-05 LAB — HEPATIC FUNCTION PANEL
AG Ratio: 1.7 (calc) (ref 1.0–2.5)
ALT: 13 U/L (ref 6–29)
AST: 17 U/L (ref 10–35)
Albumin: 4.1 g/dL (ref 3.6–5.1)
Alkaline phosphatase (APISO): 77 U/L (ref 37–153)
Bilirubin, Direct: 0.1 mg/dL (ref 0.0–0.2)
Globulin: 2.4 g/dL (calc) (ref 1.9–3.7)
Indirect Bilirubin: 0.3 mg/dL (calc) (ref 0.2–1.2)
Total Bilirubin: 0.4 mg/dL (ref 0.2–1.2)
Total Protein: 6.5 g/dL (ref 6.1–8.1)

## 2021-06-05 LAB — AMYLASE: Amylase: 54 U/L (ref 21–101)

## 2021-06-05 LAB — LIPASE: Lipase: 46 U/L (ref 7–60)

## 2021-06-30 ENCOUNTER — Ambulatory Visit (INDEPENDENT_AMBULATORY_CARE_PROVIDER_SITE_OTHER): Payer: BC Managed Care – PPO | Admitting: Internal Medicine

## 2021-07-07 ENCOUNTER — Encounter (INDEPENDENT_AMBULATORY_CARE_PROVIDER_SITE_OTHER): Payer: Self-pay | Admitting: Internal Medicine

## 2021-07-07 ENCOUNTER — Ambulatory Visit (INDEPENDENT_AMBULATORY_CARE_PROVIDER_SITE_OTHER): Payer: BC Managed Care – PPO | Admitting: Internal Medicine

## 2021-07-07 ENCOUNTER — Other Ambulatory Visit: Payer: Self-pay

## 2021-07-07 VITALS — BP 119/82 | HR 69 | Temp 98.3°F | Ht 64.0 in | Wt 109.6 lb

## 2021-07-07 DIAGNOSIS — E559 Vitamin D deficiency, unspecified: Secondary | ICD-10-CM | POA: Diagnosis not present

## 2021-07-07 DIAGNOSIS — F411 Generalized anxiety disorder: Secondary | ICD-10-CM

## 2021-07-07 DIAGNOSIS — K529 Noninfective gastroenteritis and colitis, unspecified: Secondary | ICD-10-CM

## 2021-07-07 DIAGNOSIS — F419 Anxiety disorder, unspecified: Secondary | ICD-10-CM

## 2021-07-07 DIAGNOSIS — Z8639 Personal history of other endocrine, nutritional and metabolic disease: Secondary | ICD-10-CM | POA: Insufficient documentation

## 2021-07-07 DIAGNOSIS — K219 Gastro-esophageal reflux disease without esophagitis: Secondary | ICD-10-CM

## 2021-07-07 MED ORDER — ALPRAZOLAM 0.25 MG PO TABS: 0.2500 mg | ORAL_TABLET | Freq: Three times a day (TID) | ORAL | 0 refills | Status: DC | PRN

## 2021-07-07 MED ORDER — METOCLOPRAMIDE HCL 5 MG PO TABS: 5.0000 mg | ORAL_TABLET | Freq: Three times a day (TID) | ORAL | 0 refills | Status: DC

## 2021-07-07 MED ORDER — PANTOPRAZOLE SODIUM 40 MG PO TBEC: 40.0000 mg | DELAYED_RELEASE_TABLET | Freq: Every day | ORAL | 3 refills | Status: DC

## 2021-07-07 NOTE — Patient Instructions (Signed)
Physician will call with results of blood test when completed. Take metoclopramide 5 mg by mouth 30 minutes before breakfast and evening meal daily.  If this dose does not help then increase it to 3 times a day.  Please call with progress report in 2 weeks.

## 2021-07-07 NOTE — Progress Notes (Signed)
Presenting complaint;  Follow-up for multiple problems.  Database and subjective:  Patient is 65 year old Caucasian female with chronic GERD history of peptic ulcer disease status post truncal vagotomy with antrectomy November 2014 at [facility] post vagotomy gastroparesis chronic diarrhea epigastric pain anxiety iron deficiency and vitamin D deficiency was here for scheduled visit. She was seen in the office on 06/04/2021 2 days after having been seen in emergency room for abdominal pain and elevated lipase.  It was felt she had a reaction to flu vaccine.  Patient says she has not felt well since then.  She has daily epigastric pain associated with nausea which is worse after eating.  At times this pain doubles her over.  She states she has had 5 bad spells this month but she never threw up.  She says heartburn is well controlled with pantoprazole.  She remains with diarrhea.  She has 3-4 bowel movements per day.  She has occasional accidents.  She had one 2 weeks ago.  She may have 1 or 2 episodes of nocturnal bowel movement per month.  She denies melena or rectal bleeding.  She remains with intermittent dysphagia with certain foods but she has not had food impaction.  She is eating ice.  She wonders if her iron stores decreased again.  She had blood work by Dr. Woody Seller recently but iron studies were not checked.  She also wants to know her vitamin D2 level. She is also requesting prescription for alprazolam and pantoprazole.  Current Medications: Outpatient Encounter Medications as of 07/07/2021  Medication Sig   acetaminophen (TYLENOL) 500 MG tablet Take 500 mg by mouth every 6 (six) hours as needed for headache.   ALPRAZolam (XANAX) 0.25 MG tablet Take 1 tablet by mouth three times daily as needed   Azelastine HCl 0.15 % SOLN Place 1 spray into the nose daily as needed (sinus congestion).    brimonidine (ALPHAGAN) 0.2 % ophthalmic solution Place 1 drop into the right eye 2 (two) times daily.    Cholecalciferol (VITAMIN D3) 5000 UNITS CAPS Take 5,000 Units by mouth daily.    diphenoxylate-atropine (LOMOTIL) 2.5-0.025 MG tablet Take 1 tablet by mouth 4 (four) times daily as needed for diarrhea or loose stools.   ferrous gluconate (FERGON) 324 MG tablet Take 324 mg by mouth daily at 6 (six) AM.   latanoprost (XALATAN) 0.005 % ophthalmic solution Place 1 drop into both eyes at bedtime.   metoprolol tartrate (LOPRESSOR) 25 MG tablet Take 1 tablet (25 mg total) by mouth 2 (two) times daily.   Multiple Vitamin (MULTIVITAMIN WITH MINERALS) TABS tablet Take 1 tablet by mouth daily.   ondansetron (ZOFRAN) 4 MG tablet TAKE 1 TABLET BY MOUTH EVERY 12 HOURS AS NEEDED FOR NAUSEA   pantoprazole (PROTONIX) 40 MG tablet Take 1 tablet (40 mg total) by mouth daily before breakfast.   promethazine (PHENADOZ) 25 MG suppository Place 1 suppository (25 mg total) rectally 2 (two) times daily as needed for nausea or vomiting.   promethazine (PHENERGAN) 25 MG tablet Take 1 tablet (25 mg total) by mouth 2 (two) times daily as needed for nausea or vomiting.   rosuvastatin (CRESTOR) 5 MG tablet Take 5 mg by mouth once a week.   Simethicone (PHAZYME) 180 MG CAPS Take 1 capsule (180 mg total) by mouth 2 (two) times daily as needed. (Patient taking differently: Take 180 mg by mouth 2 (two) times daily as needed (bloating).)   sucralfate (CARAFATE) 1 g tablet Take 2 tablets (2 g total)  by mouth at bedtime as needed (acid reflux).   vitamin B-12 (CYANOCOBALAMIN) 1000 MCG tablet Take 1,000 mcg by mouth daily.   zinc gluconate 50 MG tablet Take 50 mg by mouth daily.   cholestyramine (QUESTRAN) 4 g packet DISSOLVE & TAKE ONE POWDER BY MOUTH TWICE DAILY, TAKE 2 HOURS BEFORE OR AFTER TAKING OTHER MEDICINES (Patient not taking: No sig reported)   No facility-administered encounter medications on file as of 07/07/2021.     Objective: Blood pressure 119/82, pulse 69, temperature 98.3 F (36.8 C), temperature source Oral,  height _0  (1.626 m), weight 109 lb 9.6 oz (49.7 kg). Thin Caucasian female in NAD. Conjunctiva is pink. Sclera is nonicteric Oropharyngeal mucosa is normal. No neck masses or thyromegaly noted. Cardiac exam with regular rhythm normal S1 and S2. No murmur or gallop noted. Lungs are clear to auscultation. Abdomen is symmetrical with upper midline scar.  On palpation abdomen is soft.  She remains with mild tenderness in midepigastric region as well as LLQ.  No organomegaly or masses. No LE edema or clubbing noted.  Labs/studies Results:   CBC Latest Ref Rng & Units 12/31/2019 01/10/2018 07/12/2017  WBC 3.8 - 10.8 Thousand/uL 4.6 5.1 6.0  Hemoglobin 11.7 - 15.5 g/dL 12.0 12.4 12.7  Hematocrit 35.0 - 45.0 % 36.7 36.9 37.7  Platelets 140 - 400 Thousand/uL 208 199 181    CMP Latest Ref Rng & Units 06/04/2021 12/31/2019 02/28/2017  Glucose 65 - 139 mg/dL - 99 106(H)  BUN 7 - 25 mg/dL - 8 11  Creatinine 0.50 - 0.99 mg/dL - 0.76 0.77  Sodium 135 - 146 mmol/L - 140 139  Potassium 3.5 - 5.3 mmol/L - 4.2 4.0  Chloride 98 - 110 mmol/L - 103 101  CO2 20 - 32 mmol/L - 29 30  Calcium 8.6 - 10.4 mg/dL - 9.1 9.4  Total Protein 6.1 - 8.1 g/dL 6.5 6.2 -  Total Bilirubin 0.2 - 1.2 mg/dL 0.4 0.4 -  Alkaline Phos 33 - 130 U/L - - -  AST 10 - 35 U/L 17 16 -  ALT 6 - 29 U/L 13 13 -    Hepatic Function Latest Ref Rng & Units 06/04/2021 12/31/2019 01/04/2017  Total Protein 6.1 - 8.1 g/dL 6.5 6.2 5.9(L)  Albumin 3.6 - 5.1 g/dL - - 3.9  AST 10 - 35 U/L _1 ALT 6 - 29 U/L _2 Alk Phosphatase 33 - 130 U/L - - 55  Total Bilirubin 0.2 - 1.2 mg/dL 0.4 0.4 0.4  Bilirubin, Direct 0.0 - 0.2 mg/dL 0.1 - -    Lab data from 06/29/2021(Dr. Vyas's office) WBC 4.6 H&H 12.3 and 37.2. MCV 87. Platelet count 189K.  TSH 1.210  Glucose 103 BUN 7 and creatinine 0.77 Serum sodium 143, potassium 4.3, chloride 101, CO2 25 Serum calcium 8.9 Bilirubin 0.5, AP 98, AST 18, ALT 12, total protein 6.2 and albumin  4.3.  Assessment:  #1.  Epigastric pain.  She has a history of peptic ulcer disease for which she underwent truncal vagotomy and antrectomy in November 2014.  She has had multiple EGDs since then and there has been no evidence of recurrent peptic ulcer disease.  EGD in May 2022 revealed large amount of food debris in the stomach.  I recommended trying low-dose metoclopramide but she decided not to after reading list of side effects.  Now that her pain has gotten worse she is willing to try.  #2.  Anxiety resulting  from chronic illness with multiple symptoms.  She is on alprazolam.  Prescription will be refilled.  #3.  Chronic diarrhea felt to be secondary to gastric surgery and IBS.  She is on low-dose diphenoxylate.  This therapy may be making her gastroparesis worse.  #4.  History of iron deficiency anemia.  Recent H&H is normal.  Will check serum ferritin to make sure she has adequate iron stores.  #5.  History of vitamin D deficiency.  She has never responded even to high-dose vitamin D.  Parenteral therapy was too expensive.  She remains on p.o. supplement.   Plan:  Alprazolam 0.25 mg 3 times daily as needed 90 doses with 0 refills. Pantoprazole 40 mg by mouth 30 minutes before breakfast daily prescription sent for 90 days with 3 refills. Patient will go to the lab for vitamin D2 and serum ferritin level. Patient advised to try metoclopramide 5 mg 30 minutes before breakfast and evening meal daily.  If this dose does not work and she has no side effects she can try taking it 3 times a day. Patient will call with progress report in 2 weeks.

## 2021-07-08 LAB — VITAMIN D 25 HYDROXY (VIT D DEFICIENCY, FRACTURES): Vit D, 25-Hydroxy: 20 ng/mL — ABNORMAL LOW (ref 30–100)

## 2021-07-08 LAB — FERRITIN: Ferritin: 10 ng/mL — ABNORMAL LOW (ref 16–288)

## 2021-07-20 ENCOUNTER — Encounter: Payer: Self-pay | Admitting: *Deleted

## 2021-07-20 ENCOUNTER — Telehealth: Payer: Self-pay | Admitting: Cardiology

## 2021-07-20 NOTE — Telephone Encounter (Signed)
Patient returning call.

## 2021-07-20 NOTE — Telephone Encounter (Signed)
Patient states that pcp wants her to start Crestor 5mg  - one tab weekly.  Wants your opinion.    Lab results given per patient below:    Total cholesterol - 236 Triglycerides - 75 LDL - 103 HDL - 120 VLDL - 13  I will request official report as well.

## 2021-07-20 NOTE — Telephone Encounter (Signed)
Pt's PCP started her on Crestor and she has a few concerns. She'd like someone to please call her.   Please call [phone]

## 2021-07-20 NOTE — Telephone Encounter (Signed)
Left message to return call 

## 2021-07-21 NOTE — Telephone Encounter (Signed)
Left message to return call 

## 2021-07-21 NOTE — Telephone Encounter (Signed)
Patient returned call

## 2021-07-22 NOTE — Telephone Encounter (Signed)
Patient informed and verbalized understanding of plan. 

## 2021-07-22 NOTE — Telephone Encounter (Signed)
Patient is returning call.  °

## 2021-07-24 ENCOUNTER — Encounter (INDEPENDENT_AMBULATORY_CARE_PROVIDER_SITE_OTHER): Payer: Self-pay

## 2021-08-08 ENCOUNTER — Other Ambulatory Visit: Payer: Self-pay | Admitting: Cardiology

## 2021-09-02 ENCOUNTER — Other Ambulatory Visit (INDEPENDENT_AMBULATORY_CARE_PROVIDER_SITE_OTHER): Payer: Self-pay | Admitting: Internal Medicine

## 2021-09-02 DIAGNOSIS — F411 Generalized anxiety disorder: Secondary | ICD-10-CM

## 2021-09-02 NOTE — Telephone Encounter (Signed)
Seen 07/07/21 for anxiety

## 2021-09-08 ENCOUNTER — Encounter (HOSPITAL_COMMUNITY)
Admission: RE | Admit: 2021-09-08 | Discharge: 2021-09-08 | Disposition: A | Discharge status: Home / Self Care | Payer: BC Managed Care – PPO | Source: Ambulatory Visit | Attending: Ophthalmology | Admitting: Ophthalmology

## 2021-09-08 NOTE — H&P (Signed)
Surgical History & Physical  Patient Name: Lindsay Lawrence DOB: Mar 21, 1956  Surgery: Cataract extraction with intraocular lens implant phacoemulsification; Left Eye  Surgeon: Fabio Pierce MD Surgery Date:  09-11-21 Pre-Op Date:  08-20-21  HPI: A 89 Yr. old female patient Pt referred by Dr. Earlene Plater for cataract evaluation. The patient complains of difficulty when driving, which began many years ago. Both eyes are affected. The episode is gradual. The condition's severity is worsening. The complaint is associated with blurry vision, glare, tearing, burning and halos. Symptoms are negatively affecting pt's' quality of life. Pt states she has had elevated IOP OD. Pt was treated in the past with Latanoprost qhs OD with NI. Pt now taking brimonidine BID OD as well. Pt denies any eye pain or increase in floaters/flashes of light. HPI was performed by Fabio Pierce .  Medical History: Dry Eyes Glaucoma Cataracts Ptosis, Corneal scar, MGD, Myopia, Asigmatism, Presbyopia Macula Degeneration Glaucoma Arthritis Diabetes Heart Problem High Blood Pressure IBS, GERD, Hx of Shingles  Review of Systems Negative Allergic/Immunologic Negative Cardiovascular Negative Constitutional Negative Ear, Nose, Mouth & Throat Negative Endocrine Negative Eyes Negative Gastrointestinal Negative Genitourinary Negative Hemotologic/Lymphatic Negative Integumentary Negative Musculoskeletal Negative Neurological Negative Psychiatry Negative Respiratory  Social   Never smoked   Medication Brimonidine, Lumigan, Alprazolam, Cholestryamine, Ferrous gluconate, Lomotil, Metoprolol, Multivitamin, Vitamin D3, Pantoprazole, Simetichone, Sucralfate, Zofran,   Sx/Procedures Ulcer sx, Gallbladder Removal, Hysterectomy,   Drug Allergies  Aspirin, Codeine, Fentanyl, Nsaid drugs, PROPOXYPHENE NAPSYLATE,   History & Physical: Heent: Cataract, Left Eye NECK: supple without bruits LUNGS: lungs clear to  auscultation CV: regular rate and rhythm Abdomen: soft and non-tender  Impression & Plan: Assessment: 1.  COMBINED FORMS AGE RELATED CATARACT; Both Eyes (H25.813) 2.  BLEPHARITIS; Right Upper Lid, Right Lower Lid, Left Upper Lid, Left Lower Lid (H01.001, H01.002,H01.004,H01.005) 3.  ASTIGMATISM, REGULAR; Both Eyes (H52.223) 4.  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  Cataract accounts for the patient's decreased vision. This visual impairment is not correctable with a tolerable change in glasses or contact lenses. Cataract surgery with an implantation of a new lens should significantly improve the visual and functional status of the patient. Discussed all risks, benefits, alternatives, and potential complications. Discussed the procedures and recovery. Patient desires to have surgery. A-scan ordered and performed today for intra-ocular lens calculations. The surgery will be performed in order to improve vision for driving, reading, and for eye examinations. Recommend phacoemulsification with intra-ocular lens. Recommend Dextenza for post-operative pain and inflammation. Left Eye worse vision, first. Dilates well - shugarcaine by protocol.  2.  Recommend regular lid cleaning  3.  Consider toric IOL OD.  4.  Very elevated IOP OD today. OCT rNFL WNL at this point. Detailed discussion about glaucoma today including importance of maintaining good follow up and following treatment plan, and the possibility of irreversible blindness as part of this disease process. Increase Brimonidine to 3x/day. Switch latanoprost to brand name Lumigan 1 drop every night at bedtime, both eyes Given elevated IOP - recommend SLT OD.

## 2021-09-09 ENCOUNTER — Other Ambulatory Visit: Payer: Self-pay

## 2021-09-09 ENCOUNTER — Encounter (HOSPITAL_COMMUNITY): Payer: Self-pay

## 2021-09-11 ENCOUNTER — Ambulatory Visit (HOSPITAL_COMMUNITY): Payer: BC Managed Care – PPO | Admitting: Anesthesiology

## 2021-09-11 ENCOUNTER — Encounter (HOSPITAL_COMMUNITY)
Admission: RE | Disposition: A | Discharge status: Home / Self Care | Payer: Self-pay | Source: Home / Self Care | Attending: Ophthalmology

## 2021-09-11 ENCOUNTER — Encounter (HOSPITAL_COMMUNITY): Payer: Self-pay | Admitting: Ophthalmology

## 2021-09-11 ENCOUNTER — Ambulatory Visit (HOSPITAL_COMMUNITY)
Admission: RE | Admit: 2021-09-11 | Discharge: 2021-09-11 | Disposition: A | Discharge status: Home / Self Care | Payer: BC Managed Care – PPO | Attending: Ophthalmology | Admitting: Ophthalmology

## 2021-09-11 DIAGNOSIS — H52223 Regular astigmatism, bilateral: Secondary | ICD-10-CM | POA: Insufficient documentation

## 2021-09-11 DIAGNOSIS — H401411 Capsular glaucoma with pseudoexfoliation of lens, right eye, mild stage: Secondary | ICD-10-CM | POA: Diagnosis not present

## 2021-09-11 DIAGNOSIS — E1136 Type 2 diabetes mellitus with diabetic cataract: Secondary | ICD-10-CM | POA: Insufficient documentation

## 2021-09-11 DIAGNOSIS — H0100A Unspecified blepharitis right eye, upper and lower eyelids: Secondary | ICD-10-CM | POA: Diagnosis not present

## 2021-09-11 DIAGNOSIS — H25813 Combined forms of age-related cataract, bilateral: Secondary | ICD-10-CM | POA: Diagnosis not present

## 2021-09-11 DIAGNOSIS — H0100B Unspecified blepharitis left eye, upper and lower eyelids: Secondary | ICD-10-CM | POA: Insufficient documentation

## 2021-09-11 HISTORY — PX: CATARACT EXTRACTION W/PHACO: SHX586

## 2021-09-11 SURGERY — PHACOEMULSIFICATION, CATARACT, WITH IOL INSERTION
Anesthesia: Monitor Anesthesia Care | Site: Eye | Laterality: Left

## 2021-09-11 MED ORDER — STERILE WATER FOR IRRIGATION IR SOLN
Status: DC | PRN
  Administered 2021-09-11: 250 mL

## 2021-09-11 MED ORDER — LIDOCAINE HCL 3.5 % OP GEL
1.0000 "application " | Freq: Once | OPHTHALMIC | Status: AC
  Administered 2021-09-11: 1 via OPHTHALMIC

## 2021-09-11 MED ORDER — LIDOCAINE HCL (PF) 1 % IJ SOLN
INTRAOCULAR | Status: DC | PRN
  Administered 2021-09-11: 10:00:00 1 mL via OPHTHALMIC

## 2021-09-11 MED ORDER — MIDAZOLAM HCL 2 MG/2ML IJ SOLN
INTRAMUSCULAR | Status: AC
  Filled 2021-09-11: qty 2

## 2021-09-11 MED ORDER — NEOMYCIN-POLYMYXIN-DEXAMETH 3.5-10000-0.1 OP SUSP
OPHTHALMIC | Status: DC | PRN
  Administered 2021-09-11: 1 [drp] via OPHTHALMIC

## 2021-09-11 MED ORDER — EPINEPHRINE PF 1 MG/ML IJ SOLN
INTRAMUSCULAR | Status: AC
  Filled 2021-09-11: qty 1

## 2021-09-11 MED ORDER — EPINEPHRINE PF 1 MG/ML IJ SOLN
INTRAOCULAR | Status: DC | PRN
  Administered 2021-09-11: 10:00:00 500 mL

## 2021-09-11 MED ORDER — SODIUM CHLORIDE 0.9% FLUSH
INTRAVENOUS | Status: DC | PRN
  Administered 2021-09-11: 5 mL via INTRAVENOUS

## 2021-09-11 MED ORDER — TETRACAINE HCL 0.5 % OP SOLN
1.0000 [drp] | OPHTHALMIC | Status: AC | PRN
  Administered 2021-09-11 (×3): 1 [drp] via OPHTHALMIC

## 2021-09-11 MED ORDER — TROPICAMIDE 1 % OP SOLN
1.0000 [drp] | OPHTHALMIC | Status: AC | PRN
  Administered 2021-09-11 (×3): 1 [drp] via OPHTHALMIC
  Filled 2021-09-11: qty 2

## 2021-09-11 MED ORDER — SODIUM HYALURONATE 23MG/ML IO SOSY
PREFILLED_SYRINGE | INTRAOCULAR | Status: DC | PRN
  Administered 2021-09-11: 0.6 mL via INTRAOCULAR

## 2021-09-11 MED ORDER — MIDAZOLAM HCL 5 MG/5ML IJ SOLN
INTRAMUSCULAR | Status: DC | PRN
  Administered 2021-09-11: 2 mg via INTRAVENOUS

## 2021-09-11 MED ORDER — PHENYLEPHRINE HCL 2.5 % OP SOLN
1.0000 [drp] | OPHTHALMIC | Status: AC | PRN
  Administered 2021-09-11 (×3): 1 [drp] via OPHTHALMIC

## 2021-09-11 MED ORDER — POVIDONE-IODINE 5 % OP SOLN
OPHTHALMIC | Status: DC | PRN
  Administered 2021-09-11: 1 via OPHTHALMIC

## 2021-09-11 MED ORDER — BSS IO SOLN
INTRAOCULAR | Status: DC | PRN
  Administered 2021-09-11: 15 mL via INTRAOCULAR

## 2021-09-11 MED ORDER — SODIUM HYALURONATE 10 MG/ML IO SOLUTION
PREFILLED_SYRINGE | INTRAOCULAR | Status: DC | PRN
  Administered 2021-09-11: 0.85 mL via INTRAOCULAR

## 2021-09-11 SURGICAL SUPPLY — 12 items
CATARACT SUITE SIGHTPATH (MISCELLANEOUS) ×1 IMPLANT
CLOTH BEACON ORANGE TIMEOUT ST (SAFETY) ×1 IMPLANT
EYE SHIELD UNIVERSAL CLEAR (GAUZE/BANDAGES/DRESSINGS) ×1 IMPLANT
GLOVE SURG UNDER POLY LF SZ7 (GLOVE) ×2 IMPLANT
NDL HYPO 18GX1.5 BLUNT FILL (NEEDLE) IMPLANT
NEEDLE HYPO 18GX1.5 BLUNT FILL (NEEDLE) ×2 IMPLANT
PAD ARMBOARD 7.5X6 YLW CONV (MISCELLANEOUS) ×1 IMPLANT
RayOne EMV US (Intraocular Lens) ×1 IMPLANT
SYR TB 1ML LL NO SAFETY (SYRINGE) ×1 IMPLANT
TAPE SURG TRANSPORE 1 IN (GAUZE/BANDAGES/DRESSINGS) IMPLANT
TAPE SURGICAL TRANSPORE 1 IN (GAUZE/BANDAGES/DRESSINGS) ×2
WATER STERILE IRR 250ML POUR (IV SOLUTION) ×1 IMPLANT

## 2021-09-11 NOTE — Interval H&P Note (Signed)
History and Physical Interval Note:  09/11/2021 9:56 AM  Lindsay Lawrence  has presented today for surgery, with the diagnosis of combined forms age related cataract,left eye.  The various methods of treatment have been discussed with the patient and family. After consideration of risks, benefits and other options for treatment, the patient has consented to  Procedure(s) with comments: CATARACT EXTRACTION PHACO AND INTRAOCULAR LENS PLACEMENT (IOC) (Left) - left, pt to arrive at 8:30 as a surgical intervention.  The patient's history has been reviewed, patient examined, no change in status, stable for surgery.  I have reviewed the patient's chart and labs.  Questions were answered to the patient's satisfaction.     Fabio Pierce

## 2021-09-11 NOTE — Transfer of Care (Signed)
Immediate Anesthesia Transfer of Care Note  Patient: Lindsay Lawrence  Procedure(s) Performed: CATARACT EXTRACTION PHACO AND INTRAOCULAR LENS PLACEMENT (IOC) (Left: Eye)  Patient Location: Short Stay  Anesthesia Type:MAC  Level of Consciousness: awake  Airway & Oxygen Therapy: Patient Spontanous Breathing  Post-op Assessment: Report given to RN  Post vital signs: Reviewed and stable  Last Vitals:  Vitals Value Taken Time  BP 108/68 09/11/21 1027  Temp 36.8 C 09/11/21 1027  Pulse 56 09/11/21 1027  Resp 17 09/11/21 1027  SpO2 99 % 09/11/21 1027    Last Pain:  Vitals:   09/11/21 1027  TempSrc: Oral  PainSc: 0-No pain         Complications: No notable events documented.

## 2021-09-11 NOTE — Anesthesia Preprocedure Evaluation (Addendum)
Anesthesia Evaluation  Patient identified by MRN, date of birth, ID band Patient awake    Reviewed: Allergy & Precautions, H&P , NPO status , Patient's Chart, lab work & pertinent test results, reviewed documented beta blocker date and time   Airway Mallampati: II  TM Distance: >3 FB Neck ROM: full    Dental no notable dental hx.    Pulmonary shortness of breath,    Pulmonary exam normal breath sounds clear to auscultation       Cardiovascular Exercise Tolerance: Good negative cardio ROS   Rhythm:regular Rate:Normal     Neuro/Psych PSYCHIATRIC DISORDERS Anxiety negative neurological ROS     GI/Hepatic Neg liver ROS, PUD, GERD  Medicated,  Endo/Other  negative endocrine ROS  Renal/GU negative Renal ROS  negative genitourinary   Musculoskeletal   Abdominal   Peds  Hematology  (+) Blood dyscrasia, anemia ,   Anesthesia Other Findings   Reproductive/Obstetrics negative OB ROS                             Anesthesia Physical Anesthesia Plan  ASA: 2  Anesthesia Plan: MAC   Post-op Pain Management:    Induction:   PONV Risk Score and Plan:   Airway Management Planned:   Additional Equipment:   Intra-op Plan:   Post-operative Plan:   Informed Consent: I have reviewed the patients History and Physical, chart, labs and discussed the procedure including the risks, benefits and alternatives for the proposed anesthesia with the patient or authorized representative who has indicated his/her understanding and acceptance.     Dental Advisory Given  Plan Discussed with: CRNA  Anesthesia Plan Comments:        Anesthesia Quick Evaluation

## 2021-09-11 NOTE — Op Note (Signed)
Date of procedure: 09/11/21  Pre-operative diagnosis: Visually significant age-related combined cataract, Left Eye (H25.812)  Post-operative diagnosis: Visually significant age-related combined cataract, Left Eye (H25.812)  Procedure: Removal of cataract via phacoemulsification and insertion of intra-ocular lens Rayner [id number] +19.0D into the capsular bag of the Left Eye  Attending surgeon: Gerda Diss. Javier Mamone, MD, MA  Anesthesia: MAC, Topical Akten  Complications: None  Estimated Blood Loss: <60m (minimal)  Specimens: None  Implants: As above  Indications:  Visually significant age-related cataract, Left Eye  Procedure:  The patient was seen and identified in the pre-operative area. The operative eye was identified and dilated.  The operative eye was marked.  Topical anesthesia was administered to the operative eye.     The patient was then to the operative suite and placed in the supine position.  A timeout was performed confirming the patient, procedure to be performed, and all other relevant information.   The patient's face was prepped and draped in the usual fashion for intra-ocular surgery.  A lid speculum was placed into the operative eye and the surgical microscope moved into place and focused.  An inferotemporal paracentesis was created using a 20 gauge paracentesis blade.  Shugarcaine was injected into the anterior chamber.  Viscoelastic was injected into the anterior chamber.  A temporal clear-corneal main wound incision was created using a 2.434mmicrokeratome.  A continuous curvilinear capsulorrhexis was initiated using an irrigating cystitome and completed using capsulorrhexis forceps.  Hydrodissection and hydrodeliniation were performed.  Viscoelastic was injected into the anterior chamber.  A phacoemulsification handpiece and a chopper as a second instrument were used to remove the nucleus and epinucleus. The irrigation/aspiration handpiece was used to remove any remaining  cortical material.   The capsular bag was reinflated with viscoelastic, checked, and found to be intact.  The intraocular lens was inserted into the capsular bag.  The irrigation/aspiration handpiece was used to remove any remaining viscoelastic.  The clear corneal wound and paracentesis wounds were then hydrated and checked with Weck-Cels to be watertight.  The lid-speculum was removed.  The drape was removed.  The patient's face was cleaned with a wet and dry 4x4.   Maxitrol was instilled in the eye. A clear shield was taped over the eye. The patient was taken to the post-operative care unit in good condition, having tolerated the procedure well.  Post-Op Instructions: [facility] and will receive all other orders and instructions.

## 2021-09-11 NOTE — Discharge Instructions (Addendum)
Please discharge patient when stable, will follow up today with Dr. Wrzosek at the [facility] [place] office immediately following discharge.  Leave shield in place until visit.  All paperwork with discharge instructions will be given at the office.  [facility] Address:  [address]  [place], [place] [address]  

## 2021-09-11 NOTE — Anesthesia Postprocedure Evaluation (Signed)
Anesthesia Post Note  Patient: Lindsay Lawrence  Procedure(s) Performed: CATARACT EXTRACTION PHACO AND INTRAOCULAR LENS PLACEMENT (IOC) (Left: Eye)  Patient location during evaluation: Short Stay Anesthesia Type: MAC Level of consciousness: awake and alert Pain management: pain level controlled Vital Signs Assessment: post-procedure vital signs reviewed and stable Respiratory status: spontaneous breathing Cardiovascular status: blood pressure returned to baseline Postop Assessment: no apparent nausea or vomiting Anesthetic complications: no   No notable events documented.   Last Vitals:  Vitals:   09/11/21 0853 09/11/21 1027  BP: 112/70 108/68  Pulse: (!) 57 (!) 56  Resp: 13 17  Temp: 36.7 C 36.8 C  SpO2: 100% 99%    Last Pain:  Vitals:   09/11/21 1027  TempSrc: Oral  PainSc: 0-No pain                 Gaje Tennyson

## 2021-09-15 ENCOUNTER — Encounter (HOSPITAL_COMMUNITY): Payer: Self-pay | Admitting: Ophthalmology

## 2021-10-19 ENCOUNTER — Encounter (HOSPITAL_COMMUNITY)
Admission: RE | Admit: 2021-10-19 | Discharge: 2021-10-19 | Disposition: A | Discharge status: Home / Self Care | Payer: BC Managed Care – PPO | Source: Ambulatory Visit | Attending: Ophthalmology | Admitting: Ophthalmology

## 2021-10-21 NOTE — H&P (Signed)
Surgical History & Physical  Patient Name: Lindsay Lawrence DOB: 05-08-1956  Surgery: Cataract extraction with intraocular lens implant phacoemulsification & Goniotomy Treatment (iAccess); Right Eye  Surgeon: Fabio Pierce MD Surgery Date:  10-26-21 Pre-Op Date:  10-19-21  HPI: A 63 Yr. old female patient The patient is returning after cataract surgery. The left eye is affected. Status post cataract surgery, which began 13 days ago: 09/11/21. Since the last visit, the affected area is doing well. The patient's vision is improved. Patient is following medication instructions. Pt taking Ilevro qday OS, Pred BID OS, Brimonidine TID OD and Xelpros qhs OD. Pt states she has noticed some increase in flashes of light, but stable, not worsening. Pt denies any eye pain or increase in floaters. The patient complains of difficulty when driving, which began many years ago. The right eye is affected. The episode is gradual. The condition's severity is worsening. The complaint is associated with blurry vision, glare, tearing, burning and halos. Symptoms are negatively affecting pt's' quality of life. HPI was performed by Fabio Pierce .  Medical History: Dry Eyes Glaucoma Cataracts Ptosis, Corneal scar, MGD, Myopia, Asigmatism, Presbyopia Macula Degeneration Glaucoma Arthritis Diabetes Heart Problem High Blood Pressure IBS, GERD, Hx of Shingles  Review of Systems Negative Allergic/Immunologic Negative Cardiovascular Negative Constitutional Negative Ear, Nose, Mouth & Throat Negative Endocrine Negative Eyes Negative Gastrointestinal Negative Genitourinary Negative Hemotologic/Lymphatic Negative Integumentary Negative Musculoskeletal Negative Neurological Negative Psychiatry Negative Respiratory  Social   Never smoked   Medication Brimonidine, Lumigan, Moxifloxacin, Ilevro, Alprazolam, Cholestryamine, Ferrous gluconate, Lomotil, Metoprolol, Multivitamin, Vitamin D3, Pantoprazole,  Simetichone, Sucralfate, Zofran,   Sx/Procedures Phaco c IOL OS, Ulcer sx, Gallbladder Removal, Hysterectomy,   Drug Allergies  Aspirin, Codeine, Fentanyl, Nsaid drugs, PROPOXYPHENE NAPSYLATE,   History & Physical: Heent: Cataract, Right Eye & Glaucoma, Right Eye NECK: supple without bruits LUNGS: lungs clear to auscultation CV: regular rate and rhythm Abdomen: soft and non-tender  Impression & Plan: Assessment: 1.  COMBINED FORMS AGE RELATED CATARACT; Right Eye (H25.811) 2.  CATARACT EXTRACTION STATUS; Left Eye (Z98.42) 3.  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  Cataract accounts for the patient's decreased vision. This visual impairment is not correctable with a tolerable change in glasses or contact lenses. Cataract surgery with an implantation of a new lens should significantly improve the visual and functional status of the patient. Discussed all risks, benefits, alternatives, and potential complications. Discussed the procedures and recovery. Patient desires to have surgery. A-scan ordered and performed today for intra-ocular lens calculations. The surgery will be performed in order to improve vision for driving, reading, and for eye examinations. Recommend phacoemulsification with intra-ocular lens. Recommend Dextenza for post-operative pain and inflammation. Surgery required to correct imbalance of vision. Right Eye. Dilates poorly - shugarcaine by protocol. Malyugin Ring. Omidira. Recommend iAccess OD to control pressure in setting of glaucoma.  2.   3.  Much improved IOP today OD. Thick corneas. OCT rNFL WNL at this point. Detailed discussion about glaucoma today including importance of maintaining good follow up and following treatment plan, and the possibility of irreversible blindness as part of this disease process. Continue Brimonidine to 3x/day. Continue Xelpros 1 drop both eyes every night at bedtime Given improved IOP, will hold off on  SLT for now and continue drops. Patient has problems remembering to take drops - compliance is an issue. Recommend iAccess right eye at the time of cataract surgery to improve compliance and help lower eye pressure.

## 2021-10-26 ENCOUNTER — Ambulatory Visit (HOSPITAL_COMMUNITY)
Admission: RE | Admit: 2021-10-26 | Discharge: 2021-10-26 | Disposition: A | Discharge status: Home / Self Care | Payer: BC Managed Care – PPO | Attending: Ophthalmology | Admitting: Ophthalmology

## 2021-10-26 ENCOUNTER — Encounter (HOSPITAL_COMMUNITY): Payer: Self-pay | Admitting: Ophthalmology

## 2021-10-26 ENCOUNTER — Ambulatory Visit (HOSPITAL_COMMUNITY): Payer: BC Managed Care – PPO | Admitting: Anesthesiology

## 2021-10-26 ENCOUNTER — Other Ambulatory Visit: Payer: Self-pay

## 2021-10-26 ENCOUNTER — Encounter (HOSPITAL_COMMUNITY)
Admission: RE | Disposition: A | Discharge status: Home / Self Care | Payer: Self-pay | Source: Home / Self Care | Attending: Ophthalmology

## 2021-10-26 DIAGNOSIS — K219 Gastro-esophageal reflux disease without esophagitis: Secondary | ICD-10-CM | POA: Insufficient documentation

## 2021-10-26 DIAGNOSIS — H25811 Combined forms of age-related cataract, right eye: Secondary | ICD-10-CM | POA: Diagnosis not present

## 2021-10-26 DIAGNOSIS — E1136 Type 2 diabetes mellitus with diabetic cataract: Secondary | ICD-10-CM | POA: Diagnosis present

## 2021-10-26 DIAGNOSIS — F419 Anxiety disorder, unspecified: Secondary | ICD-10-CM | POA: Insufficient documentation

## 2021-10-26 DIAGNOSIS — Z8711 Personal history of peptic ulcer disease: Secondary | ICD-10-CM | POA: Insufficient documentation

## 2021-10-26 DIAGNOSIS — H401411 Capsular glaucoma with pseudoexfoliation of lens, right eye, mild stage: Secondary | ICD-10-CM | POA: Insufficient documentation

## 2021-10-26 HISTORY — PX: CATARACT EXTRACTION W/PHACO: SHX586

## 2021-10-26 SURGERY — PHACOEMULSIFICATION, CATARACT, WITH IOL INSERTION
Anesthesia: Monitor Anesthesia Care | Site: Eye | Laterality: Right

## 2021-10-26 MED ORDER — TETRACAINE HCL 0.5 % OP SOLN
1.0000 [drp] | OPHTHALMIC | Status: AC | PRN
  Administered 2021-10-26 (×3): 1 [drp] via OPHTHALMIC

## 2021-10-26 MED ORDER — MIDAZOLAM HCL 5 MG/5ML IJ SOLN
INTRAMUSCULAR | Status: DC | PRN
  Administered 2021-10-26: 1 mg via INTRAVENOUS
  Administered 2021-10-26 (×2): .5 mg via INTRAVENOUS

## 2021-10-26 MED ORDER — MIDAZOLAM HCL 2 MG/2ML IJ SOLN
INTRAMUSCULAR | Status: AC
  Filled 2021-10-26: qty 2

## 2021-10-26 MED ORDER — SODIUM HYALURONATE 23MG/ML IO SOSY
PREFILLED_SYRINGE | INTRAOCULAR | Status: DC | PRN
  Administered 2021-10-26: 0.6 mL via INTRAOCULAR

## 2021-10-26 MED ORDER — STERILE WATER FOR IRRIGATION IR SOLN
Status: DC | PRN
  Administered 2021-10-26: 250 mL

## 2021-10-26 MED ORDER — BSS IO SOLN
INTRAOCULAR | Status: DC | PRN
  Administered 2021-10-26: 15 mL via INTRAOCULAR

## 2021-10-26 MED ORDER — PHENYLEPHRINE-KETOROLAC 1-0.3 % IO SOLN
INTRAOCULAR | Status: DC | PRN
  Administered 2021-10-26: 500 mL via OPHTHALMIC

## 2021-10-26 MED ORDER — TROPICAMIDE 1 % OP SOLN
1.0000 [drp] | OPHTHALMIC | Status: AC | PRN
  Administered 2021-10-26 (×3): 1 [drp] via OPHTHALMIC
  Filled 2021-10-26: qty 2

## 2021-10-26 MED ORDER — PHENYLEPHRINE-KETOROLAC 1-0.3 % IO SOLN
INTRAOCULAR | Status: AC
  Filled 2021-10-26: qty 4

## 2021-10-26 MED ORDER — LIDOCAINE HCL (PF) 1 % IJ SOLN
INTRAOCULAR | Status: DC | PRN
  Administered 2021-10-26: 1 mL via OPHTHALMIC

## 2021-10-26 MED ORDER — PHENYLEPHRINE HCL 2.5 % OP SOLN
1.0000 [drp] | OPHTHALMIC | Status: AC | PRN
  Administered 2021-10-26 (×3): 1 [drp] via OPHTHALMIC

## 2021-10-26 MED ORDER — SODIUM HYALURONATE 10 MG/ML IO SOLUTION
PREFILLED_SYRINGE | INTRAOCULAR | Status: DC | PRN
  Administered 2021-10-26: 0.85 mL via INTRAOCULAR

## 2021-10-26 MED ORDER — LIDOCAINE HCL 3.5 % OP GEL
1.0000 "application " | Freq: Once | OPHTHALMIC | Status: AC
  Administered 2021-10-26: 1 via OPHTHALMIC

## 2021-10-26 MED ORDER — NEOMYCIN-POLYMYXIN-DEXAMETH 3.5-10000-0.1 OP SUSP
OPHTHALMIC | Status: DC | PRN
  Administered 2021-10-26: 1 [drp] via OPHTHALMIC

## 2021-10-26 MED ORDER — POVIDONE-IODINE 5 % OP SOLN
OPHTHALMIC | Status: DC | PRN
  Administered 2021-10-26: 1 via OPHTHALMIC

## 2021-10-26 MED ORDER — SODIUM CHLORIDE 0.9% FLUSH
INTRAVENOUS | Status: DC | PRN
  Administered 2021-10-26 (×3): 5 mL via INTRAVENOUS

## 2021-10-26 SURGICAL SUPPLY — 18 items
CATARACT SUITE SIGHTPATH (MISCELLANEOUS) ×2 IMPLANT
CLIP IPRISM (KITS) ×1 IMPLANT
CLOTH BEACON ORANGE TIMEOUT ST (SAFETY) ×2 IMPLANT
EYE SHIELD UNIVERSAL CLEAR (GAUZE/BANDAGES/DRESSINGS) ×1 IMPLANT
FEE CATARACT SUITE SIGHTPATH (MISCELLANEOUS) ×1 IMPLANT
GLOVE SURG UNDER POLY LF SZ6.5 (GLOVE) IMPLANT
GLOVE SURG UNDER POLY LF SZ7 (GLOVE) ×3 IMPLANT
LENS IOL RAYNER 18.5 (Intraocular Lens) ×2 IMPLANT
LENS IOL RAYONE EMV 18.5 (Intraocular Lens) IMPLANT
NDL HYPO 18GX1.5 BLUNT FILL (NEEDLE) ×1 IMPLANT
NEEDLE HYPO 18GX1.5 BLUNT FILL (NEEDLE) ×2 IMPLANT
PAD ARMBOARD 7.5X6 YLW CONV (MISCELLANEOUS) ×2 IMPLANT
RING MALYGIN 7.0 (MISCELLANEOUS) IMPLANT
SYR TB 1ML LL NO SAFETY (SYRINGE) ×2 IMPLANT
TAPE SURG TRANSPORE 1 IN (GAUZE/BANDAGES/DRESSINGS) IMPLANT
TAPE SURGICAL TRANSPORE 1 IN (GAUZE/BANDAGES/DRESSINGS) ×2
TREPHINE GLAUKO IACCESS TRBCLR (OPHTHALMIC) ×1 IMPLANT
WATER STERILE IRR 250ML POUR (IV SOLUTION) ×2 IMPLANT

## 2021-10-26 NOTE — Anesthesia Postprocedure Evaluation (Signed)
Anesthesia Post Note  Patient: [name]  Procedure(s) Performed: CATARACT EXTRACTION PHACO AND INTRAOCULAR LENS PLACEMENT (IOC) (Right: Eye) Goniotomy (Right: Eye)  Patient location during evaluation: Phase II Anesthesia Type: MAC Level of consciousness: awake Pain management: pain level controlled Vital Signs Assessment: post-procedure vital signs reviewed and stable Respiratory status: spontaneous breathing and respiratory function stable Cardiovascular status: blood pressure returned to baseline and stable Postop Assessment: no headache and no apparent nausea or vomiting Anesthetic complications: no Comments: Late entry   No notable events documented.   Last Vitals:  Vitals:   10/26/21 0804 10/26/21 0859  BP: 110/82 94/68  Pulse: 68 (!) 55  Resp: 14 16  Temp: 36.7 C 36.6 C  SpO2: 100% 99%    Last Pain:  Vitals:   10/26/21 0859  TempSrc: Oral  PainSc: 0-No pain                 [name]

## 2021-10-26 NOTE — Transfer of Care (Signed)
Immediate Anesthesia Transfer of Care Note  Patient: Lindsay Lawrence  Procedure(s) Performed: CATARACT EXTRACTION PHACO AND INTRAOCULAR LENS PLACEMENT (IOC) (Right: Eye) Goniotomy (Right: Eye)  Patient Location: PACU  Anesthesia Type:MAC  Level of Consciousness: awake, alert  and oriented  Airway & Oxygen Therapy: Patient Spontanous Breathing  Post-op Assessment: Report given to RN, Post -op Vital signs reviewed and stable, Patient moving all extremities X 4 and Patient able to stick tongue midline  Post vital signs: Reviewed  Last Vitals:  Vitals Value Taken Time  BP 94/68   Temp 98.0   Pulse 66   Resp 15   SpO2 100     Last Pain:  Vitals:   10/26/21 0804  TempSrc: Oral  PainSc: 0-No pain         Complications: No notable events documented.

## 2021-10-26 NOTE — Op Note (Signed)
Date of procedure: 10/26/21  Pre-operative diagnosis:  1) Visually significant age-related combined cataract, Right Eye (H25.811) 2) Pseudoexfolation Glaucoma, mild Stage, Right Eye  Post-operative diagnosis: 1) Visually significant age-related cataract, Right Eye 2) Pseudoexfolation Glaucoma, mild Stage, Right Eye  Procedure:  1) Removal of cataract via phacoemulsification and insertion of intra-ocular lens Rayner [id number] +18.5D into the capsular bag of the Right Eye 2) Goniotomy treatment of trabecular meshwork with iAccess, right eye  Attending surgeon: Gerda Diss. Liliane Mallis, MD, MA  Anesthesia: MAC, Topical Akten  Complications: None  Estimated Blood Loss: <38m (minimal)  Specimens: None  Implants: As above  Indications:  Visually significant age-related cataract, Right Eye; Glaucoma, Right Eye  Procedure:  The patient was seen and identified in the pre-operative area. The operative eye was identified and dilated.  The operative eye was marked.  Topical anesthesia was administered to the operative eye.     The patient was then to the operative suite and placed in the supine position.  A timeout was performed confirming the patient, procedure to be performed, and all other relevant information.   The patient's face was prepped and draped in the usual fashion for intra-ocular surgery.  A lid speculum was placed into the operative eye and the surgical microscope moved into place and focused.  A superotemporal paracentesis was created using a 20 gauge paracentesis blade.  Shugarcaine was injected into the anterior chamber.  Viscoelastic was injected into the anterior chamber.  A temporal clear-corneal main wound incision was created using a 2.440mmicrokeratome.  A continuous curvilinear capsulorrhexis was initiated using an irrigating cystitome and completed using capsulorrhexis forceps.  Hydrodissection and hydrodeliniation were performed.  Viscoelastic was injected into the anterior  chamber.  A phacoemulsification handpiece and a chopper as a second instrument were used to remove the nucleus and epinucleus. The irrigation/aspiration handpiece was used to remove any remaining cortical material.   The capsular bag was reinflated with viscoelastic, checked, and found to be intact.  The intraocular lens was inserted into the capsular bag and dialed into place using a Kuglen hook.    The patient's head was repositioned.  The nasal trabecular meshwork was treated extensively for 90 degrees using the iAccess device.  The patient's head was returned to neutral.  The irrigation/aspiration handpiece was used to remove any remaining viscoelastic.  The clear corneal wound and paracentesis wounds were then hydrated and checked with Weck-Cels to be watertight.  The lid-speculum was removed.  The drape was removed.  The patient's face was cleaned with a wet and dry 4x4.  Maxitrol was instilled in the eye before a clear shield was taped over the eye. The patient was taken to the post-operative care unit in good condition, having tolerated the procedure well.  Post-Op Instructions: [facility] and will receive all other orders and instructions.

## 2021-10-26 NOTE — Interval H&P Note (Signed)
History and Physical Interval Note:  10/26/2021 8:23 AM  Lindsay Lawrence  has presented today for surgery, with the diagnosis of combined forms age related cataract; right eye glaucoma; right eye.  The various methods of treatment have been discussed with the patient and family. After consideration of risks, benefits and other options for treatment, the patient has consented to  Procedure(s) with comments: CATARACT EXTRACTION PHACO AND INTRAOCULAR LENS PLACEMENT (IOC) (Right) - right Goniotomy (Right) as a surgical intervention.  The patient's history has been reviewed, patient examined, no change in status, stable for surgery.  I have reviewed the patient's chart and labs.  Questions were answered to the patient's satisfaction.     Fabio Pierce

## 2021-10-26 NOTE — Discharge Instructions (Signed)
Please discharge patient when stable, will follow up today with Dr. Libra Gatz at the [facility] [place] office immediately following discharge.  Leave shield in place until visit.  All paperwork with discharge instructions will be given at the office.  [facility] Address:  [address]  [place], [place] [address]  

## 2021-10-26 NOTE — Anesthesia Preprocedure Evaluation (Signed)
Anesthesia Evaluation  °Patient identified by MRN, date of birth, ID band °Patient awake ° ° ° °Reviewed: °Allergy & Precautions, H&P , NPO status , Patient's Chart, lab work & pertinent test results, reviewed documented beta blocker date and time  ° °Airway °Mallampati: II ° °TM Distance: >3 FB °Neck ROM: full ° ° ° Dental °no notable dental hx. ° °  °Pulmonary °shortness of breath,  °  °Pulmonary exam normal °breath sounds clear to auscultation ° ° ° ° ° ° Cardiovascular °Exercise Tolerance: Good °negative cardio ROS ° ° °Rhythm:regular Rate:Normal ° ° °  °Neuro/Psych °PSYCHIATRIC DISORDERS Anxiety negative neurological ROS °   ° GI/Hepatic °Neg liver ROS, PUD, GERD  Medicated,  °Endo/Other  °negative endocrine ROS ° Renal/GU °negative Renal ROS  °negative genitourinary °  °Musculoskeletal ° ° Abdominal °  °Peds ° Hematology ° °(+) Blood dyscrasia, anemia ,   °Anesthesia Other Findings ° ° Reproductive/Obstetrics °negative OB ROS ° °  ° ° ° ° ° ° ° ° ° ° ° ° ° °  °  ° ° ° ° ° ° ° ° °Anesthesia Physical °Anesthesia Plan ° °ASA: 2 ° °Anesthesia Plan: MAC  ° °Post-op Pain Management:   ° °Induction:  ° °PONV Risk Score and Plan:  ° °Airway Management Planned:  ° °Additional Equipment:  ° °Intra-op Plan:  ° °Post-operative Plan:  ° °Informed Consent: I have reviewed the patients History and Physical, chart, labs and discussed the procedure including the risks, benefits and alternatives for the proposed anesthesia with the patient or authorized representative who has indicated his/her understanding and acceptance.  ° ° ° °Dental Advisory Given ° °Plan Discussed with: CRNA ° °Anesthesia Plan Comments:   ° ° ° ° ° °Anesthesia Quick Evaluation ° °

## 2021-10-27 ENCOUNTER — Encounter (HOSPITAL_COMMUNITY): Payer: Self-pay | Admitting: Ophthalmology

## 2022-01-12 ENCOUNTER — Ambulatory Visit (INDEPENDENT_AMBULATORY_CARE_PROVIDER_SITE_OTHER): Payer: BC Managed Care – PPO | Admitting: Internal Medicine

## 2022-01-12 ENCOUNTER — Encounter (INDEPENDENT_AMBULATORY_CARE_PROVIDER_SITE_OTHER): Payer: Self-pay | Admitting: Internal Medicine

## 2022-01-12 VITALS — BP 123/81 | HR 75 | Temp 98.4°F | Ht 64.0 in | Wt 110.8 lb

## 2022-01-12 DIAGNOSIS — R110 Nausea: Secondary | ICD-10-CM

## 2022-01-12 DIAGNOSIS — K529 Noninfective gastroenteritis and colitis, unspecified: Secondary | ICD-10-CM | POA: Diagnosis not present

## 2022-01-12 DIAGNOSIS — F411 Generalized anxiety disorder: Secondary | ICD-10-CM

## 2022-01-12 DIAGNOSIS — R1319 Other dysphagia: Secondary | ICD-10-CM

## 2022-01-12 DIAGNOSIS — E559 Vitamin D deficiency, unspecified: Secondary | ICD-10-CM

## 2022-01-12 DIAGNOSIS — F419 Anxiety disorder, unspecified: Secondary | ICD-10-CM

## 2022-01-12 DIAGNOSIS — Z8639 Personal history of other endocrine, nutritional and metabolic disease: Secondary | ICD-10-CM

## 2022-01-12 DIAGNOSIS — K3184 Gastroparesis: Secondary | ICD-10-CM | POA: Insufficient documentation

## 2022-01-12 DIAGNOSIS — K219 Gastro-esophageal reflux disease without esophagitis: Secondary | ICD-10-CM

## 2022-01-12 MED ORDER — METOCLOPRAMIDE HCL 5 MG PO TABS: 5.0000 mg | ORAL_TABLET | Freq: Three times a day (TID) | ORAL | 0 refills | Status: DC

## 2022-01-12 MED ORDER — METOCLOPRAMIDE HCL 5 MG PO TABS: 5.0000 mg | ORAL_TABLET | Freq: Three times a day (TID) | ORAL | 1 refills | Status: DC

## 2022-01-12 MED ORDER — DIPHENOXYLATE-ATROPINE 2.5-0.025 MG PO TABS: 1.0000 | ORAL_TABLET | Freq: Four times a day (QID) | ORAL | 2 refills | Status: DC | PRN

## 2022-01-12 MED ORDER — ONDANSETRON HCL 4 MG PO TABS: 4.0000 mg | ORAL_TABLET | Freq: Two times a day (BID) | ORAL | 0 refills | Status: DC | PRN

## 2022-01-12 MED ORDER — ALPRAZOLAM 0.25 MG PO TABS: 0.2500 mg | ORAL_TABLET | Freq: Three times a day (TID) | ORAL | 0 refills | Status: AC | PRN

## 2022-01-12 NOTE — Progress Notes (Signed)
error 

## 2022-01-12 NOTE — Patient Instructions (Signed)
Physician will call with results of blood test when completed ?Remember to eat lightly the day before the procedure and take metoclopramide 5 mg 3 times a day before the procedure ?

## 2022-01-12 NOTE — Progress Notes (Signed)
Presenting complaint; ? ?Follow-up for multiple problems. ?Patient complains of worsening epigastric pain and nausea. ? ?Database and subjective: ? ?Patient is 66 year old Caucasian female with chronic GERD history of dysphagia iron deficiency anemia vitamin D2 deficiency which has never corrected with replacement therapy even at the higher dose, history of peptic ulcer disease status post vagotomy and antrectomy, history of gastroparesis secondary to vagotomy as well as chronic diarrhea who is here for scheduled visit.  She was last seen on 07/07/2021. ? ?She weighed 109 pounds on that visit and she weighs 110 pounds. ?She states she is not doing well.  She is having worsening pain in epigastric region with nausea.  Pain is worse at night and she also complains of burning in her throat.  She has not had any vomiting.  On most days her appetite is good.  She has not lost any weight.  She is trying to control her slow stomach by watching her diet.  She has not taken metoclopramide in several months.  She remains with diarrhea.  She has anywhere from 2-5 stools per day.  She denies melena or rectal bleeding.  She has OCCASIONAL nocturnal bowel movement.  She says heartburn is well controlled with PPI.  Swallowing difficulty has gotten worse.  She has difficulty with meat and bread and she has at least 2-3 episodes per week.  Her last EGD was in May 2022.  Esophagus was not dilated because she had food debris in her stomach. ?She does not take any NSAIDs.  She eats supper at 7 PM and goes to bed after 10 PM. ? ?Current Medications: ?Outpatient Encounter Medications as of 01/12/2022  ?Medication Sig  ? acetaminophen (TYLENOL) 500 MG tablet Take 500 mg by mouth every 6 (six) hours as needed for headache.  ? ALPRAZolam (XANAX) 0.25 MG tablet Take 1 tablet by mouth three times daily as needed  ? Azelastine HCl 0.15 % SOLN Place 1 spray into the nose daily as needed (sinus congestion).   ? Cholecalciferol (VITAMIN D3) 5000  UNITS CAPS Take 5,000 Units by mouth daily.   ? diphenoxylate-atropine (LOMOTIL) 2.5-0.025 MG tablet Take 1 tablet by mouth 4 (four) times daily as needed for diarrhea or loose stools.  ? ferrous gluconate (FERGON) 324 MG tablet Take 324 mg by mouth daily at 6 (six) AM.  ? metoCLOPramide (REGLAN) 5 MG tablet Take 1 tablet (5 mg total) by mouth 3 (three) times daily before meals.  ? metoprolol tartrate (LOPRESSOR) 25 MG tablet Take 1 tablet by mouth twice daily  ? Multiple Vitamin (MULTIVITAMIN WITH MINERALS) TABS tablet Take 1 tablet by mouth daily.  ? ondansetron (ZOFRAN) 4 MG tablet TAKE 1 TABLET BY MOUTH EVERY 12 HOURS AS NEEDED FOR NAUSEA  ? pantoprazole (PROTONIX) 40 MG tablet Take 1 tablet (40 mg total) by mouth daily before breakfast.  ? promethazine (PHENADOZ) 25 MG suppository Place 1 suppository (25 mg total) rectally 2 (two) times daily as needed for nausea or vomiting.  ? promethazine (PHENERGAN) 25 MG tablet Take 1 tablet (25 mg total) by mouth 2 (two) times daily as needed for nausea or vomiting.  ? rosuvastatin (CRESTOR) 5 MG tablet Take 5 mg by mouth once a week.  ? Simethicone (PHAZYME) 180 MG CAPS Take 1 capsule (180 mg total) by mouth 2 (two) times daily as needed. (Patient taking differently: Take 180 mg by mouth 2 (two) times daily as needed (bloating).)  ? sucralfate (CARAFATE) 1 g tablet Take 2 tablets (2 g total)  by mouth at bedtime as needed (acid reflux).  ? vitamin B-12 (CYANOCOBALAMIN) 1000 MCG tablet Take 1,000 mcg by mouth daily.  ? zinc gluconate 50 MG tablet Take 50 mg by mouth daily.  ? [DISCONTINUED] brimonidine (ALPHAGAN) 0.2 % ophthalmic solution Place 1 drop into the right eye 2 (two) times daily.  ? [DISCONTINUED] latanoprost (XALATAN) 0.005 % ophthalmic solution Place 1 drop into both eyes at bedtime.  ? ?No facility-administered encounter medications on file as of 01/12/2022.  ? ? ? ?Objective: ?Blood pressure 123/81, pulse 75, temperature 98.4 ?F (36.9 ?C), temperature source  Oral, height 5' 4"  (1.626 m), weight 110 lb 12.8 oz (50.3 kg). ?Patient is alert and in no acute distress. ?Conjunctiva is pink. Sclera is nonicteric ?Oropharyngeal mucosa is normal. ?No neck masses or thyromegaly noted. ?Cardiac exam with regular rhythm normal S1 and S2. No murmur or gallop noted. ?Lungs are clear to auscultation. ?Abdomen is flat.  She has upper midline scar.  Bowel sounds are normal.  On palpation she has mild generalized tenderness but is more pronounced in midepigastric region.  No organomegaly or masses. ?No LE edema or clubbing noted. ? ?Labs/studies Results: ? ? ? ?  Latest Ref Rng & Units 12/31/2019  ? 10:51 AM 01/10/2018  ? 12:53 PM 07/12/2017  ? 11:16 AM  ?CBC  ?WBC 3.8 - 10.8 Thousand/uL 4.6   5.1   6.0    ?Hemoglobin 11.7 - 15.5 g/dL 12.0   12.4   12.7    ?Hematocrit 35.0 - 45.0 % 36.7   36.9   37.7    ?Platelets 140 - 400 Thousand/uL 208   199   181    ?  ? ?  Latest Ref Rng & Units 06/04/2021  ? 10:19 AM 12/31/2019  ? 10:51 AM 02/28/2017  ? 12:52 PM  ?CMP  ?Glucose 65 - 139 mg/dL  99   106    ?BUN 7 - 25 mg/dL  8   11    ?Creatinine 0.50 - 0.99 mg/dL  0.76   0.77    ?Sodium 135 - 146 mmol/L  140   139    ?Potassium 3.5 - 5.3 mmol/L  4.2   4.0    ?Chloride 98 - 110 mmol/L  103   101    ?CO2 20 - 32 mmol/L  29   30    ?Calcium 8.6 - 10.4 mg/dL  9.1   9.4    ?Total Protein 6.1 - 8.1 g/dL 6.5   6.2     ?Total Bilirubin 0.2 - 1.2 mg/dL 0.4   0.4     ?AST 10 - 35 U/L 17   16     ?ALT 6 - 29 U/L 13   13     ?  ? ?  Latest Ref Rng & Units 06/04/2021  ? 10:19 AM 12/31/2019  ? 10:51 AM 01/04/2017  ? 10:44 AM  ?Hepatic Function  ?Total Protein 6.1 - 8.1 g/dL 6.5   6.2   5.9    ?Albumin 3.6 - 5.1 g/dL   3.9    ?AST 10 - 35 U/L 17   16   17     ?ALT 6 - 29 U/L 13   13   12     ?Alk Phosphatase 33 - 130 U/L   55    ?Total Bilirubin 0.2 - 1.2 mg/dL 0.4   0.4   0.4    ?Bilirubin, Direct 0.0 - 0.2 mg/dL 0.1      ?  ? ? ?  Assessment: ? ?#1.  Chronic GERD.  Heartburn is well controlled with therapy. ? ?#2.   Esophageal dysphagia.  She has a history of Schatzki's ring which has been disrupted in the past.  EGD 1 year ago did not reveal structural abnormality.  Esophagus was not dilated with stomach was full of food.  Since dysphagia is worse and occurring every week esophageal dilation would be appropriate prior to considering esophageal manometry. ? ?#3.  History of iron deficiency anemia.  She has undergone multiple endoscopic evaluation in the past.  Lately her H&H has been normal but her ferritin levels have been low or low normal.  She is due for repeat lab. ? ?#4.  Epigastric pain with nausea.  Need to rule out anastomotic ulcer or anastomotic stricture.  Epigastric pain may simply be due to gastroparesis. ? ?#5.  History of vitamin D2 deficiency.  Studies in the past have been negative for malabsorptive syndrome.  At 1 point she was treated with 200,000 units of vitamin D2 every week and vitamin D to level remained low.  Endocrinologist at [facility] recommended parenteral vitamin D but cost was prohibitive. ? ?#6.  Chronic diarrhea.  She has a history of IBS.  Gastric surgery may also be contributing to this symptom.  She is on low-dose diphenoxylate. ? ?#7.  Anxiety. ? ? ?Plan: ? ?New prescription for alprazolam given. ?New prescription for diphenoxylate given. ?New prescription for metoclopramide given. ?CBC with differential, serum iron TIBC ferritin and metabolic 7. ?Vitamin D2 level. ?Esophagogastroduodenoscopy with esophageal dilation under monitored anesthesia care.  Patient advised to eat low residue food the day before and take 3 doses of metoclopramide. ?Office visit in 6 months. ? ? ? ? ? ? ? ?

## 2022-01-13 ENCOUNTER — Encounter (INDEPENDENT_AMBULATORY_CARE_PROVIDER_SITE_OTHER): Payer: Self-pay

## 2022-01-13 ENCOUNTER — Other Ambulatory Visit (INDEPENDENT_AMBULATORY_CARE_PROVIDER_SITE_OTHER): Payer: Self-pay

## 2022-01-13 DIAGNOSIS — R1319 Other dysphagia: Secondary | ICD-10-CM

## 2022-01-13 DIAGNOSIS — G8929 Other chronic pain: Secondary | ICD-10-CM

## 2022-01-13 LAB — BASIC METABOLIC PANEL
BUN: 9 mg/dL (ref 7–25)
CO2: 29 mmol/L (ref 20–32)
Calcium: 9.2 mg/dL (ref 8.6–10.4)
Chloride: 104 mmol/L (ref 98–110)
Creat: 0.86 mg/dL (ref 0.50–1.05)
Glucose, Bld: 95 mg/dL (ref 65–139)
Potassium: 4.6 mmol/L (ref 3.5–5.3)
Sodium: 141 mmol/L (ref 135–146)

## 2022-01-13 LAB — CBC WITH DIFFERENTIAL/PLATELET
Absolute Monocytes: 422 cells/uL (ref 200–950)
Basophils Absolute: 73 cells/uL (ref 0–200)
Basophils Relative: 1.1 %
Eosinophils Absolute: 112 cells/uL (ref 15–500)
Eosinophils Relative: 1.7 %
HCT: 37.9 % (ref 35.0–45.0)
Hemoglobin: 12.3 g/dL (ref 11.7–15.5)
Lymphs Abs: 1261 cells/uL (ref 850–3900)
MCH: 28.7 pg (ref 27.0–33.0)
MCHC: 32.5 g/dL (ref 32.0–36.0)
MCV: 88.6 fL (ref 80.0–100.0)
MPV: 11.2 fL (ref 7.5–12.5)
Monocytes Relative: 6.4 %
Neutro Abs: 4732 cells/uL (ref 1500–7800)
Neutrophils Relative %: 71.7 %
Platelets: 220 10*3/uL (ref 140–400)
RBC: 4.28 10*6/uL (ref 3.80–5.10)
RDW: 12.8 % (ref 11.0–15.0)
Total Lymphocyte: 19.1 %
WBC: 6.6 10*3/uL (ref 3.8–10.8)

## 2022-01-13 LAB — IRON,TIBC AND FERRITIN PANEL
%SAT: 31 % (calc) (ref 16–45)
Ferritin: 11 ng/mL — ABNORMAL LOW (ref 16–288)
Iron: 130 ug/dL (ref 45–160)
TIBC: 417 mcg/dL (calc) (ref 250–450)

## 2022-01-13 LAB — VITAMIN D 25 HYDROXY (VIT D DEFICIENCY, FRACTURES): Vit D, 25-Hydroxy: 28 ng/mL — ABNORMAL LOW (ref 30–100)

## 2022-01-14 ENCOUNTER — Other Ambulatory Visit (HOSPITAL_COMMUNITY): Payer: Self-pay

## 2022-01-19 ENCOUNTER — Telehealth (INDEPENDENT_AMBULATORY_CARE_PROVIDER_SITE_OTHER): Payer: Self-pay | Admitting: *Deleted

## 2022-01-19 NOTE — Telephone Encounter (Signed)
Pt requesting a copy of her last labs mailed to her  ? ?[phone] ?

## 2022-01-19 NOTE — Telephone Encounter (Signed)
Labs mailed to patient.

## 2022-01-21 ENCOUNTER — Encounter (HOSPITAL_COMMUNITY): Payer: Self-pay | Admitting: Internal Medicine

## 2022-01-21 ENCOUNTER — Other Ambulatory Visit: Payer: Self-pay

## 2022-01-21 ENCOUNTER — Ambulatory Visit (HOSPITAL_COMMUNITY): Payer: BC Managed Care – PPO | Admitting: Anesthesiology

## 2022-01-21 ENCOUNTER — Ambulatory Visit (HOSPITAL_COMMUNITY)
Admission: RE | Admit: 2022-01-21 | Discharge: 2022-01-21 | Disposition: A | Discharge status: Home / Self Care | Payer: BC Managed Care – PPO | Attending: Internal Medicine | Admitting: Internal Medicine

## 2022-01-21 ENCOUNTER — Encounter (HOSPITAL_COMMUNITY)
Admission: RE | Disposition: A | Discharge status: Home / Self Care | Payer: Self-pay | Source: Home / Self Care | Attending: Internal Medicine

## 2022-01-21 DIAGNOSIS — K222 Esophageal obstruction: Secondary | ICD-10-CM | POA: Insufficient documentation

## 2022-01-21 DIAGNOSIS — Z9049 Acquired absence of other specified parts of digestive tract: Secondary | ICD-10-CM | POA: Insufficient documentation

## 2022-01-21 DIAGNOSIS — Z980 Intestinal bypass and anastomosis status: Secondary | ICD-10-CM | POA: Insufficient documentation

## 2022-01-21 DIAGNOSIS — G8929 Other chronic pain: Secondary | ICD-10-CM

## 2022-01-21 DIAGNOSIS — Z8711 Personal history of peptic ulcer disease: Secondary | ICD-10-CM | POA: Insufficient documentation

## 2022-01-21 DIAGNOSIS — R1314 Dysphagia, pharyngoesophageal phase: Secondary | ICD-10-CM

## 2022-01-21 DIAGNOSIS — K219 Gastro-esophageal reflux disease without esophagitis: Secondary | ICD-10-CM | POA: Diagnosis not present

## 2022-01-21 DIAGNOSIS — K319 Disease of stomach and duodenum, unspecified: Secondary | ICD-10-CM | POA: Insufficient documentation

## 2022-01-21 DIAGNOSIS — R1013 Epigastric pain: Secondary | ICD-10-CM | POA: Diagnosis not present

## 2022-01-21 DIAGNOSIS — K2289 Other specified disease of esophagus: Secondary | ICD-10-CM | POA: Diagnosis not present

## 2022-01-21 DIAGNOSIS — R1319 Other dysphagia: Secondary | ICD-10-CM

## 2022-01-21 HISTORY — PX: ESOPHAGOGASTRODUODENOSCOPY (EGD) WITH PROPOFOL: SHX5813

## 2022-01-21 HISTORY — PX: ESOPHAGEAL DILATION: SHX303

## 2022-01-21 HISTORY — PX: BIOPSY: SHX5522

## 2022-01-21 SURGERY — ESOPHAGOGASTRODUODENOSCOPY (EGD) WITH PROPOFOL
Anesthesia: General

## 2022-01-21 MED ORDER — PROPOFOL 10 MG/ML IV BOLUS
INTRAVENOUS | Status: DC | PRN
  Administered 2022-01-21: 80 mg via INTRAVENOUS
  Administered 2022-01-21: 70 mg via INTRAVENOUS

## 2022-01-21 MED ORDER — LIDOCAINE HCL (CARDIAC) PF 50 MG/5ML IV SOSY
PREFILLED_SYRINGE | INTRAVENOUS | Status: DC | PRN
  Administered 2022-01-21: 80 mg via INTRAVENOUS

## 2022-01-21 MED ORDER — LACTATED RINGERS IV SOLN: INTRAVENOUS | Status: DC

## 2022-01-21 MED ORDER — SUCRALFATE 1 G PO TABS: 1.0000 g | ORAL_TABLET | Freq: Three times a day (TID) | ORAL | 2 refills | Status: DC

## 2022-01-21 NOTE — Anesthesia Preprocedure Evaluation (Signed)
Anesthesia Evaluation  ?Patient identified by MRN, date of birth, ID band ?Patient awake ? ? ? ?Reviewed: ?Allergy & Precautions, H&P , NPO status , Patient's Chart, lab work & pertinent test results, reviewed documented beta blocker date and time  ? ?Airway ?Mallampati: II ? ?TM Distance: >3 FB ?Neck ROM: full ? ? ? Dental ?no notable dental hx. ? ?  ?Pulmonary ?shortness of breath,  ?  ?Pulmonary exam normal ?breath sounds clear to auscultation ? ? ? ? ? ? Cardiovascular ?Exercise Tolerance: Good ?negative cardio ROS ? ? ?Rhythm:regular Rate:Normal ? ? ?  ?Neuro/Psych ?PSYCHIATRIC DISORDERS Anxiety negative neurological ROS ?   ? GI/Hepatic ?Neg liver ROS, PUD, GERD  Medicated,  ?Endo/Other  ?negative endocrine ROS ? Renal/GU ?negative Renal ROS  ?negative genitourinary ?  ?Musculoskeletal ? ? Abdominal ?  ?Peds ? Hematology ? ?(+) Blood dyscrasia, anemia ,   ?Anesthesia Other Findings ? ? Reproductive/Obstetrics ?negative OB ROS ? ?  ? ? ? ? ? ? ? ? ? ? ? ? ? ?  ?  ? ? ? ? ? ? ? ? ?Anesthesia Physical ? ?Anesthesia Plan ? ?ASA: 2 ? ?Anesthesia Plan: General  ? ?Post-op Pain Management:   ? ?Induction:  ? ?PONV Risk Score and Plan: Propofol infusion ? ?Airway Management Planned:  ? ?Additional Equipment:  ? ?Intra-op Plan:  ? ?Post-operative Plan:  ? ?Informed Consent: I have reviewed the patients History and Physical, chart, labs and discussed the procedure including the risks, benefits and alternatives for the proposed anesthesia with the patient or authorized representative who has indicated his/her understanding and acceptance.  ? ? ? ?Dental Advisory Given ? ?Plan Discussed with: CRNA ? ?Anesthesia Plan Comments:   ? ? ? ? ? ? ?Anesthesia Quick Evaluation ? ?

## 2022-01-21 NOTE — Op Note (Signed)
[facility] ?Patient Name: Lindsay Lawrence ?Procedure Date: 01/21/2022 8:14 AM ?MRN: [id number] ?Date of Birth: [birth year] ?Attending MD: Lionel December , MD ?CSN: [id number] ?Age: 66 ?Admit Type: Outpatient ?Procedure:                Upper GI endoscopy ?Indications:              Epigastric abdominal pain, Esophageal dysphagia ?Providers:                Lionel December, MD, Buel Ream Thomasena Edis RN, Charity fundraiser,  ?                          Kristine L. Jessee Avers, Technician ?Referring MD:             Ignatius Specking, MD ?Medicines:                Propofol per Anesthesia ?Complications:            No immediate complications. ?Estimated Blood Loss:     Estimated blood loss was minimal. ?Procedure:                Pre-Anesthesia Assessment: ?                          - Prior to the procedure, a History and Physical  ?                          was performed, and patient medications and  ?                          allergies were reviewed. The patient's tolerance of  ?                          previous anesthesia was also reviewed. The risks  ?                          and benefits of the procedure and the sedation  ?                          options and risks were discussed with the patient.  ?                          All questions were answered, and informed consent  ?                          was obtained. Prior Anticoagulants: The patient has  ?                          taken no previous anticoagulant or antiplatelet  ?                          agents. ASA Grade Assessment: II - A patient with  ?                          mild systemic disease. After reviewing the risks  ?  and benefits, the patient was deemed in  ?                          satisfactory condition to undergo the procedure. ?                          After obtaining informed consent, the endoscope was  ?                          passed under direct vision. Throughout the  ?                          procedure, the patient's blood pressure, pulse,  and  ?                          oxygen saturations were monitored continuously. The  ?                          GIF-H190 ([id number]) scope was introduced through the  ?                          mouth, and advanced to the fourth part of duodenum.  ?                          The patient tolerated the procedure well. ?Scope In: 8:36:34 AM ?Scope Out: 8:46:19 AM ?Total Procedure Duration: 0 hours 9 minutes 45 seconds  ?Findings: ?     The hypopharynx was normal. ?     The examined esophagus was normal. ?     The Z-line was irregular and was found 40 cm from the incisors. ?     No endoscopic abnormality was evident in the esophagus to explain the  ?     patient's complaint of dysphagia. It was decided, however, to proceed  ?     with dilation of the entire esophagus. The scope was withdrawn. Dilation  ?     was performed with a Maloney dilator with mild resistance at 54 Fr. The  ?     dilation site was examined following endoscope reinsertion and showed no  ?     change and no bleeding, mucosal tear or perforation. ?     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 A gastric  ?     pouch with a normal size was found containing large amount of bile.. The  ?     gastroduodenal anastomosis was characterized by edema, erythema and  ?     friable mucosa. This was traversed. Biopsies were taken with a cold  ?     forceps for histology. ?     The second portion of the duodenum, third portion of the duodenum and  ?     fourth portion of the duodenum were normal. ?Impression:               - Normal hypopharynx. ?                          - Normal esophagus. ?                          -  Z-line irregular, 40 cm from the incisors. ?                          - No endoscopic esophageal abnormality to explain  ?                          patient's dysphagia. Esophagus dilated. Dilated. ?                          - Patent Billroth I gastroduodenostomy was found,  ?                          characterized by edema, erythema  and friable  ?                          mucosa. Biopsied. ?                          - Normal second portion of the duodenum, third  ?                          portion of the duodenum and fourth portion of the  ?                          duodenum. ?                          Comment: ? Bile induced gastritis ?Moderate Sedation: ?     Per Anesthesia Care ?Recommendation:           - Patient has a contact number available for  ?                          emergencies. The signs and symptoms of potential  ?                          delayed complications were discussed with the  ?                          patient. Return to normal activities tomorrow.  ?                          Written discharge instructions were provided to the  ?                          patient. ?                          - Resume previous diet today. ?                          - Continue present medications. ?                          - Use sucralfate tablets 1 gram PO QID. ?                          -  Await pathology results. ?Procedure Code(s):        --- Professional --- ?                          [id number], Esophagogastroduodenoscopy, flexible,  ?                          transoral; with biopsy, single or multiple ?                          43450, Dilation of esophagus, by unguided sound or  ?                          bougie, single or multiple passes ?Diagnosis Code(s):        --- Professional --- ?                          K22.8, Other specified diseases of esophagus ?                          Z98.0, Intestinal bypass and anastomosis status ?                          R10.13, Epigastric pain ?                          R13.14, Dysphagia, pharyngoesophageal phase ?CPT copyright 2019 [employer]. All rights reserved. ?The codes documented in this report are preliminary and upon coder review may  ?be revised to meet current compliance requirements. ?Lionel DecemberNajeeb Maximillian Habibi, MD ?Lionel DecemberNajeeb Raheen Capili, MD ?01/21/2022 9:00:01 AM ?This report has been signed  electronically. ?Number of Addenda: 0 ?

## 2022-01-21 NOTE — Discharge Instructions (Addendum)
No aspirin or NSAIDs. ?Resume usual medications as before. ?Sucralfate 1 g by mouth 30 to 60 minutes before each meal and at bedtime. ?Resume usual diet. ?No driving for 24 hours. ?Physician will call with biopsy results. ?

## 2022-01-21 NOTE — Transfer of Care (Signed)
Immediate Anesthesia Transfer of Care Note ? ?Patient: Lindsay Lawrence ? ?Procedure(s) Performed: ESOPHAGOGASTRODUODENOSCOPY (EGD) WITH PROPOFOL ?ESOPHAGEAL DILATION ?BIOPSY ? ?Patient Location: Endoscopy Unit ? ?Anesthesia Type:General ? ?Level of Consciousness: awake and patient cooperative ? ?Airway & Oxygen Therapy: Patient Spontanous Breathing ? ?Post-op Assessment: Report given to RN and Post -op Vital signs reviewed and stable ? ?Post vital signs: Reviewed and stable ? ?Last Vitals:  ?Vitals Value Taken Time  ?BP 94/57 0854  ?Temp 98.1 0854  ?Pulse 75 0854  ?Resp 16 0854  ?SpO2 99 0854  ? ? ?Last Pain:  ?Vitals:  ? 01/21/22 0836  ?TempSrc:   ?PainSc: 8   ?   ? ?Patients Stated Pain Goal: 9 (01/21/22 [id number]) ? ?Complications: No notable events documented. ?

## 2022-01-21 NOTE — Progress Notes (Signed)
Lindsay Lawrence is unable to work 01/21/2022 and 01/22/2022.  She may return to work without restrictions on 01/23/2022.  ?

## 2022-01-21 NOTE — H&P (Signed)
Lindsay Lawrence is an 66 y.o. female.   ?Chief Complaint: Patient is here for esophagogastroduodenoscopy and esophageal dilation. ?HPI: Patient is 66 year old Caucasian female with chronic GERD history of peptic ulcer disease status post vagotomy and antrectomy in 2016 who has been experiencing severe epigastric pain with nausea intermittently.  She also continues to experience dysphagia with solids.  She has a history of Schatzki's ring and her esophagus has been dilated in the past.  She did not have ring noted on her last exam year ago.  Last exam revealed stomach full of food debris and therefore esophagus was not dilated.  She denies melena or rectal bleeding.  She has chronic diarrhea. ?She does not take OTC NSAIDs. ? ?Past Medical History:  ?Diagnosis Date  ? Anemia   ? Anxiety   ? Chest pain   ? Nuclear, normal, 2010,  //   hospital August, 2013 no evidence of injury  ? Ejection fraction   ? EF 65%, echo, April 19, 2012  ? Gastric ulcer   ? GERD (gastroesophageal reflux disease)   ? History of palpitations   ? Irritable bowel syndrome   ? Mitral regurgitation   ? Mild, echo, August, 2013  ? Palpitations   ? Reactive airway disease   ? Pulmonary function studies from June, 2013, show the patient does have significant response to bronchodilators.  ? Syncope   ? Vasovagal  ? ? ?Past Surgical History:  ?Procedure Laterality Date  ? ABDOMINAL HYSTERECTOMY    ? BIOPSY  08/04/2016  ? Procedure: BIOPSY;  Surgeon: Rogene Houston, MD;  [facility];  Service: Endoscopy;;  gastric  ? BIOPSY  01/14/2021  ? Procedure: BIOPSY;  Surgeon: Rogene Houston, MD;  [facility];  Service: Endoscopy;;  gastric ?esophageal  ? CATARACT EXTRACTION W/PHACO Left 09/11/2021  ? Procedure: CATARACT EXTRACTION PHACO AND INTRAOCULAR LENS PLACEMENT ([facility]);  Surgeon: Baruch Goldmann, MD;  Location: [facility];  Service: Ophthalmology;  Laterality: Left;  CDE: 5.25  ? CATARACT EXTRACTION W/PHACO Right 10/26/2021  ? Procedure:  CATARACT EXTRACTION PHACO AND INTRAOCULAR LENS PLACEMENT ([facility]);  Surgeon: Baruch Goldmann, MD;  [facility]: [facility];  Service: Ophthalmology;  Laterality: Right;  CDE: 5.08  ? CHOLECYSTECTOMY    ? COLONOSCOPY    ? COLONOSCOPY WITH PROPOFOL N/A 03/04/2017  ? Procedure: COLONOSCOPY WITH PROPOFOL;  Surgeon: Rogene Houston, MD;  [facility];  Service: Endoscopy;  Laterality: N/A;  10:10  ? ESOPHAGEAL DILATION  06/07/2014  ? Procedure: ESOPHAGEAL DILATION;  Surgeon: Rogene Houston, MD;  [facility];  Service: Endoscopy;;  ? ESOPHAGEAL DILATION N/A 08/27/2015  ? Procedure: ESOPHAGEAL DILATION;  Surgeon: Rogene Houston, MD;  [facility];  Service: Endoscopy;  Laterality: N/A;  ? ESOPHAGEAL DILATION  08/04/2016  ? Procedure: ESOPHAGEAL DILATION;  Surgeon: Rogene Houston, MD;  [facility];  Service: Endoscopy;;  ? ESOPHAGEAL DILATION N/A 04/13/2017  ? Procedure: ESOPHAGEAL DILATION;  Surgeon: Rogene Houston, MD;  [facility];  Service: Endoscopy;  Laterality: N/A;  ? ESOPHAGOGASTRODUODENOSCOPY  02/04/2012  ? Procedure: ESOPHAGOGASTRODUODENOSCOPY (EGD);  Surgeon: Rogene Houston, MD;  [facility];  Service: Endoscopy;  Laterality: N/A;  320  ? ESOPHAGOGASTRODUODENOSCOPY  06/02/2012  ? Procedure: ESOPHAGOGASTRODUODENOSCOPY (EGD);  Surgeon: Rogene Houston, MD;  [facility];  Service: Endoscopy;  Laterality: N/A;  1050  ? ESOPHAGOGASTRODUODENOSCOPY N/A 06/07/2014  ? Procedure: ESOPHAGOGASTRODUODENOSCOPY (EGD);  Surgeon: Bernadene Person  Gloriann Loan, MD;  [facility];  Service: Endoscopy;  Laterality: N/A;  1035-rescheduled 9/25 @ 8:30 Ann to notify pt  ? ESOPHAGOGASTRODUODENOSCOPY N/A 02/17/2015  ? Procedure: ESOPHAGOGASTRODUODENOSCOPY (EGD);  Surgeon: Rogene Houston, MD;  [facility];  Service: Endoscopy;  Laterality: N/A;  730  ? ESOPHAGOGASTRODUODENOSCOPY N/A 08/27/2015  ? Procedure: ESOPHAGOGASTRODUODENOSCOPY (EGD);  Surgeon: Rogene Houston, MD;  [facility];  Service: Endoscopy;  Laterality: N/A;  1155  ? ESOPHAGOGASTRODUODENOSCOPY N/A 08/04/2016  ? Procedure: ESOPHAGOGASTRODUODENOSCOPY (EGD);  Surgeon: Rogene Houston, MD;  [facility];  Service: Endoscopy;  Laterality: N/A;  225  ? ESOPHAGOGASTRODUODENOSCOPY N/A 04/13/2017  ? Procedure: ESOPHAGOGASTRODUODENOSCOPY (EGD);  Surgeon: Rogene Houston, MD;  [facility];  Service: Endoscopy;  Laterality: N/A;  ? ESOPHAGOGASTRODUODENOSCOPY (EGD) WITH ESOPHAGEAL DILATION N/A 05/31/2013  ? Procedure: ESOPHAGOGASTRODUODENOSCOPY (EGD) WITH ESOPHAGEAL DILATION;  Surgeon: Rogene Houston, MD;  [facility];  Service: Endoscopy;  Laterality: N/A;  ? ESOPHAGOGASTRODUODENOSCOPY (EGD) WITH PROPOFOL N/A 01/14/2021  ? Procedure: ESOPHAGOGASTRODUODENOSCOPY (EGD) WITH PROPOFOL;  Surgeon: Rogene Houston, MD;  [facility];  Service: Endoscopy;  Laterality: N/A;  11:00 Am  ? FLEXIBLE SIGMOIDOSCOPY N/A 05/31/2013  ? Procedure: FLEXIBLE SIGMOIDOSCOPY;  Surgeon: Rogene Houston, MD;  [facility];  Service: Endoscopy;  Laterality: N/A;  250  ? FLEXIBLE SIGMOIDOSCOPY N/A 06/07/2014  ? Procedure: FLEXIBLE SIGMOIDOSCOPY;  Surgeon: Rogene Houston, MD;  [facility];  Service: Endoscopy;  Laterality: N/A;  ? GIVENS CAPSULE STUDY  05/03/2011  ? Procedure: GIVENS CAPSULE STUDY;  Surgeon: Rogene Houston, MD;  [facility];  Service: Endoscopy;  Laterality: N/A;  7:30 am  ? STOMACH SURGERY    ? 2015- "ulcer surgery".  ? UPPER GASTROINTESTINAL ENDOSCOPY    ? ? ?Family History  ?Problem Relation Age of Onset  ? Healthy Mother   ? Heart attack Father   ? Healthy Sister   ? Colon cancer Neg Hx   ? ?Social History:  reports that she has never smoked. She has never been exposed to tobacco smoke. She has never used smokeless tobacco. She reports that she does not drink alcohol and does not use drugs. ? ?Allergies:  ?Allergies  ?Allergen Reactions   ? Aspirin Other (See Comments)  ?  Ulcer, Reflux  ? Codeine Nausea And Vomiting  ?  Speeds heart up, dizziness  ? Fentanyl Nausea And Vomiting  ?  Confusion, weakness  ? Nsaids Other (See Comments)  ?  reflux, ulcer  ? Propoxyphene N-Acetaminophen Nausea And Vomiting  ?  Speeds heart up, dizziness  ? ? ?Medications Prior to Admission  ?Medication Sig Dispense Refill  ? acetaminophen (TYLENOL) 500 MG tablet Take 500 mg by mouth every 6 (six) hours as needed for headache.    ? ALPRAZolam (XANAX) 0.25 MG tablet Take 1 tablet (0.25 mg total) by mouth 3 (three) times daily as needed. 90 tablet 0  ? Ascorbic Acid (VITAMIN C WITH ROSE HIPS) 1000 MG tablet Take 1,000 mg by mouth daily.    ? Cholecalciferol (VITAMIN D3) 5000 UNITS CAPS Take 5,000 Units by mouth daily.     ? metoprolol tartrate (LOPRESSOR) 25 MG tablet Take 1 tablet by mouth twice daily 180 tablet 2  ? pantoprazole (PROTONIX) 40 MG tablet Take 1 tablet (40 mg total) by mouth daily before breakfast. 90 tablet 3  ? Simethicone (PHAZYME) 180 MG CAPS Take 1 capsule (  180 mg total) by mouth 2 (two) times daily as needed. (Patient taking differently: Take 180 mg by mouth 2 (two) times daily as needed (bloating).)  0  ? diphenoxylate-atropine (LOMOTIL) 2.5-0.025 MG tablet Take 1 tablet by mouth 4 (four) times daily as needed for diarrhea or loose stools. 120 tablet 2  ? ferrous gluconate (FERGON) 324 MG tablet Take 324 mg by mouth daily at 6 (six) AM.    ? metoCLOPramide (REGLAN) 5 MG tablet Take 1 tablet (5 mg total) by mouth 3 (three) times daily before meals. 60 tablet 1  ? Multiple Vitamin (MULTIVITAMIN WITH MINERALS) TABS tablet Take 1 tablet by mouth daily.    ? ondansetron (ZOFRAN) 4 MG tablet Take 1 tablet (4 mg total) by mouth 2 (two) times daily as needed for nausea or vomiting. 30 tablet 0  ? promethazine (PHENADOZ) 25 MG suppository Place 1 suppository (25 mg total) rectally 2 (two) times daily as needed for nausea or vomiting. 12 each 1  ? promethazine  (PHENERGAN) 25 MG tablet Take 1 tablet (25 mg total) by mouth 2 (two) times daily as needed for nausea or vomiting. 20 tablet 0  ? rosuvastatin (CRESTOR) 5 MG tablet Take 5 mg by mouth once a week.    ?

## 2022-01-21 NOTE — Anesthesia Procedure Notes (Signed)
Date/Time: 01/21/2022 8:29 AM ?Performed by: Vista Deck, CRNA ?Pre-anesthesia Checklist: Patient identified, Emergency Drugs available, Suction available, Timeout performed and Patient being monitored ?Patient Re-evaluated:Patient Re-evaluated prior to induction ?Oxygen Delivery Method: Nasal Cannula ? ? ? ? ?

## 2022-01-21 NOTE — Anesthesia Postprocedure Evaluation (Signed)
Anesthesia Post Note ? ?Patient: Lindsay Lawrence ? ?Procedure(s) Performed: ESOPHAGOGASTRODUODENOSCOPY (EGD) WITH PROPOFOL ?ESOPHAGEAL DILATION ?BIOPSY ? ?Patient location during evaluation: Phase II ?Anesthesia Type: General ?Level of consciousness: awake ?Pain management: pain level controlled ?Vital Signs Assessment: post-procedure vital signs reviewed and stable ?Respiratory status: spontaneous breathing and respiratory function stable ?Cardiovascular status: blood pressure returned to baseline and stable ?Postop Assessment: no headache and no apparent nausea or vomiting ?Anesthetic complications: no ?Comments: Late entry ? ? ?No notable events documented. ? ? ?Last Vitals:  ?Vitals:  ? 01/21/22 0714 01/21/22 0853  ?BP: 129/80 (!) 94/57  ?Pulse: 90 74  ?Resp: 12 18  ?Temp: 36.7 ?C 36.7 ?C  ?SpO2: 100% 98%  ?  ?Last Pain:  ?Vitals:  ? 01/21/22 0853  ?TempSrc: Axillary  ?PainSc: 8   ? ? ?  ?  ?  ?  ?  ?  ? ?Windell [place] ? ? ? ? ?

## 2022-01-22 LAB — SURGICAL PATHOLOGY

## 2022-01-28 ENCOUNTER — Encounter (HOSPITAL_COMMUNITY): Payer: Self-pay | Admitting: Internal Medicine

## 2022-01-30 ENCOUNTER — Other Ambulatory Visit (INDEPENDENT_AMBULATORY_CARE_PROVIDER_SITE_OTHER): Payer: Self-pay | Admitting: Internal Medicine

## 2022-01-30 MED ORDER — AMITRIPTYLINE HCL 10 MG PO TABS: 20.0000 mg | ORAL_TABLET | Freq: Every day | ORAL | 2 refills | Status: DC

## 2022-02-03 ENCOUNTER — Telehealth (INDEPENDENT_AMBULATORY_CARE_PROVIDER_SITE_OTHER): Payer: Self-pay | Admitting: *Deleted

## 2022-02-03 NOTE — Telephone Encounter (Signed)
error 

## 2022-02-16 ENCOUNTER — Telehealth (INDEPENDENT_AMBULATORY_CARE_PROVIDER_SITE_OTHER): Payer: Self-pay | Admitting: *Deleted

## 2022-02-16 NOTE — Telephone Encounter (Signed)
Patient seen 01/12/22 and wast to call with update. Started on amitriptyline 10mg  one at night. States she does not see where med has helped any. She does feel more sluggish, tired and weak since starting the med. She had a spell last week where she was having spasms and could not sit down and took a pain med and it did quit. Lasted about 30 mins.  Pt works til 3 every day at .

## 2022-02-17 NOTE — Telephone Encounter (Signed)
Discussed with patient and she verbalized understanding.  

## 2022-02-17 NOTE — Telephone Encounter (Signed)
Per dr Karilyn Cota - go to amitriptyline 20mg  for 2 weeks then call with update. Pt is at work til 3. Call after that

## 2022-03-02 ENCOUNTER — Telehealth (INDEPENDENT_AMBULATORY_CARE_PROVIDER_SITE_OTHER): Payer: Self-pay | Admitting: *Deleted

## 2022-03-02 NOTE — Telephone Encounter (Signed)
Patient calling to give update. She has been taking amtriptyline 10mg  qhs and states she does not notice a difference. She feels groggy and she had one day 4 days ago where her stomach was hurting and she vomited one time.

## 2022-03-04 NOTE — Telephone Encounter (Signed)
Per Dr. Karilyn Cota - stop amtriptyline since its making her groggy. He does not want to go up on dose since causing symptoms.  I called and left patient a message to return call.

## 2022-03-04 NOTE — Telephone Encounter (Signed)
Discussed with patient and she verbalized understanding.  

## 2022-05-13 ENCOUNTER — Other Ambulatory Visit: Payer: Self-pay | Admitting: Cardiology

## 2022-05-31 ENCOUNTER — Telehealth (INDEPENDENT_AMBULATORY_CARE_PROVIDER_SITE_OTHER): Payer: Self-pay | Admitting: *Deleted

## 2022-05-31 NOTE — Telephone Encounter (Signed)
Tried to call patient at work and no answer and her cell number. Voicemail not set up.

## 2022-05-31 NOTE — Telephone Encounter (Signed)
Fax from [facility] requesting refill on alprazolam 0.25mg  one tid prn #90. Last refilled by Dr. Laural Golden. Seen on 01/12/22 for anxiety on 01/12/22. Pharmacy sent request here and marked out Dr. Laural Golden and put Dr. Jenetta Downer. Is this something she should call pcp for since Dr. Laural Golden has retired or can it be refilled?

## 2022-06-01 NOTE — Telephone Encounter (Signed)
Patient notified Vikki Ports not able to fill med as it is not GI related and needs to see pcp regarding further refills. I sent the request to Dr.Vyas office that the pharmacy sent here and patient verbalized understanding to follow up with pcp regarding alprazolam.

## 2022-07-02 ENCOUNTER — Ambulatory Visit: Payer: BC Managed Care – PPO | Attending: Cardiology | Admitting: Cardiology

## 2022-07-02 ENCOUNTER — Encounter: Payer: Self-pay | Admitting: Cardiology

## 2022-07-02 VITALS — BP 118/80 | HR 69 | Ht 64.0 in | Wt 110.6 lb

## 2022-07-02 DIAGNOSIS — R002 Palpitations: Secondary | ICD-10-CM

## 2022-07-02 DIAGNOSIS — I471 Supraventricular tachycardia, unspecified: Secondary | ICD-10-CM

## 2022-07-02 NOTE — Progress Notes (Signed)
Clinical Summary Ms. Lindsay Lawrence is a 66 y.o.female seen today for follow up of the following medical problems.    1. PSVT - has been on lopressor - some recent symptoms. Occurring daily, lasts just few minutes - compliant with meds - limiting caffeine, no EtOH.  - prior issues with low bp's but none recently.       - occasional palpitations, 3-4 times a month. Lasts a few minutes - compliant with meds. 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   Syncope    Vasovagal     Allergies  Allergen Reactions   Aspirin Other (See Comments)    Ulcer, Reflux   Codeine Nausea And Vomiting    Speeds heart up, dizziness   Fentanyl Nausea And Vomiting    Confusion, weakness   Nsaids Other (See Comments)    reflux, ulcer   Propoxyphene N-Acetaminophen Nausea And Vomiting    Speeds heart up, dizziness     Current Outpatient Medications  Medication Sig Dispense Refill   acetaminophen (TYLENOL) 500 MG tablet Take 500 mg by mouth every 6 (six) hours as needed for headache.     ALPRAZolam (XANAX) 0.25 MG tablet Take 1 tablet (0.25 mg total) by mouth 3 (three) times daily as needed. 90 tablet 0   amitriptyline (ELAVIL) 10 MG tablet Take 2 tablets (20 mg total) by mouth at bedtime. 60 tablet 2   Ascorbic Acid (VITAMIN C WITH ROSE HIPS) 1000 MG tablet Take 1,000 mg by mouth daily.      Cholecalciferol (VITAMIN D3) 5000 UNITS CAPS Take 5,000 Units by mouth daily.      diphenoxylate-atropine (LOMOTIL) 2.5-0.025 MG tablet Take 1 tablet by mouth 4 (four) times daily as needed for diarrhea or loose stools. 120 tablet 2   ferrous gluconate (FERGON) 324 MG tablet Take 324 mg by mouth daily at 6 (six) AM.     metoprolol tartrate (LOPRESSOR) 25 MG tablet Take 1 tablet by mouth twice daily 180 tablet 3   Multiple Vitamin (MULTIVITAMIN WITH MINERALS) TABS tablet Take 1 tablet by mouth daily.     ondansetron (ZOFRAN) 4 MG tablet Take 1 tablet (4 mg total) by mouth 2 (two) times daily as needed for nausea or vomiting. 30 tablet 0   pantoprazole (PROTONIX) 40 MG tablet Take 1 tablet (40 mg total) by mouth daily before breakfast. 90 tablet 3   promethazine (PHENADOZ) 25 MG suppository Place 1 suppository (25 mg total) rectally 2 (two) times daily as needed for nausea or vomiting. 12 each 1   promethazine (PHENERGAN) 25 MG tablet Take 1 tablet (25 mg total) by mouth 2 (two) times daily as needed for nausea or vomiting. 20 tablet 0   rosuvastatin (CRESTOR) 5 MG tablet Take 5  mg by mouth once a week.     Simethicone (PHAZYME) 180 MG CAPS Take 1 capsule (180 mg total) by mouth 2 (two) times daily as needed. (Patient taking differently: Take 180 mg by mouth 2 (two) times daily as needed (bloating).)  0   sucralfate (CARAFATE) 1 g tablet Take 1 tablet (1 g total) by mouth 4 (four) times daily -  with meals and at bedtime. 120 tablet 2   vitamin B-12 (CYANOCOBALAMIN) 1000 MCG tablet Take 1,000 mcg by mouth daily.     zinc gluconate 50 MG tablet Take 50 mg by mouth daily.     No current facility-administered medications for this visit.     Past Surgical History:  Procedure Laterality Date   ABDOMINAL HYSTERECTOMY     BIOPSY  08/04/2016   Procedure: BIOPSY;  Surgeon: Rogene Houston, MD;  [facility];  Service: Endoscopy;;  gastric   BIOPSY  01/14/2021   Procedure: BIOPSY;  Surgeon:  Rogene Houston, MD;  [facility];  Service: Endoscopy;;  gastric esophageal   BIOPSY  01/21/2022   Procedure: BIOPSY;  Surgeon: Rogene Houston, MD;  [facility];  Service: Endoscopy;;   CATARACT EXTRACTION W/PHACO Left 09/11/2021   Procedure: CATARACT EXTRACTION PHACO AND INTRAOCULAR LENS PLACEMENT ([place]);  Surgeon: Baruch Goldmann, MD;  [facility];  Service: Ophthalmology;  Laterality: Left;  CDE: 5.25   CATARACT EXTRACTION W/PHACO Right 10/26/2021   Procedure: CATARACT EXTRACTION PHACO AND INTRAOCULAR LENS PLACEMENT ([facility]);  Surgeon: Baruch Goldmann, MD;  [facility];  Service: Ophthalmology;  Laterality: Right;  CDE: 5.08   CHOLECYSTECTOMY     COLONOSCOPY     COLONOSCOPY WITH PROPOFOL N/A 03/04/2017   Procedure: COLONOSCOPY WITH PROPOFOL;  Surgeon: Rogene Houston, MD;  [facility];  Service: Endoscopy;  Laterality: N/A;  10:10   ESOPHAGEAL DILATION  06/07/2014   Procedure: ESOPHAGEAL DILATION;  Surgeon: Rogene Houston, MD;  [facility];  Service: Endoscopy;;   ESOPHAGEAL DILATION N/A 08/27/2015   Procedure: ESOPHAGEAL DILATION;  Surgeon: Rogene Houston, MD;  [facility];  Service: Endoscopy;  Laterality: N/A;   ESOPHAGEAL DILATION  08/04/2016   Procedure: ESOPHAGEAL DILATION;  Surgeon: Rogene Houston, MD;  [facility];  Service: Endoscopy;;   ESOPHAGEAL DILATION N/A 04/13/2017   Procedure: ESOPHAGEAL DILATION;  Surgeon: Rogene Houston, MD;  [facility];  Service: Endoscopy;  Laterality: N/A;   ESOPHAGEAL DILATION N/A 01/21/2022   Procedure: ESOPHAGEAL DILATION;  Surgeon: Rogene Houston, MD;  [facility];  Service: Endoscopy;  Laterality: N/A;   ESOPHAGOGASTRODUODENOSCOPY  02/04/2012   Procedure: ESOPHAGOGASTRODUODENOSCOPY (EGD);  Surgeon: Rogene Houston, MD;  [facility];  Service: Endoscopy;  Laterality: N/A;  320   ESOPHAGOGASTRODUODENOSCOPY  06/02/2012   Procedure:  ESOPHAGOGASTRODUODENOSCOPY (EGD);  Surgeon: Rogene Houston, MD;  [facility];  Service: Endoscopy;  Laterality: N/A;  1050   ESOPHAGOGASTRODUODENOSCOPY N/A 06/07/2014   Procedure: ESOPHAGOGASTRODUODENOSCOPY (EGD);  Surgeon: Rogene Houston, MD;  [facility];  Service: Endoscopy;  Laterality: N/A;  1035-rescheduled 9/25 @ 8:30 Ann to notify pt   ESOPHAGOGASTRODUODENOSCOPY N/A 02/17/2015   Procedure: ESOPHAGOGASTRODUODENOSCOPY (EGD);  Surgeon: Rogene Houston, MD;  [facility];  Service: Endoscopy;  Laterality: N/A;  730   ESOPHAGOGASTRODUODENOSCOPY N/A 08/27/2015   Procedure: ESOPHAGOGASTRODUODENOSCOPY (EGD);  Surgeon: Rogene Houston, MD;  [facility];  Service: Endoscopy;  Laterality: N/A;  1155   ESOPHAGOGASTRODUODENOSCOPY N/A 08/04/2016   Procedure: ESOPHAGOGASTRODUODENOSCOPY (EGD);  Surgeon: Rogene Houston, MD;  [facility];  Service: Endoscopy;  Laterality: N/A;  225   ESOPHAGOGASTRODUODENOSCOPY N/A 04/13/2017   Procedure: ESOPHAGOGASTRODUODENOSCOPY (EGD);  Surgeon: Rogene Houston, MD;  [facility];  Service: Endoscopy;  Laterality: N/A;   ESOPHAGOGASTRODUODENOSCOPY (EGD) WITH ESOPHAGEAL DILATION N/A 05/31/2013   Procedure: ESOPHAGOGASTRODUODENOSCOPY (EGD) WITH ESOPHAGEAL DILATION;  Surgeon: Rogene Houston, MD;  [facility];  Service: Endoscopy;  Laterality: N/A;   ESOPHAGOGASTRODUODENOSCOPY (EGD) WITH PROPOFOL N/A 01/14/2021   Procedure: ESOPHAGOGASTRODUODENOSCOPY (EGD) WITH PROPOFOL;  Surgeon: Rogene Houston, MD;  [facility];  Service: Endoscopy;  Laterality: N/A;  11:00 Am   ESOPHAGOGASTRODUODENOSCOPY (EGD) WITH PROPOFOL N/A 01/21/2022   Procedure: ESOPHAGOGASTRODUODENOSCOPY (EGD) WITH PROPOFOL;  Surgeon: Rogene Houston, MD;  [facility];  Service: Endoscopy;  Laterality: N/A;  [place] N/A 05/31/2013   Procedure: FLEXIBLE SIGMOIDOSCOPY;  Surgeon: Rogene Houston,  MD;  [facility];  Service: Endoscopy;  Laterality: N/A;  [place] N/A 06/07/2014   Procedure: FLEXIBLE SIGMOIDOSCOPY;  Surgeon: Rogene Houston, MD;  [facility];  Service: Endoscopy;  Laterality: N/A;   GIVENS CAPSULE STUDY  05/03/2011   Procedure: GIVENS CAPSULE STUDY;  Surgeon: Rogene Houston, MD;  [facility];  Service: Endoscopy;  Laterality: N/A;  7:30 am   STOMACH SURGERY     2015- "ulcer surgery".   UPPER GASTROINTESTINAL ENDOSCOPY       Allergies  Allergen Reactions   Aspirin Other (See Comments)    Ulcer, Reflux   Codeine Nausea And Vomiting    Speeds heart up, dizziness   Fentanyl Nausea And Vomiting    Confusion, weakness   Nsaids Other (See Comments)    reflux, ulcer   Propoxyphene N-Acetaminophen Nausea And Vomiting    Speeds heart up, dizziness      Family History  Problem Relation Age of Onset   Healthy Mother    Heart attack Father    Healthy Sister    Colon cancer Neg Hx      Social History Ms. Ungerman reports that she has never smoked. She has never been exposed to tobacco smoke. She has never used smokeless tobacco. Ms. Montecillo reports no history of alcohol use.   Review of Systems CONSTITUTIONAL: No weight loss, fever, chills, weakness or fatigue.  HEENT: Eyes: No visual loss, blurred vision, double vision or yellow sclerae.No hearing loss, sneezing, congestion, runny nose or sore throat.  SKIN: No rash or itching.  CARDIOVASCULAR: per hpi RESPIRATORY: No shortness of breath, cough or sputum.  GASTROINTESTINAL: No anorexia, nausea, vomiting or diarrhea. No abdominal pain or blood.  GENITOURINARY: No burning on urination, no polyuria NEUROLOGICAL: No headache, dizziness, syncope, paralysis, ataxia, numbness or tingling in the extremities. No change in bowel or bladder control.  MUSCULOSKELETAL: No muscle, back pain, joint pain or stiffness.  LYMPHATICS: No enlarged nodes. No history of  splenectomy.  PSYCHIATRIC: No history of depression or anxiety.  ENDOCRINOLOGIC: No reports of sweating, cold or heat intolerance. No polyuria or polydipsia.  Marland Kitchen   Physical Examination Today's Vitals   07/02/22 1542  BP: 118/80  Pulse: 69  SpO2: 98%  Weight: 110 lb 9.6 oz (50.2 kg)  Height: 5\' 4"  (1.626 m)   Body mass index is 18.98 kg/m.  Gen: resting comfortably,  no acute distress HEENT: no scleral icterus, pupils equal round and reactive, no palptable cervical adenopathy,  CV: RRR, no m/r/g no jvd Resp: Clear to auscultation bilaterally GI: abdomen is soft, non-tender, non-distended, normal bowel sounds, no hepatosplenomegaly MSK: extremities are warm, no edema.  Skin: warm, no rash Neuro:  no focal deficits Psych: appropriate affect   Diagnostic Studies     Assessment and Plan    1. PSVT/Palpitations -infrequent symptoms, we discussed possibly increasing lopressor but reports symptoms not bad enough at this time - continue current meds     Arnoldo Lenis, M.D.

## 2022-07-02 NOTE — Patient Instructions (Signed)

## 2022-07-14 ENCOUNTER — Encounter: Payer: Self-pay | Admitting: *Deleted

## 2022-07-15 ENCOUNTER — Encounter (INDEPENDENT_AMBULATORY_CARE_PROVIDER_SITE_OTHER): Payer: Self-pay | Admitting: Gastroenterology

## 2022-07-15 ENCOUNTER — Ambulatory Visit (INDEPENDENT_AMBULATORY_CARE_PROVIDER_SITE_OTHER): Payer: BC Managed Care – PPO | Admitting: Gastroenterology

## 2022-07-15 VITALS — BP 103/71 | HR 67 | Temp 97.5°F | Ht 63.0 in | Wt 107.9 lb

## 2022-07-15 DIAGNOSIS — K580 Irritable bowel syndrome with diarrhea: Secondary | ICD-10-CM

## 2022-07-15 DIAGNOSIS — R110 Nausea: Secondary | ICD-10-CM | POA: Diagnosis not present

## 2022-07-15 DIAGNOSIS — K219 Gastro-esophageal reflux disease without esophagitis: Secondary | ICD-10-CM

## 2022-07-15 DIAGNOSIS — K3184 Gastroparesis: Secondary | ICD-10-CM

## 2022-07-15 DIAGNOSIS — R1310 Dysphagia, unspecified: Secondary | ICD-10-CM

## 2022-07-15 MED ORDER — PANTOPRAZOLE SODIUM 40 MG PO TBEC: 40.0000 mg | DELAYED_RELEASE_TABLET | Freq: Every day | ORAL | 3 refills | Status: DC

## 2022-07-15 MED ORDER — SUCRALFATE 1 G PO TABS: 1.0000 g | ORAL_TABLET | Freq: Three times a day (TID) | ORAL | 1 refills | Status: DC

## 2022-07-15 MED ORDER — PROMETHAZINE HCL 25 MG PO TABS: 25.0000 mg | ORAL_TABLET | Freq: Two times a day (BID) | ORAL | 0 refills | Status: AC | PRN

## 2022-07-15 MED ORDER — ONDANSETRON HCL 4 MG PO TABS: 4.0000 mg | ORAL_TABLET | Freq: Two times a day (BID) | ORAL | 1 refills | Status: AC | PRN

## 2022-07-15 MED ORDER — PROMETHAZINE HCL 25 MG RE SUPP: 25.0000 mg | Freq: Two times a day (BID) | RECTAL | 1 refills | Status: AC | PRN

## 2022-07-15 MED ORDER — DICYCLOMINE HCL 10 MG PO CAPS: 10.0000 mg | ORAL_CAPSULE | Freq: Two times a day (BID) | ORAL | 0 refills | Status: AC | PRN

## 2022-07-15 NOTE — Progress Notes (Signed)
Maylon Peppers, M.D. Gastroenterology & Hepatology [facility] [address] [place],  [address]  Primary Care Physician: Glenda Chroman, MD [place] [id number]  I will communicate my assessment and recommendations to the referring MD via EMR.  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 female with IBS-D, gastrectomy induced gastroparesis,GERD, anxiety, who presents for follow up of IBS-D , dysphagia and gastroparesis.  The patient was last seen on 01/12/2022. At that time, the patient was given a new prescription for alprazolam, diphenoxylate and Reglan. She was scheduled to undergo EGD with findings described below.  Patient reports that she is still presenting pain in her abdomen. Patient reports that she has some tenderness in the upper abdomen frequently but it is not very painful. Sometimes has some nausea but does not vomit. Phenergan suppositories and PO can help for nausea. States the pain is not very severe and is relatively well controlled. She was diagnosed with postsurgical gastroparesis based on GES on 01/27/2021. She was prescribed Reglan but decided not to take the medication due to side effects.  States her dysphagia somewhat improved with the empiric dilation, as it is not as severe as it was in the past. At least once a week she has one episode of dysphagia with solid food, does not need to vomit the food but she reports she feels it takes longer to go down.   Has 1-3 Bms per day with watery consistency, which has not changed compared to the past. May take Lomotil PRN very occasionally if the diarrhea is severe.   States her GERD is well controlled: as long as she takes Protonix, she won't have any heartburn.  The patient denies having any vomiting, fever, chills, hematochezia, melena, hematemesis, jaundice, pruritus. Has  lost 3 lb since the last time in clinic, but she has a good appetite.  Last EGD: 01/21/2022 - Normal hypopharynx. - Normal esophagus. - Z-line irregular, 40 cm from the incisors. - No endoscopic esophageal abnormality to explain patient's dysphagia. Esophagus dilated. Dilated. - Patent Billroth I gastroduodenostomy was found, characterized by edema, erythema and friable mucosa. Biopsied. - Normal second portion of the duodenum, third portion of the duodenum and fourth portion of the duodenum. Comment: ? Bile induced gastritis  Path: FINAL MICROSCOPIC DIAGNOSIS:   A. STOMACH, BODY, BIOPSY:  - Gastric oxyntic mucosa with prominent nonspecific reactive gastropathy  - Helicobacter pylori-like organisms are not identified on routine HE  stain   Last Colonoscopy: 2018 - Diverticulosis. - The entire examined colon is normal. Random biopsies taken from mucosa of sigmoid colon. - Scar in the rectum. - External hemorrhoids. - Anal papilla(e) were hypertrophied.  Past Medical History: Past Medical History:  Diagnosis Date   Anemia    Anxiety    Chest pain    Nuclear, normal, 2010,  //   hospital August, 2013 no evidence of injury   Ejection fraction    EF 65%, echo, April 19, 2012   Gastric ulcer    GERD (gastroesophageal reflux disease)    History of palpitations    Irritable bowel syndrome    Mitral regurgitation    Mild, echo, August, 2013   Palpitations    Reactive airway disease    Pulmonary function studies from June, 2013, show the patient does have significant response to bronchodilators.   Syncope    Vasovagal    Past Surgical  History: Past Surgical History:  Procedure Laterality Date   ABDOMINAL HYSTERECTOMY     BIOPSY  08/04/2016   Procedure: BIOPSY;  Surgeon: Malissa Hippo, MD;  [facility];  Service: Endoscopy;;  gastric   BIOPSY  01/14/2021   Procedure: BIOPSY;  Surgeon: Malissa Hippo, MD;  [facility];  Service: Endoscopy;;   gastric esophageal   BIOPSY  01/21/2022   Procedure: BIOPSY;  Surgeon: Malissa Hippo, MD;  [facility];  Service: Endoscopy;;   CATARACT EXTRACTION W/PHACO Left 09/11/2021   Procedure: CATARACT EXTRACTION PHACO AND INTRAOCULAR LENS PLACEMENT (IOC);  Surgeon: Fabio Pierce, MD;  Location: [facility];  Service: Ophthalmology;  Laterality: Left;  CDE: 5.25   CATARACT EXTRACTION W/PHACO Right 10/26/2021   Procedure: CATARACT EXTRACTION PHACO AND INTRAOCULAR LENS PLACEMENT (IOC);  Surgeon: Fabio Pierce, MD;  Location: [facility];  Service: Ophthalmology;  Laterality: Right;  CDE: 5.08   CHOLECYSTECTOMY     COLONOSCOPY     COLONOSCOPY WITH PROPOFOL N/A 03/04/2017   Procedure: COLONOSCOPY WITH PROPOFOL;  Surgeon: Malissa Hippo, MD;  [facility];  Service: Endoscopy;  Laterality: N/A;  10:10   ESOPHAGEAL DILATION  06/07/2014   Procedure: ESOPHAGEAL DILATION;  Surgeon: Malissa Hippo, MD;  [facility];  Service: Endoscopy;;   ESOPHAGEAL DILATION N/A 08/27/2015   Procedure: ESOPHAGEAL DILATION;  Surgeon: Malissa Hippo, MD;  [facility];  Service: Endoscopy;  Laterality: N/A;   ESOPHAGEAL DILATION  08/04/2016   Procedure: ESOPHAGEAL DILATION;  Surgeon: Malissa Hippo, MD;  [facility];  Service: Endoscopy;;   ESOPHAGEAL DILATION N/A 04/13/2017   Procedure: ESOPHAGEAL DILATION;  Surgeon: Malissa Hippo, MD;  [facility];  Service: Endoscopy;  Laterality: N/A;   ESOPHAGEAL DILATION N/A 01/21/2022   Procedure: ESOPHAGEAL DILATION;  Surgeon: Malissa Hippo, MD;  [facility];  Service: Endoscopy;  Laterality: N/A;   ESOPHAGOGASTRODUODENOSCOPY  02/04/2012   Procedure: ESOPHAGOGASTRODUODENOSCOPY (EGD);  Surgeon: Malissa Hippo, MD;  [facility];  Service: Endoscopy;  Laterality: N/A;  320   ESOPHAGOGASTRODUODENOSCOPY  06/02/2012   Procedure: ESOPHAGOGASTRODUODENOSCOPY (EGD);  Surgeon: Malissa Hippo, MD;   [facility];  Service: Endoscopy;  Laterality: N/A;  1050   ESOPHAGOGASTRODUODENOSCOPY N/A 06/07/2014   Procedure: ESOPHAGOGASTRODUODENOSCOPY (EGD);  Surgeon: Malissa Hippo, MD;  [facility];  Service: Endoscopy;  Laterality: N/A;  1035-rescheduled 9/25 @ 8:30 Ann to notify pt   ESOPHAGOGASTRODUODENOSCOPY N/A 02/17/2015   Procedure: ESOPHAGOGASTRODUODENOSCOPY (EGD);  Surgeon: Malissa Hippo, MD;  [facility];  Service: Endoscopy;  Laterality: N/A;  730   ESOPHAGOGASTRODUODENOSCOPY N/A 08/27/2015   Procedure: ESOPHAGOGASTRODUODENOSCOPY (EGD);  Surgeon: Malissa Hippo, MD;  [facility];  Service: Endoscopy;  Laterality: N/A;  1155   ESOPHAGOGASTRODUODENOSCOPY N/A 08/04/2016   Procedure: ESOPHAGOGASTRODUODENOSCOPY (EGD);  Surgeon: Malissa Hippo, MD;  [facility];  Service: Endoscopy;  Laterality: N/A;  225   ESOPHAGOGASTRODUODENOSCOPY N/A 04/13/2017   Procedure: ESOPHAGOGASTRODUODENOSCOPY (EGD);  Surgeon: Malissa Hippo, MD;  [facility];  Service: Endoscopy;  Laterality: N/A;   ESOPHAGOGASTRODUODENOSCOPY (EGD) WITH ESOPHAGEAL DILATION N/A 05/31/2013   Procedure: ESOPHAGOGASTRODUODENOSCOPY (EGD) WITH ESOPHAGEAL DILATION;  Surgeon: Malissa Hippo, MD;  [facility];  Service: Endoscopy;  Laterality: N/A;   ESOPHAGOGASTRODUODENOSCOPY (EGD) WITH PROPOFOL N/A 01/14/2021   Procedure: ESOPHAGOGASTRODUODENOSCOPY (EGD) WITH PROPOFOL;  Surgeon: Malissa Hippo,  MD;  [facility];  Service: Endoscopy;  Laterality: N/A;  11:00 Am   ESOPHAGOGASTRODUODENOSCOPY (EGD) WITH PROPOFOL N/A 01/21/2022   Procedure: ESOPHAGOGASTRODUODENOSCOPY (EGD) WITH PROPOFOL;  Surgeon: Malissa Hippo, MD;  [facility];  Service: Endoscopy;  Laterality: N/A;  830   FLEXIBLE SIGMOIDOSCOPY N/A 05/31/2013   Procedure: FLEXIBLE SIGMOIDOSCOPY;  Surgeon: Malissa Hippo, MD;  [facility];  Service: Endoscopy;  Laterality:  N/A;  250   FLEXIBLE SIGMOIDOSCOPY N/A 06/07/2014   Procedure: FLEXIBLE SIGMOIDOSCOPY;  Surgeon: Malissa Hippo, MD;  [facility];  Service: Endoscopy;  Laterality: N/A;   GIVENS CAPSULE STUDY  05/03/2011   Procedure: GIVENS CAPSULE STUDY;  Surgeon: Malissa Hippo, MD;  [facility];  Service: Endoscopy;  Laterality: N/A;  7:30 am   STOMACH SURGERY     2015- "ulcer surgery".   UPPER GASTROINTESTINAL ENDOSCOPY      Family History: Family History  Problem Relation Age of Onset   Healthy Mother    Heart attack Father    Healthy Sister    Colon cancer Neg Hx     Social History: Social History   Tobacco Use  Smoking Status Never   Passive exposure: Never  Smokeless Tobacco Never   Social History   Substance and Sexual Activity  Alcohol Use No   Alcohol/week: 0.0 standard drinks of alcohol   Social History   Substance and Sexual Activity  Drug Use No    Allergies: Allergies  Allergen Reactions   Aspirin Other (See Comments)    Ulcer, Reflux   Codeine Nausea And Vomiting    Speeds heart up, dizziness   Fentanyl Nausea And Vomiting    Confusion, weakness   Nsaids Other (See Comments)    reflux, ulcer   Propoxyphene N-Acetaminophen Nausea And Vomiting    Speeds heart up, dizziness    Medications: Current Outpatient Medications  Medication Sig Dispense Refill   acetaminophen (TYLENOL) 500 MG tablet Take 500 mg by mouth every 6 (six) hours as needed for headache.     ALPRAZolam (XANAX) 0.25 MG tablet Take 1 tablet (0.25 mg total) by mouth 3 (three) times daily as needed. 90 tablet 0   Ascorbic Acid (VITAMIN C WITH ROSE HIPS) 1000 MG tablet Take 1,000 mg by mouth daily.     Cholecalciferol (VITAMIN D3) 5000 UNITS CAPS Take 5,000 Units by mouth daily.      diphenoxylate-atropine (LOMOTIL) 2.5-0.025 MG tablet Take 1 tablet by mouth 4 (four) times daily as needed for diarrhea or loose stools. 120 tablet 2   ferrous gluconate (FERGON) 324 MG tablet  Take 324 mg by mouth daily at 6 (six) AM.     metoprolol tartrate (LOPRESSOR) 25 MG tablet Take 1 tablet by mouth twice daily 180 tablet 3   Multiple Vitamin (MULTIVITAMIN WITH MINERALS) TABS tablet Take 1 tablet by mouth daily.     ondansetron (ZOFRAN) 4 MG tablet Take 1 tablet (4 mg total) by mouth 2 (two) times daily as needed for nausea or vomiting. 30 tablet 0   pantoprazole (PROTONIX) 40 MG tablet Take 1 tablet (40 mg total) by mouth daily before breakfast. 90 tablet 3   promethazine (PHENADOZ) 25 MG suppository Place 1 suppository (25 mg total) rectally 2 (two) times daily as needed for nausea or vomiting. 12 each 1   promethazine (PHENERGAN) 25 MG tablet Take 1 tablet (25 mg total) by mouth 2 (two) times daily as needed for nausea or  vomiting. 20 tablet 0   rosuvastatin (CRESTOR) 5 MG tablet Take 5 mg by mouth daily.     Simethicone (PHAZYME) 180 MG CAPS Take 1 capsule (180 mg total) by mouth 2 (two) times daily as needed. (Patient taking differently: Take 180 mg by mouth 2 (two) times daily as needed (bloating).)  0   sucralfate (CARAFATE) 1 g tablet Take 1 tablet (1 g total) by mouth 4 (four) times daily -  with meals and at bedtime. (Patient taking differently: Take 1 g by mouth 4 (four) times daily -  with meals and at bedtime. As needed) 120 tablet 2   vitamin B-12 (CYANOCOBALAMIN) 1000 MCG tablet Take 1,000 mcg by mouth daily.     zinc gluconate 50 MG tablet Take 50 mg by mouth daily.     No current facility-administered medications for this visit.    Review of Systems: GENERAL: negative for malaise, night sweats HEENT: No changes in hearing or vision, no nose bleeds or other nasal problems. NECK: Negative for lumps, goiter, pain and significant neck swelling RESPIRATORY: Negative for cough, wheezing CARDIOVASCULAR: Negative for chest pain, leg swelling, palpitations, orthopnea GI: SEE HPI MUSCULOSKELETAL: Negative for joint pain or swelling, back pain, and muscle pain. SKIN:  Negative for lesions, rash PSYCH: Negative for sleep disturbance, mood disorder and recent psychosocial stressors. HEMATOLOGY Negative for prolonged bleeding, bruising easily, and swollen nodes. ENDOCRINE: Negative for cold or heat intolerance, polyuria, polydipsia and goiter. NEURO: negative for tremor, gait imbalance, syncope and seizures. The remainder of the review of systems is noncontributory.   Physical Exam: BP 103/71 (BP Location: Left Arm, Patient Position: Sitting, Cuff Size: Small)   Pulse 67   Temp (!) 97.5 F (36.4 C) (Temporal)   Ht 5\' 3"  (1.6 m)   Wt 107 lb 14.4 oz (48.9 kg)   BMI 19.11 kg/m  GENERAL: The patient is AO x3, in no acute distress. HEENT: Head is normocephalic and atraumatic. EOMI are intact. Mouth is well hydrated and without lesions. NECK: Supple. No masses LUNGS: Clear to auscultation. No presence of rhonchi/wheezing/rales. Adequate chest expansion HEART: RRR, normal s1 and s2. ABDOMEN: tender to palpation int eh abdomen diffusely but worse in the epigastric area, no guarding, no peritoneal signs, and nondistended. BS +. No masses. EXTREMITIES: Without any cyanosis, clubbing, rash, lesions or edema. NEUROLOGIC: AOx3, no focal motor deficit. SKIN: no jaundice, no rashes  Imaging/Labs: as above  I personally reviewed and interpreted the available labs, imaging and endoscopic files.  Impression and Plan: KALIANNE FETTING is a 66 y.o. female with IBS-D, gastrectomy induced gastroparesis,GERD, anxiety, who presents for follow up of IBS-D , dysphagia and gastroparesis. The patient has presented relative improvement of her symptoms with the current medication regimen. We discussed that most of her abdominal symptoms and nausea are likely related to her postsurgical induced gastroparesis - she is not interested in taking Reglan/domperidone due to concern for side effects which I consider reasonable. She has not presented any sever weight loss or failure to  thrive, possibly can manage gastroparesis with dietary changes and use of promethazine/zofran as needed. Part of her abdominal pain is likely functional in nature, for which she can take Bentyl as needed.  Regarding her IBS-D, she can continue with Lomotil as needed as well.  Finally, her dysphagia seems to be improving. If it were to worsen in the future, may need to investigate this further with a barium esophagram and potential esophageal manometry, if agreeable.  -Continue promethazine as  needed for nausea -Continue pantoprazole 40 mg qday -Continue Zofran as needed for nausea -Continue Carafate as needed  -Start Bentyl 1 tablet q12h as needed for abdominal pain -If recurrent problems swallowing, may need to proceed with barium esophagram and potential esophageal manometry  All questions were answered.      Katrinka Blazinganiel Castaneda, MD Gastroenterology and Hepatology [facility]

## 2022-07-15 NOTE — Patient Instructions (Addendum)
Continue promethazine as needed for nausea Continue pantoprazole 40 mg qday Continue Zofran as needed for nausea Continue Carafate as needed  Start Bentyl 1 tablet q12h as needed for abdominal pain If recurrent problems swallowing, may need to proceed with barium esophagram

## 2022-07-16 ENCOUNTER — Telehealth (INDEPENDENT_AMBULATORY_CARE_PROVIDER_SITE_OTHER): Payer: Self-pay

## 2022-07-16 NOTE — Telephone Encounter (Signed)
Patient called Lindsay Lawrence and asked to be placed on schedule to be seen in six months when her husband is scheduled. Per Dr. Jenetta Downer he did not feel the patient needed to be seen in six months, rather she was doing so well he did not need to see her any sooner than what he had recommended at her ov yesterday which was for her to be seen in a year here at the office. I did reiterate that if she develops any new issues to please call and we would be happy to see her before the one year. Patient states understanding.

## 2022-07-16 NOTE — Telephone Encounter (Signed)
Thanks Crystal. If new issues arise or her symptoms were to worsen, I would be happy to see her earlier. Thanks

## 2023-01-10 ENCOUNTER — Other Ambulatory Visit (INDEPENDENT_AMBULATORY_CARE_PROVIDER_SITE_OTHER): Payer: Self-pay | Admitting: Gastroenterology

## 2023-01-10 DIAGNOSIS — K219 Gastro-esophageal reflux disease without esophagitis: Secondary | ICD-10-CM

## 2023-01-14 ENCOUNTER — Telehealth (INDEPENDENT_AMBULATORY_CARE_PROVIDER_SITE_OTHER): Payer: Self-pay | Admitting: *Deleted

## 2023-01-14 ENCOUNTER — Other Ambulatory Visit (INDEPENDENT_AMBULATORY_CARE_PROVIDER_SITE_OTHER): Payer: Self-pay

## 2023-01-14 NOTE — Telephone Encounter (Signed)
Patient wanted to have ferritin checked. Last ov was 07/15/22. Last ferritin was done on 01/18/22. Please advise if any labs are needed. ( Her husband has appt on Monday )

## 2023-01-14 NOTE — Telephone Encounter (Signed)
Thanks for the update Agree with what was discussed but we will see her given her anxiety

## 2023-01-14 NOTE — Telephone Encounter (Signed)
Per Patient husband he wanted the patient to have his appointment on Monday 01/17/2023, but says he really needs to be seen also, or just have his labs drawn. Patient husband spoke with Mitzy the receptionist and told her that the patient Lindsay Lawrence is depressed because she feels this office does not care about her, as we would not give her an appointment at the same time as her husbands.(The office has spoke with the patient in the past that Doctor Levon Hedger felt the patient was in good enough health to not need an immediate appointment at the same time of her husband, next appointment per Dr.Castaneda was for one year) .We tried to explain the patient did not need an appointment that we could just have her labs drawn and let her know the results, they did not think this was sufficient. Per Mitzy we had an opening on Monday at 9:30 am, she advised the patient spouse to keep his appointment on Monday at 8:30 am and have her Marice come in at 9;30 am.

## 2023-01-14 NOTE — Telephone Encounter (Signed)
Please send an order for CBC, iron panel and ferritin Thanks

## 2023-01-17 ENCOUNTER — Ambulatory Visit (INDEPENDENT_AMBULATORY_CARE_PROVIDER_SITE_OTHER): Payer: BC Managed Care – PPO | Admitting: Gastroenterology

## 2023-01-17 ENCOUNTER — Encounter (INDEPENDENT_AMBULATORY_CARE_PROVIDER_SITE_OTHER): Payer: Self-pay | Admitting: Gastroenterology

## 2023-01-17 VITALS — BP 117/75 | HR 72 | Temp 97.3°F | Ht 64.0 in | Wt 108.3 lb

## 2023-01-17 DIAGNOSIS — K580 Irritable bowel syndrome with diarrhea: Secondary | ICD-10-CM | POA: Diagnosis not present

## 2023-01-17 DIAGNOSIS — R790 Abnormal level of blood mineral: Secondary | ICD-10-CM

## 2023-01-17 DIAGNOSIS — E559 Vitamin D deficiency, unspecified: Secondary | ICD-10-CM | POA: Diagnosis not present

## 2023-01-17 DIAGNOSIS — K3184 Gastroparesis: Secondary | ICD-10-CM | POA: Diagnosis not present

## 2023-01-17 DIAGNOSIS — K219 Gastro-esophageal reflux disease without esophagitis: Secondary | ICD-10-CM | POA: Diagnosis not present

## 2023-01-17 DIAGNOSIS — R1310 Dysphagia, unspecified: Secondary | ICD-10-CM

## 2023-01-17 NOTE — Progress Notes (Signed)
Lindsay Lawrence, M.D. Gastroenterology & Hepatology [facility] [address] [place], [place] [address]  Primary Care Physician: Ignatius Specking, MD [address] [place] [address]  I will communicate my assessment and recommendations to the referring MD via EMR.  Problems: Gastroparesis due to partial gastrectomy for large gastric ulcers IBS-D Chronic dysphagia  History of Present Illness: Lindsay Lawrence is a 67 y.o. female with IBS-D, gastrectomy induced gastroparesis,GERD, anxiety, who presents for follow up of IBS-D , dysphagia and gastroparesis.   The patient was last seen on 07/15/2022. At that time, the patient was continued on promethazine and Zofran as needed for nausea.  Also was continued on pantoprazole 40 mg every day.  She was started on Bentyl as needed for abdominal pain episodes.  Patient reports  that she has presented chronic epigastric pain, which can worsen after she eats. Severe pain may last for 20 minutes and eventually eases off. She has not vomited but reports having nausea when she has the abdominal pain episodes.  States that this is similar to the pain she has had in the past but thinks that sometimes it is GERD worse in severity.  She reports having similar dysphagia severity compared to how it was in the past, with exacerbations occasionally. May have some lower retrosternal pain occasionally. Very occasionally has heartburn, believes it is related to the type of food that she eats.  She has diarrhea every couple of days. May have 2-3 episodes of watery diarrhea per day. May use Lomotil if the symptoms are severe.  Patient reports she has been craving more ice than than usual.  Notably, her last iron panel on 01/12/2022 showed low ferritin of 11, iron saturation of 31 and iron of 130.  States she does not want to do any invasive testing as of now.  Still not interested in taking medications for gastroparesis.  She is  currently taking oral vitamin D but would like to have it checked as she has felt some weakness. Would like to have some her iron studies checked as well.  The patient denies having any nausea, vomiting, fever, chills, hematochezia, melena, hematemesis, jaundice, pruritus or weight loss.  Last EGD: 01/21/2022 - Normal hypopharynx. - Normal esophagus. - Z-line irregular, 40 cm from the incisors. - No endoscopic esophageal abnormality to explain patient's dysphagia. Esophagus dilated. Dilated. - Patent Billroth I gastroduodenostomy was found, characterized by edema, erythema and friable mucosa. Biopsied. - Normal second portion of the duodenum, third portion of the duodenum and fourth portion of the duodenum. Comment: ? Bile induced gastritis   Path: FINAL MICROSCOPIC DIAGNOSIS:   A. STOMACH, BODY, BIOPSY:  - Gastric oxyntic mucosa with prominent nonspecific reactive gastropathy  - Helicobacter pylori-like organisms are not identified on routine HE  stain    Last Colonoscopy: 2018 - Diverticulosis. - The entire examined colon is normal. Random biopsies taken from mucosa of sigmoid colon. - Scar in the rectum. - External hemorrhoids. - Anal papilla(e) were hypertrophied.  Past Medical History: Past Medical History:  Diagnosis Date   Anemia    Anxiety    Chest pain    Nuclear, normal, 2010,  //   hospital August, 2013 no evidence of injury   Ejection fraction    EF 65%, echo, April 19, 2012   Gastric ulcer    GERD (gastroesophageal reflux disease)    History of palpitations    Irritable bowel syndrome    Mitral regurgitation  Mild, echo, August, 2013   Palpitations    Reactive airway disease    Pulmonary function studies from June, 2013, show the patient does have significant response to bronchodilators.   Syncope    Vasovagal    Past Surgical History: Past Surgical History:  Procedure Laterality Date   ABDOMINAL HYSTERECTOMY     BIOPSY  08/04/2016   Procedure:  BIOPSY;  Surgeon: Malissa Hippo, MD;  [facility];  Service: Endoscopy;;  gastric   BIOPSY  01/14/2021   Procedure: BIOPSY;  Surgeon: Malissa Hippo, MD;  [facility];  Service: Endoscopy;;  gastric esophageal   BIOPSY  01/21/2022   Procedure: BIOPSY;  Surgeon: Malissa Hippo, MD;  [facility];  Service: Endoscopy;;   CATARACT EXTRACTION W/PHACO Left 09/11/2021   Procedure: CATARACT EXTRACTION PHACO AND INTRAOCULAR LENS PLACEMENT (IOC);  Surgeon: Fabio Pierce, MD;  [facility]: [facility];  Service: Ophthalmology;  Laterality: Left;  CDE: 5.25   CATARACT EXTRACTION W/PHACO Right 10/26/2021   Procedure: CATARACT EXTRACTION PHACO AND INTRAOCULAR LENS PLACEMENT (IOC);  Surgeon: Fabio Pierce, MD;  [facility]: [facility];  Service: Ophthalmology;  Laterality: Right;  CDE: 5.08   CHOLECYSTECTOMY     COLONOSCOPY     COLONOSCOPY WITH PROPOFOL N/A 03/04/2017   Procedure: COLONOSCOPY WITH PROPOFOL;  Surgeon: Malissa Hippo, MD;  [facility];  Service: Endoscopy;  Laterality: N/A;  10:10   ESOPHAGEAL DILATION  06/07/2014   Procedure: ESOPHAGEAL DILATION;  Surgeon: Malissa Hippo, MD;  [facility];  Service: Endoscopy;;   ESOPHAGEAL DILATION N/A 08/27/2015   Procedure: ESOPHAGEAL DILATION;  Surgeon: Malissa Hippo, MD;  [facility];  Service: Endoscopy;  Laterality: N/A;   ESOPHAGEAL DILATION  08/04/2016   Procedure: ESOPHAGEAL DILATION;  Surgeon: Malissa Hippo, MD;  [facility];  Service: Endoscopy;;   ESOPHAGEAL DILATION N/A 04/13/2017   Procedure: ESOPHAGEAL DILATION;  Surgeon: Malissa Hippo, MD;  [facility];  Service: Endoscopy;  Laterality: N/A;   ESOPHAGEAL DILATION N/A 01/21/2022   Procedure: ESOPHAGEAL DILATION;  Surgeon: Malissa Hippo, MD;  [facility];  Service: Endoscopy;  Laterality: N/A;   ESOPHAGOGASTRODUODENOSCOPY  02/04/2012   Procedure: ESOPHAGOGASTRODUODENOSCOPY (EGD);  Surgeon:  Malissa Hippo, MD;  [facility];  Service: Endoscopy;  Laterality: N/A;  320   ESOPHAGOGASTRODUODENOSCOPY  06/02/2012   Procedure: ESOPHAGOGASTRODUODENOSCOPY (EGD);  Surgeon: Malissa Hippo, MD;  [facility];  Service: Endoscopy;  Laterality: N/A;  1050   ESOPHAGOGASTRODUODENOSCOPY N/A 06/07/2014   Procedure: ESOPHAGOGASTRODUODENOSCOPY (EGD);  Surgeon: Malissa Hippo, MD;  [facility];  Service: Endoscopy;  Laterality: N/A;  1035-rescheduled 9/25 @ 8:30 Ann to notify pt   ESOPHAGOGASTRODUODENOSCOPY N/A 02/17/2015   Procedure: ESOPHAGOGASTRODUODENOSCOPY (EGD);  Surgeon: Malissa Hippo, MD;  [facility];  Service: Endoscopy;  Laterality: N/A;  730   ESOPHAGOGASTRODUODENOSCOPY N/A 08/27/2015   Procedure: ESOPHAGOGASTRODUODENOSCOPY (EGD);  Surgeon: Malissa Hippo, MD;  [facility];  Service: Endoscopy;  Laterality: N/A;  1155   ESOPHAGOGASTRODUODENOSCOPY N/A 08/04/2016   Procedure: ESOPHAGOGASTRODUODENOSCOPY (EGD);  Surgeon: Malissa Hippo, MD;  [facility];  Service: Endoscopy;  Laterality: N/A;  225   ESOPHAGOGASTRODUODENOSCOPY N/A 04/13/2017   Procedure: ESOPHAGOGASTRODUODENOSCOPY (EGD);  Surgeon: Malissa Hippo, MD;  [facility];  Service: Endoscopy;  Laterality: N/A;   ESOPHAGOGASTRODUODENOSCOPY (EGD) WITH ESOPHAGEAL DILATION N/A 05/31/2013  Procedure: ESOPHAGOGASTRODUODENOSCOPY (EGD) WITH ESOPHAGEAL DILATION;  Surgeon: Malissa Hippo, MD;  [facility];  Service: Endoscopy;  Laterality: N/A;   ESOPHAGOGASTRODUODENOSCOPY (EGD) WITH PROPOFOL N/A 01/14/2021   Procedure: ESOPHAGOGASTRODUODENOSCOPY (EGD) WITH PROPOFOL;  Surgeon: Malissa Hippo, MD;  [facility];  Service: Endoscopy;  Laterality: N/A;  11:00 Am   ESOPHAGOGASTRODUODENOSCOPY (EGD) WITH PROPOFOL N/A 01/21/2022   Procedure: ESOPHAGOGASTRODUODENOSCOPY (EGD) WITH PROPOFOL;  Surgeon: Malissa Hippo, MD;  [facility];   Service: Endoscopy;  Laterality: N/A;  830   FLEXIBLE SIGMOIDOSCOPY N/A 05/31/2013   Procedure: FLEXIBLE SIGMOIDOSCOPY;  Surgeon: Malissa Hippo, MD;  [facility];  Service: Endoscopy;  Laterality: N/A;  250   FLEXIBLE SIGMOIDOSCOPY N/A 06/07/2014   Procedure: FLEXIBLE SIGMOIDOSCOPY;  Surgeon: Malissa Hippo, MD;  [facility];  Service: Endoscopy;  Laterality: N/A;   GIVENS CAPSULE STUDY  05/03/2011   Procedure: GIVENS CAPSULE STUDY;  Surgeon: Malissa Hippo, MD;  [facility];  Service: Endoscopy;  Laterality: N/A;  7:30 am   STOMACH SURGERY     2015- "ulcer surgery".   UPPER GASTROINTESTINAL ENDOSCOPY      Family History: Family History  Problem Relation Age of Onset   Healthy Mother    Heart attack Father    Healthy Sister    Colon cancer Neg Hx     Social History: Social History   Tobacco Use  Smoking Status Never   Passive exposure: Never  Smokeless Tobacco Never   Social History   Substance and Sexual Activity  Alcohol Use No   Alcohol/week: 0.0 standard drinks of alcohol   Social History   Substance and Sexual Activity  Drug Use No    Allergies: Allergies  Allergen Reactions   Aspirin Other (See Comments)    Ulcer, Reflux   Codeine Nausea And Vomiting    Speeds heart up, dizziness   Fentanyl Nausea And Vomiting    Confusion, weakness   Nsaids Other (See Comments)    reflux, ulcer   Propoxyphene N-Acetaminophen Nausea And Vomiting    Speeds heart up, dizziness    Medications: Current Outpatient Medications  Medication Sig Dispense Refill   acetaminophen (TYLENOL) 500 MG tablet Take 500 mg by mouth every 6 (six) hours as needed for headache.     ALPRAZolam (XANAX) 0.25 MG tablet Take 1 tablet (0.25 mg total) by mouth 3 (three) times daily as needed. 90 tablet 0   Ascorbic Acid (VITAMIN C WITH ROSE HIPS) 1000 MG tablet Take 1,000 mg by mouth daily.     Cholecalciferol (VITAMIN D3) 5000 UNITS CAPS Take 5,000 Units by  mouth daily.      dicyclomine (BENTYL) 10 MG capsule Take 1 capsule (10 mg total) by mouth every 12 (twelve) hours as needed (abdominal pain). 180 capsule 0   diphenoxylate-atropine (LOMOTIL) 2.5-0.025 MG tablet Take 1 tablet by mouth 4 (four) times daily as needed for diarrhea or loose stools. 120 tablet 2   ferrous gluconate (FERGON) 324 MG tablet Take 324 mg by mouth daily at 6 (six) AM.     metoprolol tartrate (LOPRESSOR) 25 MG tablet Take 1 tablet by mouth twice daily 180 tablet 3   Multiple Vitamin (MULTIVITAMIN WITH MINERALS) TABS tablet Take 1 tablet by mouth daily.     ondansetron (ZOFRAN) 4 MG tablet Take 1 tablet (4 mg total) by mouth 2 (two) times daily as needed for nausea or vomiting. 60 tablet 1   pantoprazole (  PROTONIX) 40 MG tablet TAKE (1) TABLET DAILY BEFORE BREAKFAST. 90 tablet 0   promethazine (PHENADOZ) 25 MG suppository Place 1 suppository (25 mg total) rectally 2 (two) times daily as needed for nausea or vomiting. 12 each 1   promethazine (PHENERGAN) 25 MG tablet Take 1 tablet (25 mg total) by mouth 2 (two) times daily as needed for nausea or vomiting. 20 tablet 0   rosuvastatin (CRESTOR) 5 MG tablet Take 5 mg by mouth daily.     Simethicone (PHAZYME) 180 MG CAPS Take 1 capsule (180 mg total) by mouth 2 (two) times daily as needed. (Patient taking differently: Take 180 mg by mouth 2 (two) times daily as needed (bloating).)  0   sucralfate (CARAFATE) 1 g tablet Take 1 tablet (1 g total) by mouth 4 (four) times daily -  with meals and at bedtime. As needed 180 tablet 1   vitamin B-12 (CYANOCOBALAMIN) 1000 MCG tablet Take 1,000 mcg by mouth daily.     zinc gluconate 50 MG tablet Take 50 mg by mouth daily.     No current facility-administered medications for this visit.    Review of Systems: GENERAL: negative for malaise, night sweats HEENT: No changes in hearing or vision, no nose bleeds or other nasal problems. NECK: Negative for lumps, goiter, pain and significant neck  swelling RESPIRATORY: Negative for cough, wheezing CARDIOVASCULAR: Negative for chest pain, leg swelling, palpitations, orthopnea GI: SEE HPI MUSCULOSKELETAL: Negative for joint pain or swelling, back pain, and muscle pain. SKIN: Negative for lesions, rash PSYCH: Negative for sleep disturbance, mood disorder and recent psychosocial stressors. HEMATOLOGY Negative for prolonged bleeding, bruising easily, and swollen nodes. ENDOCRINE: Negative for cold or heat intolerance, polyuria, polydipsia and goiter. NEURO: negative for tremor, gait imbalance, syncope and seizures. The remainder of the review of systems is noncontributory.   Physical Exam: BP 117/75 (BP Location: Left Arm, Patient Position: Sitting, Cuff Size: Normal)   Pulse 72   Temp (!) 97.3 F (36.3 C) (Temporal)   Ht 5\' 4"  (1.626 m)   Wt 108 lb 4.8 oz (49.1 kg)   BMI 18.59 kg/m  GENERAL: The patient is AO x3, in no acute distress. HEENT: Head is normocephalic and atraumatic. EOMI are intact. Mouth is well hydrated and without lesions. NECK: Supple. No masses LUNGS: Clear to auscultation. No presence of rhonchi/wheezing/rales. Adequate chest expansion HEART: RRR, normal s1 and s2. ABDOMEN: Soft, nontender, no guarding, no peritoneal signs, and nondistended. BS +. No masses. EXTREMITIES: Without any cyanosis, clubbing, rash, lesions or edema. NEUROLOGIC: AOx3, no focal motor deficit. SKIN: no jaundice, no rashes  Imaging/Labs: as above  I personally reviewed and interpreted the available labs, imaging and endoscopic files.  Impression and Plan: Lindsay Lawrence is a 67 y.o. female with IBS-D, gastrectomy induced gastroparesis,GERD, anxiety, who presents for follow up of IBS-D , dysphagia and gastroparesis.  Patient has presented relatively stable symptoms of multiple gastrointestinal complaints, although he has recently slightly worsened.   She had evidence of esophageal dysmotility in her previous x-ray.  We discussed that  this could be related to her surgical resection as well as this may help lead to injury of her vagal nerve but we may investigate this further which with repeating an esophagram or performing esophageal manometry.  She does not want to proceed with this unless it is really necessary.  She will continue with her current medication regimen for now and try to chew food thoroughly.  For now, she can keep taking Bentyl  as needed for abdominal pain and antiemetics as needed.  Also will need to continue taking pantoprazole 40 mg every day for her GERD.  Finally, patient had evidence of vitamin D sufficiency and low ferritin levels.  Will obtain repeat vitamin D level and iron stores today, as well as repeat CBC and CMP.  -Continue promethazine as needed for nausea -Continue pantoprazole 40 mg qday -Continue Zofran as needed for nausea -Continue Lomotil as needed for diarrhea episodes (if more than 4 bowel movements per day) -Continue Carafate as needed  -Continue Bentyl 1 tablet q12h as needed for abdominal pain -Patient to call back if interested In having further testing (x-ray, esophageal manometry) for dysphagia or if wanting to start medications for this -Check CBC, CMP, vitamin D and iron stores  All questions were answered.      Lindsay Blazing, MD Gastroenterology and Hepatology [facility] Gastroenterology

## 2023-01-17 NOTE — Telephone Encounter (Signed)
noted 

## 2023-01-17 NOTE — Patient Instructions (Addendum)
Continue promethazine as needed for nausea Continue pantoprazole 40 mg qday Continue Zofran as needed for nausea Continue Lomotil as needed for diarrhea episodes (if more than 4 bowel movements per day) Continue Carafate as needed  Continue Bentyl 1 tablet q12h as needed for abdominal pain Call back if interested In having further testing (x-ray, esophageal manometry) for issues swallowing or if wanting to start medications for slow stomach Perform blood workup

## 2023-01-18 LAB — COMPREHENSIVE METABOLIC PANEL
AG Ratio: 1.8 (calc) (ref 1.0–2.5)
ALT: 21 U/L (ref 6–29)
AST: 24 U/L (ref 10–35)
Albumin: 4.3 g/dL (ref 3.6–5.1)
Alkaline phosphatase (APISO): 75 U/L (ref 37–153)
BUN: 12 mg/dL (ref 7–25)
CO2: 29 mmol/L (ref 20–32)
Calcium: 9.4 mg/dL (ref 8.6–10.4)
Chloride: 103 mmol/L (ref 98–110)
Creat: 0.79 mg/dL (ref 0.50–1.05)
Globulin: 2.4 g/dL (calc) (ref 1.9–3.7)
Glucose, Bld: 92 mg/dL (ref 65–99)
Potassium: 4.6 mmol/L (ref 3.5–5.3)
Sodium: 140 mmol/L (ref 135–146)
Total Bilirubin: 0.7 mg/dL (ref 0.2–1.2)
Total Protein: 6.7 g/dL (ref 6.1–8.1)

## 2023-01-18 LAB — CBC WITH DIFFERENTIAL/PLATELET
Absolute Monocytes: 320 cells/uL (ref 200–950)
Basophils Absolute: 70 cells/uL (ref 0–200)
Basophils Relative: 1.4 %
Eosinophils Absolute: 140 cells/uL (ref 15–500)
Eosinophils Relative: 2.8 %
HCT: 38.4 % (ref 35.0–45.0)
Hemoglobin: 12.4 g/dL (ref 11.7–15.5)
Lymphs Abs: 1505 cells/uL (ref 850–3900)
MCH: 28.7 pg (ref 27.0–33.0)
MCHC: 32.3 g/dL (ref 32.0–36.0)
MCV: 88.9 fL (ref 80.0–100.0)
MPV: 11.4 fL (ref 7.5–12.5)
Monocytes Relative: 6.4 %
Neutro Abs: 2965 cells/uL (ref 1500–7800)
Neutrophils Relative %: 59.3 %
Platelets: 202 10*3/uL (ref 140–400)
RBC: 4.32 10*6/uL (ref 3.80–5.10)
RDW: 11.8 % (ref 11.0–15.0)
Total Lymphocyte: 30.1 %
WBC: 5 10*3/uL (ref 3.8–10.8)

## 2023-01-18 LAB — IRON,TIBC AND FERRITIN PANEL
%SAT: 26 % (calc) (ref 16–45)
Ferritin: 10 ng/mL — ABNORMAL LOW (ref 16–288)
Iron: 120 ug/dL (ref 45–160)
TIBC: 459 mcg/dL (calc) — ABNORMAL HIGH (ref 250–450)

## 2023-01-18 LAB — VITAMIN D 25 HYDROXY (VIT D DEFICIENCY, FRACTURES): Vit D, 25-Hydroxy: 20 ng/mL — ABNORMAL LOW (ref 30–100)

## 2023-01-19 ENCOUNTER — Encounter (INDEPENDENT_AMBULATORY_CARE_PROVIDER_SITE_OTHER): Payer: Self-pay

## 2023-01-19 ENCOUNTER — Other Ambulatory Visit: Payer: Self-pay | Admitting: Gastroenterology

## 2023-01-21 ENCOUNTER — Telehealth (INDEPENDENT_AMBULATORY_CARE_PROVIDER_SITE_OTHER): Payer: Self-pay

## 2023-01-21 NOTE — Telephone Encounter (Signed)
Thanks. Hi Tanya,   Can you please schedule a colonoscopy? Dx: iron deficiency. Room: any  Thanks,  [name], MD Gastroenterology and Hepatology [facility] Gastroenterology

## 2023-01-21 NOTE — Telephone Encounter (Signed)
Patient came by today and says she is wanting to proceed with TCS.

## 2023-01-25 MED ORDER — PEG 3350-KCL-NA BICARB-NACL 420 G PO SOLR: 4000.0000 mL | Freq: Once | ORAL | 0 refills | Status: AC

## 2023-01-25 NOTE — Telephone Encounter (Signed)
Left message to return call 

## 2023-01-25 NOTE — Addendum Note (Signed)
Addended by: Marlowe Shores on: 01/25/2023 08:50 AM   Modules accepted: Orders

## 2023-01-25 NOTE — Telephone Encounter (Signed)
Pt returned call and scheduled TCS for 02/24/23 at 11am. Prep sent to pharmacy and instructions mailed to patient. Informed pt that if she has received instructions one week before procedure to call and let me know so she can come by and pick them up

## 2023-02-24 ENCOUNTER — Encounter (HOSPITAL_COMMUNITY): Payer: Self-pay | Admitting: Gastroenterology

## 2023-02-24 ENCOUNTER — Ambulatory Visit (HOSPITAL_COMMUNITY): Payer: BC Managed Care – PPO | Admitting: Anesthesiology

## 2023-02-24 ENCOUNTER — Other Ambulatory Visit: Payer: Self-pay

## 2023-02-24 ENCOUNTER — Encounter (HOSPITAL_COMMUNITY)
Admission: RE | Disposition: A | Discharge status: Home / Self Care | Payer: Self-pay | Source: Home / Self Care | Attending: Gastroenterology

## 2023-02-24 ENCOUNTER — Encounter (INDEPENDENT_AMBULATORY_CARE_PROVIDER_SITE_OTHER): Payer: Self-pay | Admitting: *Deleted

## 2023-02-24 ENCOUNTER — Ambulatory Visit (HOSPITAL_COMMUNITY)
Admission: RE | Admit: 2023-02-24 | Discharge: 2023-02-24 | Disposition: A | Discharge status: Home / Self Care | Payer: BC Managed Care – PPO | Attending: Gastroenterology | Admitting: Gastroenterology

## 2023-02-24 DIAGNOSIS — K5730 Diverticulosis of large intestine without perforation or abscess without bleeding: Secondary | ICD-10-CM | POA: Diagnosis not present

## 2023-02-24 DIAGNOSIS — K580 Irritable bowel syndrome with diarrhea: Secondary | ICD-10-CM | POA: Insufficient documentation

## 2023-02-24 DIAGNOSIS — E611 Iron deficiency: Secondary | ICD-10-CM | POA: Diagnosis not present

## 2023-02-24 DIAGNOSIS — Z8639 Personal history of other endocrine, nutritional and metabolic disease: Secondary | ICD-10-CM | POA: Diagnosis not present

## 2023-02-24 DIAGNOSIS — F419 Anxiety disorder, unspecified: Secondary | ICD-10-CM | POA: Diagnosis not present

## 2023-02-24 DIAGNOSIS — K219 Gastro-esophageal reflux disease without esophagitis: Secondary | ICD-10-CM | POA: Insufficient documentation

## 2023-02-24 DIAGNOSIS — K3184 Gastroparesis: Secondary | ICD-10-CM | POA: Insufficient documentation

## 2023-02-24 HISTORY — PX: COLONOSCOPY WITH PROPOFOL: SHX5780

## 2023-02-24 LAB — HM COLONOSCOPY

## 2023-02-24 SURGERY — COLONOSCOPY WITH PROPOFOL
Anesthesia: General

## 2023-02-24 MED ORDER — PROPOFOL 500 MG/50ML IV EMUL
INTRAVENOUS | Status: DC | PRN
  Administered 2023-02-24: 150 ug/kg/min via INTRAVENOUS

## 2023-02-24 MED ORDER — LACTATED RINGERS IV SOLN: INTRAVENOUS | Status: DC | PRN

## 2023-02-24 MED ORDER — PROPOFOL 10 MG/ML IV BOLUS
INTRAVENOUS | Status: DC | PRN
  Administered 2023-02-24: 20 mg via INTRAVENOUS
  Administered 2023-02-24: 50 mg via INTRAVENOUS

## 2023-02-24 MED ORDER — LACTATED RINGERS IV SOLN: INTRAVENOUS | Status: DC

## 2023-02-24 NOTE — Discharge Instructions (Signed)
You are being discharged to home.  Resume your previous diet.  Your physician has recommended a repeat colonoscopy in 10 years for screening purposes.  Continue oral iron supplementation.

## 2023-02-24 NOTE — Op Note (Signed)
[facility] Patient Name: Lindsay Lawrence Procedure Date: 02/24/2023 10:37 AM MRN: [id number] Date of Birth: 08/17/1956 Attending MD: [name] , , [id number] CSN: [id number] Age: 67 Admit Type: Outpatient Procedure:                Colonoscopy Indications:              Unexplained iron deficiency Providers:                [name], Buel Ream. Thomasena Edis RN, RN, Dyann Ruddle Referring MD:              Medicines:                Monitored Anesthesia Care Complications:            No immediate complications. Estimated Blood Loss:     Estimated blood loss: none. Procedure:                Pre-Anesthesia Assessment:                           - Prior to the procedure, a History and Physical                            was performed, and patient medications, allergies                            and sensitivities were reviewed. The patient's                            tolerance of previous anesthesia was reviewed.                           - The risks and benefits of the procedure and the                            sedation options and risks were discussed with the                            patient. All questions were answered and informed                            consent was obtained.                           - ASA Grade Assessment: II - A patient with mild                            systemic disease.                           After obtaining informed consent, the colonoscope                            was passed under direct vision. Throughout the  procedure, the patient's blood pressure, pulse, and                            oxygen saturations were monitored continuously. The                            [id number]-HQ190L ([id number]) scope was introduced through                            the anus and advanced to the the cecum, identified                            by appendiceal orifice and ileocecal valve. The                             colonoscopy was performed without difficulty. The                            patient tolerated the procedure well. The quality                            of the bowel preparation was good. Scope In: 11:01:43 AM Scope Out: 11:23:52 AM Scope Withdrawal Time: 0 hours 13 minutes 16 seconds  Total Procedure Duration: 0 hours 22 minutes 9 seconds  Findings:      The perianal and digital rectal examinations were normal.      Scattered small-mouthed diverticula were found in the sigmoid colon and       descending colon.      The retroflexed view of the distal rectum and anal verge was normal and       showed no anal or rectal abnormalities. Impression:               - Diverticulosis in the sigmoid colon and in the                            descending colon.                           - The distal rectum and anal verge are normal on                            retroflexion view.                           - No specimens collected. Moderate Sedation:      Per Anesthesia Care Recommendation:           - Discharge patient to home (ambulatory).                           - Resume previous diet.                           - Repeat colonoscopy in 10 years for screening  purposes.                           - Continue oral iron supplementation. Procedure Code(s):        --- Professional ---                           [phone], Colonoscopy, flexible; diagnostic, including                            collection of specimen(s) by brushing or washing,                            when performed (separate procedure) Diagnosis Code(s):        --- Professional ---                           E61.1, Iron deficiency                           K57.30, Diverticulosis of large intestine without                            perforation or abscess without bleeding CPT copyright 2022 American Medical Association. All rights reserved. The codes documented in this report are preliminary and upon  coder review may  be revised to meet current compliance requirements. [name], MD [name],  02/24/2023 11:33:29 AM This report has been signed electronically. Number of Addenda: 0

## 2023-02-24 NOTE — Anesthesia Postprocedure Evaluation (Signed)
Anesthesia Post Note  Patient: Lindsay Lawrence  Procedure(s) Performed: COLONOSCOPY WITH PROPOFOL  Patient location during evaluation: Phase II Anesthesia Type: General Level of consciousness: awake and alert and oriented Pain management: pain level controlled Vital Signs Assessment: post-procedure vital signs reviewed and stable Respiratory status: spontaneous breathing, nonlabored ventilation and respiratory function stable Cardiovascular status: blood pressure returned to baseline and stable Postop Assessment: no apparent nausea or vomiting Anesthetic complications: no  No notable events documented.   Last Vitals:  Vitals:   02/24/23 1129 02/24/23 1132  BP: (!) 92/59 (!) 105/58  Pulse: 75   Resp: 20   Temp: 36.8 C   SpO2: 100%     Last Pain:  Vitals:   02/24/23 1129  TempSrc: Oral  PainSc: 0-No pain                 [name]

## 2023-02-24 NOTE — Anesthesia Preprocedure Evaluation (Signed)
Anesthesia Evaluation  Patient identified by MRN, date of birth, ID band Patient awake    Reviewed: Allergy & Precautions, H&P , NPO status , Patient's Chart, lab work & pertinent test results, reviewed documented beta blocker date and time   Airway Mallampati: II  TM Distance: >3 FB Neck ROM: full    Dental no notable dental hx.    Pulmonary shortness of breath   Pulmonary exam normal breath sounds clear to auscultation       Cardiovascular Exercise Tolerance: Good negative cardio ROS  Rhythm:regular Rate:Normal     Neuro/Psych  PSYCHIATRIC DISORDERS Anxiety     negative neurological ROS     GI/Hepatic Neg liver ROS, PUD,GERD  Medicated,,  Endo/Other  negative endocrine ROS    Renal/GU negative Renal ROS  negative genitourinary   Musculoskeletal   Abdominal   Peds  Hematology  (+) Blood dyscrasia, anemia   Anesthesia Other Findings   Reproductive/Obstetrics negative OB ROS                              Anesthesia Physical Anesthesia Plan  ASA: 2  Anesthesia Plan: General   Post-op Pain Management:    Induction:   PONV Risk Score and Plan: Propofol infusion  Airway Management Planned: Natural Airway and Nasal Cannula  Additional Equipment:   Intra-op Plan:   Post-operative Plan:   Informed Consent: I have reviewed the patients History and Physical, chart, labs and discussed the procedure including the risks, benefits and alternatives for the proposed anesthesia with the patient or authorized representative who has indicated his/her understanding and acceptance.     Dental Advisory Given  Plan Discussed with: CRNA  Anesthesia Plan Comments:          Anesthesia Quick Evaluation

## 2023-02-24 NOTE — Transfer of Care (Signed)
Immediate Anesthesia Transfer of Care Note  Patient: Lindsay Lawrence  Procedure(s) Performed: COLONOSCOPY WITH PROPOFOL  Patient Location: PACU  Anesthesia Type:General  Level of Consciousness: awake, alert , oriented, and patient cooperative  Airway & Oxygen Therapy: Patient Spontanous Breathing  Post-op Assessment: Report given to RN, Post -op Vital signs reviewed and stable, and Patient moving all extremities X 4  Post vital signs: Reviewed and stable  Last Vitals:  Vitals Value Taken Time  BP 92/59 02/24/23 1129  Temp 36.8 C 02/24/23 1129  Pulse 75 02/24/23 1129  Resp 20 02/24/23 1129  SpO2 100 % 02/24/23 1129    Last Pain:  Vitals:   02/24/23 1129  TempSrc: Oral  PainSc: 0-No pain         Complications: No notable events documented.

## 2023-02-24 NOTE — H&P (Signed)
Lindsay Lawrence is an 67 y.o. female.   Chief Complaint: low iron stores HPI: Lindsay Lawrence is a 67 y.o. female with IBS-D, gastrectomy induced gastroparesis,GERD, anxiety, who presents for evaluation of low iron stores.  The patient denies having any vomiting, fever, chills, hematochezia, melena, hematemesis, abdominal distention, abdominal pain, diarrhea, jaundice, pruritus or weight loss.  Past Medical History:  Diagnosis Date   Anemia    Anxiety    Chest pain    Nuclear, normal, 2010,  //   hospital August, 2013 no evidence of injury   Ejection fraction    EF 65%, echo, April 19, 2012   Gastric ulcer    GERD (gastroesophageal reflux disease)    History of palpitations    Irritable bowel syndrome    Mitral regurgitation    Mild, echo, August, 2013   Palpitations    Reactive airway disease    Pulmonary function studies from June, 2013, show the patient does have significant response to bronchodilators.   Syncope    Vasovagal    Past Surgical History:  Procedure Laterality Date   ABDOMINAL HYSTERECTOMY     BIOPSY  08/04/2016   Procedure: BIOPSY;  Surgeon: Malissa Hippo, MD;  Location: [facility];  Service: Endoscopy;;  gastric   BIOPSY  01/14/2021   Procedure: BIOPSY;  Surgeon: Malissa Hippo, MD;  [facility]: [facility];  Service: Endoscopy;;  gastric esophageal   BIOPSY  01/21/2022   Procedure: BIOPSY;  Surgeon: Malissa Hippo, MD;  Location: [facility];  Service: Endoscopy;;   CATARACT EXTRACTION W/PHACO Left 09/11/2021   Procedure: CATARACT EXTRACTION PHACO AND INTRAOCULAR LENS PLACEMENT (IOC);  Surgeon: Fabio Pierce, MD;  Location: [facility];  Service: Ophthalmology;  Laterality: Left;  CDE: 5.25   CATARACT EXTRACTION W/PHACO Right 10/26/2021   Procedure: CATARACT EXTRACTION PHACO AND INTRAOCULAR LENS PLACEMENT (IOC);  Surgeon: Fabio Pierce, MD;  Location: [facility];  Service: Ophthalmology;  Laterality: Right;  CDE: 5.08   CHOLECYSTECTOMY     COLONOSCOPY      COLONOSCOPY WITH PROPOFOL N/A 03/04/2017   Procedure: COLONOSCOPY WITH PROPOFOL;  Surgeon: Malissa Hippo, MD;  Location: [facility];  Service: Endoscopy;  Laterality: N/A;  10:10   ESOPHAGEAL DILATION  06/07/2014   Procedure: ESOPHAGEAL DILATION;  Surgeon: Malissa Hippo, MD;  [facility]: [facility];  Service: Endoscopy;;   ESOPHAGEAL DILATION N/A 08/27/2015   Procedure: ESOPHAGEAL DILATION;  Surgeon: Malissa Hippo, MD;  [facility]: [facility];  Service: Endoscopy;  Laterality: N/A;   ESOPHAGEAL DILATION  08/04/2016   Procedure: ESOPHAGEAL DILATION;  Surgeon: Malissa Hippo, MD;  [facility]: [facility];  Service: Endoscopy;;   ESOPHAGEAL DILATION N/A 04/13/2017   Procedure: ESOPHAGEAL DILATION;  Surgeon: Malissa Hippo, MD;  [facility]: [facility];  Service: Endoscopy;  Laterality: N/A;   ESOPHAGEAL DILATION N/A 01/21/2022   Procedure: ESOPHAGEAL DILATION;  Surgeon: Malissa Hippo, MD;  [facility]: [facility];  Service: Endoscopy;  Laterality: N/A;   ESOPHAGOGASTRODUODENOSCOPY  02/04/2012   Procedure: ESOPHAGOGASTRODUODENOSCOPY (EGD);  Surgeon: Malissa Hippo, MD;  [facility]: [facility];  Service: Endoscopy;  Laterality: N/A;  320   ESOPHAGOGASTRODUODENOSCOPY  06/02/2012   Procedure: ESOPHAGOGASTRODUODENOSCOPY (EGD);  Surgeon: Malissa Hippo, MD;  [facility]: [facility];  Service: Endoscopy;  Laterality: N/A;  1050   ESOPHAGOGASTRODUODENOSCOPY N/A 06/07/2014   Procedure: ESOPHAGOGASTRODUODENOSCOPY (EGD);  Surgeon: Malissa Hippo, MD;  [facility]: [facility];  Service: Endoscopy;  Laterality: N/A;  1035-rescheduled 9/25 @ 8:30 Ann to notify pt   ESOPHAGOGASTRODUODENOSCOPY N/A 02/17/2015   Procedure: ESOPHAGOGASTRODUODENOSCOPY (EGD);  Surgeon: Malissa Hippo, MD;  [facility]: [facility];  Service: Endoscopy;  Laterality: N/A;  730   ESOPHAGOGASTRODUODENOSCOPY N/A 08/27/2015   Procedure: ESOPHAGOGASTRODUODENOSCOPY (EGD);  Surgeon: Malissa Hippo, MD;  [facility]: [facility];   Service: Endoscopy;  Laterality: N/A;  1155   ESOPHAGOGASTRODUODENOSCOPY N/A 08/04/2016   Procedure: ESOPHAGOGASTRODUODENOSCOPY (EGD);  Surgeon: Malissa Hippo, MD;  [facility]: [facility];  Service: Endoscopy;  Laterality: N/A;  225   ESOPHAGOGASTRODUODENOSCOPY N/A 04/13/2017   Procedure: ESOPHAGOGASTRODUODENOSCOPY (EGD);  Surgeon: Malissa Hippo, MD;  [facility]: [facility];  Service: Endoscopy;  Laterality: N/A;   ESOPHAGOGASTRODUODENOSCOPY (EGD) WITH ESOPHAGEAL DILATION N/A 05/31/2013   Procedure: ESOPHAGOGASTRODUODENOSCOPY (EGD) WITH ESOPHAGEAL DILATION;  Surgeon: Malissa Hippo, MD;  [facility]: [facility];  Service: Endoscopy;  Laterality: N/A;   ESOPHAGOGASTRODUODENOSCOPY (EGD) WITH PROPOFOL N/A 01/14/2021   Procedure: ESOPHAGOGASTRODUODENOSCOPY (EGD) WITH PROPOFOL;  Surgeon: Malissa Hippo, MD;  [facility]: [facility];  Service: Endoscopy;  Laterality: N/A;  11:00 Am   ESOPHAGOGASTRODUODENOSCOPY (EGD) WITH PROPOFOL N/A 01/21/2022   Procedure: ESOPHAGOGASTRODUODENOSCOPY (EGD) WITH PROPOFOL;  Surgeon: Malissa Hippo, MD;  [facility]: [facility];  Service: Endoscopy;  Laterality: N/A;  830   FLEXIBLE SIGMOIDOSCOPY N/A 05/31/2013   Procedure: FLEXIBLE SIGMOIDOSCOPY;  Surgeon: Malissa Hippo, MD;  [facility]: [facility];  Service: Endoscopy;  Laterality: N/A;  250   FLEXIBLE SIGMOIDOSCOPY N/A 06/07/2014   Procedure: FLEXIBLE SIGMOIDOSCOPY;  Surgeon: Malissa Hippo, MD;  [facility]: [facility];  Service: Endoscopy;  Laterality: N/A;   GIVENS CAPSULE STUDY  05/03/2011   Procedure: GIVENS CAPSULE STUDY;  Surgeon: Malissa Hippo, MD;  Location: [facility];  Service: Endoscopy;  Laterality: N/A;  7:30 am   STOMACH SURGERY     2015- "ulcer surgery".   UPPER GASTROINTESTINAL ENDOSCOPY      Family History  Problem Relation Age of Onset   Healthy Mother    Heart attack Father    Healthy Sister    Colon cancer Neg Hx    Social History:  reports that she has never smoked. She has  never been exposed to tobacco smoke. She has never used smokeless tobacco. She reports that she does not drink alcohol and does not use drugs.  Allergies:  Allergies  Allergen Reactions   Aspirin Other (See Comments)    Ulcer, Reflux   Codeine Nausea And Vomiting    Speeds heart up, dizziness   Fentanyl Nausea And Vomiting    Confusion, weakness   Nsaids Other (See Comments)    reflux, ulcer   Propoxyphene N-Acetaminophen Nausea And Vomiting    Speeds heart up, dizziness    Medications Prior to Admission  Medication Sig Dispense Refill   acetaminophen (TYLENOL) 500 MG tablet Take 500 mg by mouth every 6 (six) hours as needed for headache.     ALPRAZolam (XANAX) 0.25 MG tablet Take 1 tablet (0.25 mg total) by mouth 3 (three) times daily as needed. 90 tablet 0   Ascorbic Acid (VITAMIN C WITH ROSE HIPS) 1000 MG tablet Take 1,000 mg by mouth daily.     Cholecalciferol (VITAMIN D3) 5000 UNITS CAPS Take 5,000 Units by mouth daily.      dicyclomine (BENTYL) 10 MG capsule Take 1 capsule (10 mg total) by mouth every 12 (twelve) hours as needed (abdominal pain). 180 capsule 0  diphenoxylate-atropine (LOMOTIL) 2.5-0.025 MG tablet Take 1 tablet by mouth 4 (four) times daily as needed for diarrhea or loose stools. 120 tablet 2   ferrous gluconate (FERGON) 324 MG tablet Take 324 mg by mouth 2 (two) times daily with a meal.     metoprolol tartrate (LOPRESSOR) 25 MG tablet Take 1 tablet by mouth twice daily 180 tablet 3   Multiple Vitamin (MULTIVITAMIN WITH MINERALS) TABS tablet Take 1 tablet by mouth daily.     ondansetron (ZOFRAN) 4 MG tablet Take 1 tablet (4 mg total) by mouth 2 (two) times daily as needed for nausea or vomiting. 60 tablet 1   pantoprazole (PROTONIX) 40 MG tablet TAKE (1) TABLET DAILY BEFORE BREAKFAST. 90 tablet 0   promethazine (PHENADOZ) 25 MG suppository Place 1 suppository (25 mg total) rectally 2 (two) times daily as needed for nausea or vomiting. 12 each 1   promethazine  (PHENERGAN) 25 MG tablet Take 1 tablet (25 mg total) by mouth 2 (two) times daily as needed for nausea or vomiting. 20 tablet 0   rosuvastatin (CRESTOR) 5 MG tablet Take 5 mg by mouth daily.     Simethicone (PHAZYME) 180 MG CAPS Take 1 capsule (180 mg total) by mouth 2 (two) times daily as needed. (Patient taking differently: Take 180 mg by mouth 2 (two) times daily as needed (bloating).)  0   sucralfate (CARAFATE) 1 g tablet Take 1 tablet (1 g total) by mouth 4 (four) times daily -  with meals and at bedtime. As needed (Patient taking differently: Take 1 g by mouth 4 (four) times daily as needed. As needed) 180 tablet 1   vitamin B-12 (CYANOCOBALAMIN) 1000 MCG tablet Take 1,000 mcg by mouth daily.     zinc gluconate 50 MG tablet Take 50 mg by mouth daily.      No results found for this or any previous visit (from the past 48 hour(s)). No results found.  Review of Systems  Gastrointestinal:  Positive for nausea.  All other systems reviewed and are negative.   Blood pressure 113/77, pulse 63, temperature 98 F (36.7 C), temperature source Oral, resp. rate 10, height 5\' 4"  (1.626 m), weight 49.9 kg, SpO2 100 %. Physical Exam  GENERAL: The patient is AO x3, in no acute distress. HEENT: Head is normocephalic and atraumatic. EOMI are intact. Mouth is well hydrated and without lesions. NECK: Supple. No masses LUNGS: Clear to auscultation. No presence of rhonchi/wheezing/rales. Adequate chest expansion HEART: RRR, normal s1 and s2. ABDOMEN: Soft, nontender, no guarding, no peritoneal signs, and nondistended. BS +. No masses. EXTREMITIES: Without any cyanosis, clubbing, rash, lesions or edema. NEUROLOGIC: AOx3, no focal motor deficit. SKIN: no jaundice, no rashes\ Assessment/Plan Lindsay Lawrence is a 67 y.o. female with IBS-D, gastrectomy induced gastroparesis,GERD, anxiety, who presents for evaluation of low iron stores.  Will proceed with colonoscopy.  Dolores Frame, MD 02/24/2023,  10:43 AM

## 2023-03-02 ENCOUNTER — Encounter (HOSPITAL_COMMUNITY): Payer: Self-pay | Admitting: Gastroenterology

## 2023-03-15 ENCOUNTER — Encounter (INDEPENDENT_AMBULATORY_CARE_PROVIDER_SITE_OTHER): Payer: Self-pay

## 2023-03-15 ENCOUNTER — Telehealth (INDEPENDENT_AMBULATORY_CARE_PROVIDER_SITE_OTHER): Payer: Self-pay | Admitting: *Deleted

## 2023-03-15 NOTE — Telephone Encounter (Signed)
Pt called concerned about the way colonoscopy was coded. Reba sent mychart message

## 2023-05-06 ENCOUNTER — Other Ambulatory Visit: Payer: Self-pay | Admitting: Cardiology

## 2023-07-18 ENCOUNTER — Ambulatory Visit (INDEPENDENT_AMBULATORY_CARE_PROVIDER_SITE_OTHER): Payer: BC Managed Care – PPO | Admitting: Gastroenterology

## 2023-07-21 ENCOUNTER — Encounter (INDEPENDENT_AMBULATORY_CARE_PROVIDER_SITE_OTHER): Payer: Self-pay | Admitting: Gastroenterology

## 2023-07-21 ENCOUNTER — Ambulatory Visit (INDEPENDENT_AMBULATORY_CARE_PROVIDER_SITE_OTHER): Payer: BC Managed Care – PPO | Admitting: Gastroenterology

## 2023-07-21 ENCOUNTER — Telehealth (INDEPENDENT_AMBULATORY_CARE_PROVIDER_SITE_OTHER): Payer: Self-pay | Admitting: *Deleted

## 2023-07-21 VITALS — BP 110/77 | HR 77 | Temp 97.5°F | Ht 64.0 in | Wt 114.8 lb

## 2023-07-21 DIAGNOSIS — K3184 Gastroparesis: Secondary | ICD-10-CM

## 2023-07-21 DIAGNOSIS — G8929 Other chronic pain: Secondary | ICD-10-CM

## 2023-07-21 DIAGNOSIS — R109 Unspecified abdominal pain: Secondary | ICD-10-CM | POA: Diagnosis not present

## 2023-07-21 DIAGNOSIS — E611 Iron deficiency: Secondary | ICD-10-CM | POA: Insufficient documentation

## 2023-07-21 DIAGNOSIS — K580 Irritable bowel syndrome with diarrhea: Secondary | ICD-10-CM

## 2023-07-21 DIAGNOSIS — K219 Gastro-esophageal reflux disease without esophagitis: Secondary | ICD-10-CM

## 2023-07-21 DIAGNOSIS — R1310 Dysphagia, unspecified: Secondary | ICD-10-CM

## 2023-07-21 DIAGNOSIS — R1010 Upper abdominal pain, unspecified: Secondary | ICD-10-CM

## 2023-07-21 NOTE — Progress Notes (Signed)
Lindsay Lawrence, M.D. Gastroenterology & Hepatology [facility] Gastroenterology [address] [place], [place] [address]  Primary Care Physician: Ignatius Specking, MD [address] [place] [address]  I will communicate my assessment and recommendations to the referring MD via EMR.  Problems: Gastroparesis due to partial gastrectomy for large gastric ulcers IBS-D Chronic dysphagia   History of Present Illness: Lindsay Lawrence is a 67 y.o. female with IBS-D, gastrectomy induced gastroparesis,GERD, anxiety, who presents for follow up of IBS-D , dysphagia and gastroparesis.   The patient was last seen on 01/17/2023. At that time, the patient was continued with as needed Phenergan and Zofran.  Also advised to take Lomotil as needed for diarrhea Bentyl as needed for abdominal pain.  Pantoprazole 40 mg every day was continued for GERD.  Blood workup showed normal CBC and CMP.  Vitamin D levels were low at 20 and was advised to increase her vitamin D intake and follow-up with her PCP.  As she had low iron stores, a colonoscopy was scheduled with findings described below.  Most recent labs from [facility] on 07/12/2023 showed CBC with WBC 5.9, hemoglobin 12.2, platelets 202, CMP showing normal electrolytes, creatinine 0.74, BUN 9, normal hepatic panel, normal TSH 1.03.   Patient reports that for the last 2-3 months she has presented worsening episodes of abdominal pain in the upper abdomen. She reports that the pain lasts for 15-20 minutes, and may be present every 2-3 weeks. She feels constant soreness in her upper abdomen after she has these episodes. She reports having nausea but only vomited once in the past, which happened after she had a banana. She states she has had abdominal pain in her upper abdomen for at least 10 years. Patient states she has had Bentyl, which improves her abdominal pain.  The patient denies having any nausea, vomiting, fever, chills, hematochezia,  melena, hematemesis, abdominal distention, diarrhea, jaundice, pruritus . Has gained 6 lb since the last time seen in the office.  Currently taking oral iron twice a day.  Last EGD: 01/21/2022 - Normal hypopharynx. - Normal esophagus. - Z-line irregular, 40 cm from the incisors. - No endoscopic esophageal abnormality to explain patient's dysphagia. Esophagus dilated. Dilated. - Patent Billroth I gastroduodenostomy was found, characterized by edema, erythema and friable mucosa. Biopsied. - Normal second portion of the duodenum, third portion of the duodenum and fourth portion of the duodenum. Comment: ? Bile induced gastritis   Path: FINAL MICROSCOPIC DIAGNOSIS:   A. STOMACH, BODY, BIOPSY:  - Gastric oxyntic mucosa with prominent nonspecific reactive gastropathy  - Helicobacter pylori-like organisms are not identified on routine HE  stain    Last Colonoscopy:2024 Diverticulosis.  Past Medical History: Past Medical History:  Diagnosis Date   Anemia    Anxiety    Chest pain    Nuclear, normal, 2010,  //   hospital August, 2013 no evidence of injury   Ejection fraction    EF 65%, echo, April 19, 2012   Gastric ulcer    GERD (gastroesophageal reflux disease)    History of palpitations    Irritable bowel syndrome    Mitral regurgitation    Mild, echo, August, 2013   Palpitations    Reactive airway disease    Pulmonary function studies from June, 2013, show the patient does have significant response to bronchodilators.   Syncope    Vasovagal    Past Surgical History: Past Surgical History:  Procedure Laterality Date   ABDOMINAL HYSTERECTOMY  BIOPSY  08/04/2016   Procedure: BIOPSY;  Surgeon: Malissa Hippo, MD;  [facility];  Service: Endoscopy;;  gastric   BIOPSY  01/14/2021   Procedure: BIOPSY;  Surgeon: Malissa Hippo, MD;  [facility];  Service: Endoscopy;;  gastric esophageal   BIOPSY  01/21/2022   Procedure: BIOPSY;  Surgeon: Malissa Hippo, MD;  [facility];  Service: Endoscopy;;   CATARACT EXTRACTION W/PHACO Left 09/11/2021   Procedure: CATARACT EXTRACTION PHACO AND INTRAOCULAR LENS PLACEMENT (IOC);  Surgeon: Fabio Pierce, MD;  Location: [facility];  Service: Ophthalmology;  Laterality: Left;  CDE: 5.25   CATARACT EXTRACTION W/PHACO Right 10/26/2021   Procedure: CATARACT EXTRACTION PHACO AND INTRAOCULAR LENS PLACEMENT (IOC);  Surgeon: Fabio Pierce, MD;  Location: [facility];  Service: Ophthalmology;  Laterality: Right;  CDE: 5.08   CHOLECYSTECTOMY     COLONOSCOPY     COLONOSCOPY WITH PROPOFOL N/A 03/04/2017   Procedure: COLONOSCOPY WITH PROPOFOL;  Surgeon: Malissa Hippo, MD;  [facility];  Service: Endoscopy;  Laterality: N/A;  10:10   COLONOSCOPY WITH PROPOFOL N/A 02/24/2023   Procedure: COLONOSCOPY WITH PROPOFOL;  Surgeon: Dolores Frame, MD;  [facility];  Service: Gastroenterology;  Laterality: N/A;  11:00AM;ASA 1-2   ESOPHAGEAL DILATION  06/07/2014   Procedure: ESOPHAGEAL DILATION;  Surgeon: Malissa Hippo, MD;  [facility];  Service: Endoscopy;;   ESOPHAGEAL DILATION N/A 08/27/2015   Procedure: ESOPHAGEAL DILATION;  Surgeon: Malissa Hippo, MD;  [facility];  Service: Endoscopy;  Laterality: N/A;   ESOPHAGEAL DILATION  08/04/2016   Procedure: ESOPHAGEAL DILATION;  Surgeon: Malissa Hippo, MD;  [facility];  Service: Endoscopy;;   ESOPHAGEAL DILATION N/A 04/13/2017   Procedure: ESOPHAGEAL DILATION;  Surgeon: Malissa Hippo, MD;  [facility];  Service: Endoscopy;  Laterality: N/A;   ESOPHAGEAL DILATION N/A 01/21/2022   Procedure: ESOPHAGEAL DILATION;  Surgeon: Malissa Hippo, MD;  [facility];  Service: Endoscopy;  Laterality: N/A;   ESOPHAGOGASTRODUODENOSCOPY  02/04/2012   Procedure: ESOPHAGOGASTRODUODENOSCOPY (EGD);  Surgeon: Malissa Hippo, MD;  [facility];  Service: Endoscopy;  Laterality: N/A;   320   ESOPHAGOGASTRODUODENOSCOPY  06/02/2012   Procedure: ESOPHAGOGASTRODUODENOSCOPY (EGD);  Surgeon: Malissa Hippo, MD;  [facility];  Service: Endoscopy;  Laterality: N/A;  1050   ESOPHAGOGASTRODUODENOSCOPY N/A 06/07/2014   Procedure: ESOPHAGOGASTRODUODENOSCOPY (EGD);  Surgeon: Malissa Hippo, MD;  [facility];  Service: Endoscopy;  Laterality: N/A;  1035-rescheduled 9/25 @ 8:30 Ann to notify pt   ESOPHAGOGASTRODUODENOSCOPY N/A 02/17/2015   Procedure: ESOPHAGOGASTRODUODENOSCOPY (EGD);  Surgeon: Malissa Hippo, MD;  [facility];  Service: Endoscopy;  Laterality: N/A;  730   ESOPHAGOGASTRODUODENOSCOPY N/A 08/27/2015   Procedure: ESOPHAGOGASTRODUODENOSCOPY (EGD);  Surgeon: Malissa Hippo, MD;  [facility];  Service: Endoscopy;  Laterality: N/A;  1155   ESOPHAGOGASTRODUODENOSCOPY N/A 08/04/2016   Procedure: ESOPHAGOGASTRODUODENOSCOPY (EGD);  Surgeon: Malissa Hippo, MD;  [facility];  Service: Endoscopy;  Laterality: N/A;  225   ESOPHAGOGASTRODUODENOSCOPY N/A 04/13/2017   Procedure: ESOPHAGOGASTRODUODENOSCOPY (EGD);  Surgeon: Malissa Hippo, MD;  [facility];  Service: Endoscopy;  Laterality: N/A;   ESOPHAGOGASTRODUODENOSCOPY (EGD) WITH ESOPHAGEAL DILATION N/A 05/31/2013   Procedure: ESOPHAGOGASTRODUODENOSCOPY (EGD) WITH ESOPHAGEAL DILATION;  Surgeon: Malissa Hippo, MD;  [facility];  Service: Endoscopy;  Laterality: N/A;   ESOPHAGOGASTRODUODENOSCOPY (  EGD) WITH PROPOFOL N/A 01/14/2021   Procedure: ESOPHAGOGASTRODUODENOSCOPY (EGD) WITH PROPOFOL;  Surgeon: Malissa Hippo, MD;  [facility];  Service: Endoscopy;  Laterality: N/A;  11:00 Am   ESOPHAGOGASTRODUODENOSCOPY (EGD) WITH PROPOFOL N/A 01/21/2022   Procedure: ESOPHAGOGASTRODUODENOSCOPY (EGD) WITH PROPOFOL;  Surgeon: Malissa Hippo, MD;  [facility];  Service: Endoscopy;  Laterality: N/A;  830   FLEXIBLE SIGMOIDOSCOPY N/A 05/31/2013    Procedure: FLEXIBLE SIGMOIDOSCOPY;  Surgeon: Malissa Hippo, MD;  [facility];  Service: Endoscopy;  Laterality: N/A;  250   FLEXIBLE SIGMOIDOSCOPY N/A 06/07/2014   Procedure: FLEXIBLE SIGMOIDOSCOPY;  Surgeon: Malissa Hippo, MD;  [facility];  Service: Endoscopy;  Laterality: N/A;   GIVENS CAPSULE STUDY  05/03/2011   Procedure: GIVENS CAPSULE STUDY;  Surgeon: Malissa Hippo, MD;  [facility];  Service: Endoscopy;  Laterality: N/A;  7:30 am   STOMACH SURGERY     2015- "ulcer surgery".   UPPER GASTROINTESTINAL ENDOSCOPY      Family History: Family History  Problem Relation Age of Onset   Healthy Mother    Heart attack Father    Healthy Sister    Colon cancer Neg Hx     Social History: Social History   Tobacco Use  Smoking Status Never   Passive exposure: Never  Smokeless Tobacco Never   Social History   Substance and Sexual Activity  Alcohol Use No   Alcohol/week: 0.0 standard drinks of alcohol   Social History   Substance and Sexual Activity  Drug Use No    Allergies: Allergies  Allergen Reactions   Aspirin Other (See Comments)    Ulcer, Reflux   Codeine Nausea And Vomiting    Speeds heart up, dizziness   Fentanyl Nausea And Vomiting    Confusion, weakness   Nsaids Other (See Comments)    reflux, ulcer   Propoxyphene N-Acetaminophen Nausea And Vomiting    Speeds heart up, dizziness    Medications: Current Outpatient Medications  Medication Sig Dispense Refill   acetaminophen (TYLENOL) 500 MG tablet Take 500 mg by mouth every 6 (six) hours as needed for headache.     ALPRAZolam (XANAX) 0.25 MG tablet Take 1 tablet (0.25 mg total) by mouth 3 (three) times daily as needed. 90 tablet 0   Ascorbic Acid (VITAMIN C WITH ROSE HIPS) 1000 MG tablet Take 1,000 mg by mouth daily.     Cholecalciferol (VITAMIN D3) 5000 UNITS CAPS Take 5,000 Units by mouth daily.      dicyclomine (BENTYL) 10 MG capsule Take 1 capsule (10 mg total) by  mouth every 12 (twelve) hours as needed (abdominal pain). 180 capsule 0   diphenoxylate-atropine (LOMOTIL) 2.5-0.025 MG tablet Take 1 tablet by mouth 4 (four) times daily as needed for diarrhea or loose stools. 120 tablet 2   ferrous gluconate (FERGON) 324 MG tablet Take 324 mg by mouth 2 (two) times daily with a meal.     metoprolol tartrate (LOPRESSOR) 25 MG tablet Take 1 tablet by mouth twice daily 180 tablet 0   Multiple Vitamin (MULTIVITAMIN WITH MINERALS) TABS tablet Take 1 tablet by mouth daily.     ondansetron (ZOFRAN) 4 MG tablet Take 1 tablet (4 mg total) by mouth 2 (two) times daily as needed for nausea or vomiting. 60 tablet 1   pantoprazole (PROTONIX) 40 MG tablet TAKE (1) TABLET DAILY BEFORE BREAKFAST. 90 tablet 0   promethazine (PHENADOZ) 25 MG suppository Place 1 suppository (25  mg total) rectally 2 (two) times daily as needed for nausea or vomiting. 12 each 1   promethazine (PHENERGAN) 25 MG tablet Take 1 tablet (25 mg total) by mouth 2 (two) times daily as needed for nausea or vomiting. 20 tablet 0   rosuvastatin (CRESTOR) 5 MG tablet Take 5 mg by mouth daily.     Simethicone (PHAZYME) 180 MG CAPS Take 1 capsule (180 mg total) by mouth 2 (two) times daily as needed. (Patient taking differently: Take 180 mg by mouth 2 (two) times daily as needed (bloating).)  0   sucralfate (CARAFATE) 1 g tablet Take 1 tablet (1 g total) by mouth 4 (four) times daily -  with meals and at bedtime. As needed (Patient taking differently: Take 1 g by mouth 4 (four) times daily as needed. As needed) 180 tablet 1   vitamin B-12 (CYANOCOBALAMIN) 1000 MCG tablet Take 1,000 mcg by mouth daily.     zinc gluconate 50 MG tablet Take 50 mg by mouth daily.     No current facility-administered medications for this visit.    Review of Systems: GENERAL: negative for malaise, night sweats HEENT: No changes in hearing or vision, no nose bleeds or other nasal problems. NECK: Negative for lumps, goiter, pain and  significant neck swelling RESPIRATORY: Negative for cough, wheezing CARDIOVASCULAR: Negative for chest pain, leg swelling, palpitations, orthopnea GI: SEE HPI MUSCULOSKELETAL: Negative for joint pain or swelling, back pain, and muscle pain. SKIN: Negative for lesions, rash PSYCH: Negative for sleep disturbance, mood disorder and recent psychosocial stressors. HEMATOLOGY Negative for prolonged bleeding, bruising easily, and swollen nodes. ENDOCRINE: Negative for cold or heat intolerance, polyuria, polydipsia and goiter. NEURO: negative for tremor, gait imbalance, syncope and seizures. The remainder of the review of systems is noncontributory.   Physical Exam: BP 110/77 (BP Location: Left Arm, Patient Position: Sitting, Cuff Size: Normal)   Pulse 77   Temp (!) 97.5 F (36.4 C) (Temporal)   Ht 5\' 4"  (1.626 m)   Wt 114 lb 12.8 oz (52.1 kg)   BMI 19.71 kg/m  GENERAL: The patient is AO x3, in no acute distress. HEENT: Head is normocephalic and atraumatic. EOMI are intact. Mouth is well hydrated and without lesions. NECK: Supple. No masses LUNGS: Clear to auscultation. No presence of rhonchi/wheezing/rales. Adequate chest expansion HEART: RRR, normal s1 and s2. ABDOMEN: moderately tender to palpation in the upper abdomen, no guarding, no peritoneal signs, and nondistended. BS +. No masses. EXTREMITIES: Without any cyanosis, clubbing, rash, lesions or edema. NEUROLOGIC: AOx3, no focal motor deficit. SKIN: no jaundice, no rashes  Imaging/Labs: as above  I personally reviewed and interpreted the available labs, imaging and endoscopic files.  Impression and Plan: BECKLEY PRZYBYLSKI is a 67 y.o. female with IBS-D, gastrectomy induced gastroparesis,GERD, anxiety, who presents for follow up of IBS-D , dysphagia and gastroparesis.  Patient has presented some recent exacerbation of her chronic abdominal pain, without presence of any red flag signs.  She has had relatively recent endoscopic  investigations without clear abnormality explaining her symptoms.  This could be related to her irritable bowel syndrome and gastroparesis, but given the recent exacerbation we will evaluate this further with a CT of the abdomen and pelvis with contrast.  She will continue for now with dicyclomine and Zofran as needed, as well as implementing gastroparesis dietary changes.  Given her history of iron deficiency, we will recheck her CBC and iron stores today.  -Schedule CT abdomen/pelvis with IV contrast -Continue with dicyclomine  every 8-12 hours for abdominal pain episodes -Eat small portions every day, 5-6 times per day -Check CBC and iron stores -Can take Zofran as needed for nausea episodes.  All questions were answered.      Lindsay Blazing, MD Gastroenterology and Hepatology [facility] Gastroenterology

## 2023-07-21 NOTE — Patient Instructions (Signed)
Schedule CT abdomen/pelvis with IV contrast Continue with dicyclomine every 8-12 hours for abdominal pain episodes Eat small portions every day, 5-6 times per day Perform blood workup Can take Zofran as needed for nausea episodes.

## 2023-07-21 NOTE — Telephone Encounter (Signed)
PA would only approve [facility].  PA done for [facility] and approved  Order ID: [id number]       Completed Approval Valid Through: 07/21/2023 - 09/18/2023   Order changed. Called pt, LMOVM to make aware.

## 2023-07-22 LAB — IRON,TIBC AND FERRITIN PANEL
Ferritin: 23 ng/mL (ref 15–150)
Iron Saturation: 17 % (ref 15–55)
Iron: 78 ug/dL (ref 27–139)
Total Iron Binding Capacity: 446 ug/dL (ref 250–450)
UIBC: 368 ug/dL (ref 118–369)

## 2023-07-22 LAB — CBC WITH DIFFERENTIAL/PLATELET
Basophils Absolute: 0.1 10*3/uL (ref 0.0–0.2)
Basos: 1 %
EOS (ABSOLUTE): 0.2 10*3/uL (ref 0.0–0.4)
Eos: 3 %
Hematocrit: 37.2 % (ref 34.0–46.6)
Hemoglobin: 12.1 g/dL (ref 11.1–15.9)
Immature Grans (Abs): 0 10*3/uL (ref 0.0–0.1)
Immature Granulocytes: 0 %
Lymphocytes Absolute: 2 10*3/uL (ref 0.7–3.1)
Lymphs: 31 %
MCH: 28.7 pg (ref 26.6–33.0)
MCHC: 32.5 g/dL (ref 31.5–35.7)
MCV: 88 fL (ref 79–97)
Monocytes Absolute: 0.5 10*3/uL (ref 0.1–0.9)
Monocytes: 8 %
Neutrophils Absolute: 3.8 10*3/uL (ref 1.4–7.0)
Neutrophils: 57 %
Platelets: 186 10*3/uL (ref 150–450)
RBC: 4.21 x10E6/uL (ref 3.77–5.28)
RDW: 12.5 % (ref 11.7–15.4)
WBC: 6.5 10*3/uL (ref 3.4–10.8)

## 2023-07-22 NOTE — Telephone Encounter (Signed)
Patient called in and I made her aware.

## 2023-07-25 ENCOUNTER — Encounter (INDEPENDENT_AMBULATORY_CARE_PROVIDER_SITE_OTHER): Payer: Self-pay

## 2023-07-25 ENCOUNTER — Ambulatory Visit (INDEPENDENT_AMBULATORY_CARE_PROVIDER_SITE_OTHER): Payer: BC Managed Care – PPO | Admitting: Gastroenterology

## 2023-07-25 ENCOUNTER — Ambulatory Visit
Admission: RE | Admit: 2023-07-25 | Discharge: 2023-07-25 | Disposition: A | Discharge status: Home / Self Care | Payer: BC Managed Care – PPO | Source: Ambulatory Visit | Attending: Gastroenterology | Admitting: Gastroenterology

## 2023-07-25 ENCOUNTER — Telehealth (INDEPENDENT_AMBULATORY_CARE_PROVIDER_SITE_OTHER): Payer: Self-pay | Admitting: Gastroenterology

## 2023-07-25 DIAGNOSIS — R1010 Upper abdominal pain, unspecified: Secondary | ICD-10-CM

## 2023-07-25 MED ORDER — IOPAMIDOL (ISOVUE-300) INJECTION 61%
100.0000 mL | Freq: Once | INTRAVENOUS | Status: AC | PRN
Start: 2023-07-25 — End: 2023-07-25
  Administered 2023-07-25: 80 mL via INTRAVENOUS

## 2023-07-25 NOTE — Telephone Encounter (Signed)
Pt called in and states she is needing to know if CT is with contrast. Pt was seen 07/21/23 by Dr.Castaneda. CT only approved at [facility]. Looked back at office notes and it states with IV contrast. Pt was told she could do a walk in for CT. Pt was told by [facility] that she needed to call us to see if it was with contrast or not. Will fax order over to [facility].

## 2023-07-28 ENCOUNTER — Ambulatory Visit (INDEPENDENT_AMBULATORY_CARE_PROVIDER_SITE_OTHER): Payer: BC Managed Care – PPO | Admitting: Gastroenterology

## 2023-08-03 ENCOUNTER — Encounter (INDEPENDENT_AMBULATORY_CARE_PROVIDER_SITE_OTHER): Payer: Self-pay

## 2023-08-04 NOTE — Telephone Encounter (Signed)
report faxed to PCP  

## 2023-08-05 ENCOUNTER — Other Ambulatory Visit: Payer: Self-pay | Admitting: Cardiology

## 2023-09-13 ENCOUNTER — Ambulatory Visit: Payer: BC Managed Care – PPO | Attending: Nurse Practitioner | Admitting: Nurse Practitioner

## 2023-09-13 ENCOUNTER — Encounter: Payer: Self-pay | Admitting: Nurse Practitioner

## 2023-09-13 VITALS — BP 112/70 | HR 84 | Ht 64.0 in | Wt 114.2 lb

## 2023-09-13 DIAGNOSIS — I471 Supraventricular tachycardia, unspecified: Secondary | ICD-10-CM | POA: Diagnosis not present

## 2023-09-13 DIAGNOSIS — I38 Endocarditis, valve unspecified: Secondary | ICD-10-CM

## 2023-09-13 DIAGNOSIS — R0609 Other forms of dyspnea: Secondary | ICD-10-CM

## 2023-09-13 DIAGNOSIS — R002 Palpitations: Secondary | ICD-10-CM | POA: Diagnosis not present

## 2023-09-13 MED ORDER — METOPROLOL TARTRATE 25 MG PO TABS: 25.0000 mg | ORAL_TABLET | Freq: Two times a day (BID) | ORAL | 3 refills | Status: DC

## 2023-09-13 NOTE — Progress Notes (Signed)
  Cardiology Office Note:  .   Date:  09/13/2023  ID:  Lindsay Lawrence, DOB [birth year], MRN [id number] PCP: Rosamond Leta NOVAK, MD  [facility] Providers Cardiologist:  Alvan Carrier, MD    History of Present Illness: .   Lindsay Lawrence is a 67 y.o. female with a PMH of PSVT, palpitations, GERD, hx of chest pain, valvular insufficiency, who presents today for 1 year follow-up.   Last seen by Dr. Carrier Alvan on July 02, 2022. She reported infrequent, mild palpitations. It was discussed about possibly increasing Lopressor , but pt reported her symptoms were not bad enough to make the medication change.   Today she presents for follow-up. She says she is overall doing well. Continues to note, mild infrequent palpitations as well as DOE with levels of high exertion, such as walking at a brisk pace. Denies any chest pain,  syncope, presyncope, dizziness, orthopnea, PND, swelling or significant weight changes, acute bleeding, or claudication.  ROS: Negative. See HPI.   Studies Reviewed: SABRA    EKG:  EKG Interpretation Date/Time:  Tuesday September 13 2023 11:32:00 EST Ventricular Rate:  84 PR Interval:  144 QRS Duration:  82 QT Interval:  350 QTC Calculation: 413 R Axis:   87  Text Interpretation: Normal sinus rhythm Normal ECG No previous ECGs available Confirmed by Miriam Norris [id number]) on 09/13/2023 11:48:48 AM    Echo 03/2015 [facility]:  LVEF 60-65%, mild MR, mild TR.   Physical Exam:   VS:  BP 112/70   Pulse 84   Ht 5' 4 (1.626 m)   Wt 114 lb 3.2 oz (51.8 kg)   SpO2 98%   BMI 19.60 kg/m    Wt Readings from Last 3 Encounters:  09/13/23 114 lb 3.2 oz (51.8 kg)  07/21/23 114 lb 12.8 oz (52.1 kg)  02/24/23 110 lb (49.9 kg)    GEN: Well nourished, well developed in no acute distress NECK: No JVD; No carotid bruits CARDIAC: S1/S2, RRR, no murmurs, rubs, gallops RESPIRATORY:  Clear to auscultation without rales, wheezing or rhonchi  ABDOMEN: Soft,  non-tender, non-distended EXTREMITIES:  No edema; No deformity   ASSESSMENT AND PLAN: .    Palpitations, PSVT Continues to note, mild infrequent palpitations similar to last OV. Pt declines to make adjustments to her medication regimen today. Continue Lopressor , will provide refill for her. Heart healthy diet and regular cardiovascular exercise encouraged.   Valvular insufficiency, DOE Admits to DOE noted with high levels of exertion, such as walking briskly. TTE in 2016 revealed normal EF, mild MR and mild TR. Offered/recommended to update Echo, however pt declines. Can reconsider this at next office visit if symptoms have not improved.    Dispo: Care and ED precautions discussed. Follow-up with me/APP in 3 months or sooner if anything changes.   Signed, Norris Miriam, NP

## 2023-09-13 NOTE — Patient Instructions (Signed)
 Medication Instructions:  Your physician recommends that you continue on your current medications as directed. Please refer to the Current Medication list given to you today.  *If you need a refill on your cardiac medications before your next appointment, please call your pharmacy*   Lab Work: None If you have labs (blood work) drawn today and your tests are completely normal, you will receive your results only by: MyChart Message (if you have MyChart) OR A paper copy in the mail If you have any lab test that is abnormal or we need to change your treatment, we will call you to review the results.   Testing/Procedures: None   Follow-Up: At [facility], you and your health needs are our priority.  As part of our continuing mission to provide you with exceptional heart care, we have created designated Provider Care Teams.  These Care Teams include your primary Cardiologist (physician) and Advanced Practice Providers (APPs -  Physician Assistants and Nurse Practitioners) who all work together to provide you with the care you need, when you need it.  We recommend signing up for the patient portal called MyChart.  Sign up information is provided on this After Visit Summary.  MyChart is used to connect with patients for Virtual Visits (Telemedicine).  Patients are able to view lab/test results, encounter notes, upcoming appointments, etc.  Non-urgent messages can be sent to your provider as well.   To learn more about what you can do with MyChart, go to [email or web address].    Your next appointment:   3 month(s)  Provider:   You may see Alvan Carrier, MD or the following Advanced Practice Provider on your designated Care Team:   Almarie Crate, NP    Other Instructions

## 2023-12-13 ENCOUNTER — Encounter: Payer: Self-pay | Admitting: Nurse Practitioner

## 2023-12-13 ENCOUNTER — Ambulatory Visit: Payer: BC Managed Care – PPO | Attending: Nurse Practitioner | Admitting: Nurse Practitioner

## 2023-12-13 VITALS — BP 112/68 | HR 72 | Ht 64.0 in | Wt 114.0 lb

## 2023-12-13 DIAGNOSIS — I38 Endocarditis, valve unspecified: Secondary | ICD-10-CM | POA: Diagnosis not present

## 2023-12-13 DIAGNOSIS — R002 Palpitations: Secondary | ICD-10-CM

## 2023-12-13 DIAGNOSIS — E785 Hyperlipidemia, unspecified: Secondary | ICD-10-CM | POA: Diagnosis not present

## 2023-12-13 DIAGNOSIS — I471 Supraventricular tachycardia, unspecified: Secondary | ICD-10-CM | POA: Diagnosis not present

## 2023-12-13 NOTE — Progress Notes (Unsigned)
  Cardiology Office Note:  .   Date:  12/13/2023 ID:  Lindsay Lawrence, DOB October 02, 1955, MRN [id number] PCP: Ignatius Specking, MD  [facility] Providers Cardiologist:  Dina Rich, MD    History of Present Illness: .   Lindsay Lawrence is a 68 y.o. female with a PMH of PSVT, palpitations, GERD, hx of chest pain, hyperlipidemia, valvular insufficiency, who presents today for scheduled follow-up.  Last seen by Dr. Dina Rich on July 02, 2022. She reported infrequent, mild palpitations. It was discussed about possibly increasing Lopressor, but pt reported her symptoms were not bad enough to make the medication change.   09/13/2023 - Today she presents for follow-up. She says she is overall doing well. Continues to note, mild infrequent palpitations as well as DOE with levels of high exertion, such as walking at a brisk pace. Denies any chest pain,  syncope, presyncope, dizziness, orthopnea, PND, swelling or significant weight changes, acute bleeding, or claudication.  12/13/2023 -presents today for follow-up.  Doing well.  Denies any acute cardiac complaints or issues. Denies any chest pain, shortness of breath, palpitations, syncope, presyncope, dizziness, orthopnea, PND, swelling or significant weight changes, acute bleeding, or claudication.  ROS: Negative. See HPI.   Studies Reviewed: Marland Kitchen    EKG: EKG is not ordered today.     Echo 03/2015 [facility]:  LVEF 60-65%, mild MR, mild TR.   Physical Exam:   VS:  BP 112/68   Pulse 72   Ht 5\' 4"  (1.626 m)   Wt 114 lb (51.7 kg)   SpO2 98%   BMI 19.57 kg/m    Wt Readings from Last 3 Encounters:  12/13/23 114 lb (51.7 kg)  09/13/23 114 lb 3.2 oz (51.8 kg)  07/21/23 114 lb 12.8 oz (52.1 kg)    GEN: Well nourished, well developed in no acute distress NECK: No JVD; No carotid bruits CARDIAC: S1/S2, RRR, no murmurs, rubs, gallops RESPIRATORY:  Clear to auscultation without rales, wheezing or rhonchi  ABDOMEN: Soft,  non-tender, non-distended EXTREMITIES:  No edema; No deformity   ASSESSMENT AND PLAN: .    Palpitations, PSVT Denies any tachycardia or palpitations.  Continue Lopressor. Heart healthy diet and regular cardiovascular exercise encouraged.   Valvular insufficiency TTE in 2016 revealed normal EF, mild MR and mild TR. Offered/recommended to update Echo, no murmur noted on exam.  Came to shared medical decision with patient that we will hold off arranging echocardiogram at this time.   3.  Hyperlipidemia LDL 52 from October 2024.  She is at goal.  Continue rosuvastatin. Heart healthy diet and regular cardiovascular exercise encouraged.   Dispo: Care and ED precautions discussed. Follow-up with Dr. Dina Rich or APP in 6 months or sooner if anything changes.   Signed, Sharlene Dory, NP

## 2023-12-13 NOTE — Patient Instructions (Addendum)

## 2023-12-26 ENCOUNTER — Encounter (INDEPENDENT_AMBULATORY_CARE_PROVIDER_SITE_OTHER): Payer: Self-pay

## 2023-12-26 ENCOUNTER — Other Ambulatory Visit (INDEPENDENT_AMBULATORY_CARE_PROVIDER_SITE_OTHER): Payer: Self-pay

## 2023-12-26 MED ORDER — DIPHENOXYLATE-ATROPINE 2.5-0.025 MG PO TABS: 1.0000 | ORAL_TABLET | Freq: Four times a day (QID) | ORAL | 2 refills | Status: AC | PRN

## 2023-12-26 NOTE — Telephone Encounter (Signed)
 Script left at the front desk for patient to pick up. Message left on patient vm, and asked if the patient wants me to mail it or to leave it at the front desk for pick up.

## 2023-12-26 NOTE — Telephone Encounter (Signed)
 Patient is requesting Lomotil 2.5 mg #120 to be sent to [facility].

## 2024-01-02 ENCOUNTER — Telehealth (INDEPENDENT_AMBULATORY_CARE_PROVIDER_SITE_OTHER): Payer: Self-pay | Admitting: *Deleted

## 2024-01-02 ENCOUNTER — Other Ambulatory Visit (INDEPENDENT_AMBULATORY_CARE_PROVIDER_SITE_OTHER): Payer: Self-pay | Admitting: Gastroenterology

## 2024-01-02 DIAGNOSIS — R110 Nausea: Secondary | ICD-10-CM

## 2024-01-02 MED ORDER — SUCRALFATE 1 G PO TABS: 1.0000 g | ORAL_TABLET | Freq: Three times a day (TID) | ORAL | 1 refills | Status: AC

## 2024-01-02 NOTE — Telephone Encounter (Signed)
 Patient requesting refill on carafate  to [facility]. Last OV 07/21/23  [phone]

## 2024-01-02 NOTE — Telephone Encounter (Signed)
 Pt.notified

## 2024-01-02 NOTE — Telephone Encounter (Signed)
 Medication sent to pharmacy

## 2024-01-07 ENCOUNTER — Other Ambulatory Visit (INDEPENDENT_AMBULATORY_CARE_PROVIDER_SITE_OTHER): Payer: Self-pay | Admitting: Gastroenterology

## 2024-01-07 DIAGNOSIS — K219 Gastro-esophageal reflux disease without esophagitis: Secondary | ICD-10-CM

## 2024-04-09 ENCOUNTER — Other Ambulatory Visit (INDEPENDENT_AMBULATORY_CARE_PROVIDER_SITE_OTHER): Payer: Self-pay | Admitting: Gastroenterology

## 2024-04-09 DIAGNOSIS — K219 Gastro-esophageal reflux disease without esophagitis: Secondary | ICD-10-CM

## 2024-06-12 ENCOUNTER — Ambulatory Visit: Attending: Cardiology | Admitting: Cardiology

## 2024-06-12 ENCOUNTER — Encounter: Payer: Self-pay | Admitting: Cardiology

## 2024-06-12 VITALS — BP 118/75 | HR 66 | Ht 64.0 in | Wt 122.4 lb

## 2024-06-12 DIAGNOSIS — R002 Palpitations: Secondary | ICD-10-CM

## 2024-06-12 DIAGNOSIS — E782 Mixed hyperlipidemia: Secondary | ICD-10-CM

## 2024-06-12 DIAGNOSIS — I340 Nonrheumatic mitral (valve) insufficiency: Secondary | ICD-10-CM | POA: Diagnosis not present

## 2024-06-12 DIAGNOSIS — I471 Supraventricular tachycardia, unspecified: Secondary | ICD-10-CM | POA: Diagnosis not present

## 2024-06-12 NOTE — Patient Instructions (Signed)
 Medication Instructions:  Continue all current medications.   Labwork: none  Testing/Procedures: none  Follow-Up: 6 months   Any Other Special Instructions Will Be Listed Below (If Applicable).   If you need a refill on your cardiac medications before your next appointment, please call your pharmacy.

## 2024-06-12 NOTE — Progress Notes (Signed)
 Clinical Summary Lindsay Lawrence is a 68 y.o.female seen today for follow up of the following medical problems.    1. PSVT - has been on lopressor  - some recent symptoms. Occurring daily, lasts just few minutes - compliant with meds - limiting caffeine, no EtOH.  - prior issues with low bp's but none recently.       - occasional palpitations, occurs roughly 4-5 times a month. Lasts just a few minutes and resolves - overall tolerable.    2. HLD  - upcoming labs with pcp  3. 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   Syncope    Vasovagal     Allergies  Allergen Reactions   Aspirin Other (See Comments)    Ulcer, Reflux   Codeine Nausea And Vomiting    Speeds heart up, dizziness   Fentanyl Nausea And Vomiting    Confusion, weakness   Nsaids Other (See Comments)    reflux, ulcer   Propoxyphene N-Acetaminophen  Nausea And Vomiting    Speeds heart up, dizziness     Current Outpatient Medications  Medication Sig Dispense Refill   acetaminophen  (TYLENOL ) 500 MG tablet Take 500 mg by mouth every 6 (six) hours as needed for headache.     ALPRAZolam  (XANAX ) 0.25 MG tablet Take 1 tablet (0.25 mg total) by mouth 3 (three) times daily as needed. 90 tablet 0   Ascorbic Acid (VITAMIN C WITH ROSE HIPS) 1000 MG tablet Take 1,000 mg by mouth daily.     Cholecalciferol (VITAMIN D3) 5000 UNITS CAPS Take 5,000 Units by mouth daily.      dicyclomine   (BENTYL ) 10 MG capsule Take 1 capsule (10 mg total) by mouth every 12 (twelve) hours as needed (abdominal pain). 180 capsule 0   diphenoxylate -atropine  (LOMOTIL ) 2.5-0.025 MG tablet Take 1 tablet by mouth 4 (four) times daily as needed for diarrhea or loose stools. 120 tablet 2   ferrous gluconate (FERGON) 324 MG tablet Take 324 mg by mouth 2 (two) times daily with a meal.     metoprolol  tartrate (LOPRESSOR ) 25 MG tablet Take 1 tablet (25 mg total) by mouth 2 (two) times daily. 180 tablet 3   Multiple Vitamin (MULTIVITAMIN WITH MINERALS) TABS tablet Take 1 tablet by mouth daily.     ondansetron  (ZOFRAN ) 4 MG tablet Take 1 tablet (4 mg total) by mouth 2 (two) times daily as needed for nausea or vomiting. 60 tablet 1   pantoprazole  (PROTONIX ) 40 MG tablet take (1) tablet daily before breakfast. 90 tablet 0   promethazine  (PHENADOZ) 25 MG suppository Place 1 suppository (25 mg total) rectally 2 (two) times daily as needed for nausea or vomiting. 12 each 1   promethazine  (PHENERGAN ) 25 MG tablet Take 1 tablet (25 mg total) by mouth 2 (two) times daily as needed for nausea  or vomiting. 20 tablet 0   rosuvastatin (CRESTOR) 5 MG tablet Take 5 mg by mouth daily.     Simethicone  (PHAZYME) 180 MG CAPS Take 1 capsule (180 mg total) by mouth 2 (two) times daily as needed. (Patient taking differently: Take 180 mg by mouth 2 (two) times daily as needed (bloating).)  0   sucralfate  (CARAFATE ) 1 g tablet Take 1 tablet (1 g total) by mouth 4 (four) times daily -  with meals and at bedtime. As needed 180 tablet 1   vitamin B-12 (CYANOCOBALAMIN) 1000 MCG tablet Take 1,000 mcg by mouth daily.     zinc gluconate 50 MG tablet Take 50 mg by mouth daily.     No current facility-administered medications for this visit.     Past Surgical History:  Procedure Laterality Date   ABDOMINAL HYSTERECTOMY     BIOPSY  08/04/2016   Procedure: BIOPSY;  Surgeon: Claudis RAYMOND Rivet, MD;  Location: [facility];  Service: Endoscopy;;   gastric   BIOPSY  01/14/2021   Procedure: BIOPSY;  Surgeon: Rivet Claudis RAYMOND, MD;  Location: [facility];  Service: Endoscopy;;  gastric esophageal   BIOPSY  01/21/2022   Procedure: BIOPSY;  Surgeon: Rivet Claudis RAYMOND, MD;  Location: [facility];  Service: Endoscopy;;   CATARACT EXTRACTION W/PHACO Left 09/11/2021   Procedure: CATARACT EXTRACTION PHACO AND INTRAOCULAR LENS PLACEMENT (IOC);  Surgeon: Harrie Agent, MD;  Location: [facility];  Service: Ophthalmology;  Laterality: Left;  CDE: 5.25   CATARACT EXTRACTION W/PHACO Right 10/26/2021   Procedure: CATARACT EXTRACTION PHACO AND INTRAOCULAR LENS PLACEMENT (IOC);  Surgeon: Harrie Agent, MD;  Location: [facility];  Service: Ophthalmology;  Laterality: Right;  CDE: 5.08   CHOLECYSTECTOMY     COLONOSCOPY     COLONOSCOPY WITH PROPOFOL  N/A 03/04/2017   Procedure: COLONOSCOPY WITH PROPOFOL ;  Surgeon: Rivet Claudis RAYMOND, MD;  Location: [facility];  Service: Endoscopy;  Laterality: N/A;  10:10   COLONOSCOPY WITH PROPOFOL  N/A 02/24/2023   Procedure: COLONOSCOPY WITH PROPOFOL ;  Surgeon: Eartha Angelia Sieving, MD;  Location: [facility];  Service: Gastroenterology;  Laterality: N/A;  11:00AM;ASA 1-2   ESOPHAGEAL DILATION  06/07/2014   Procedure: ESOPHAGEAL DILATION;  Surgeon: Claudis RAYMOND Rivet, MD;  [facility]: [facility];  Service: Endoscopy;;   ESOPHAGEAL DILATION N/A 08/27/2015   Procedure: ESOPHAGEAL DILATION;  Surgeon: Claudis RAYMOND Rivet, MD;  [facility]: [facility];  Service: Endoscopy;  Laterality: N/A;   ESOPHAGEAL DILATION  08/04/2016   Procedure: ESOPHAGEAL DILATION;  Surgeon: Claudis RAYMOND Rivet, MD;  [facility]: [facility];  Service: Endoscopy;;   ESOPHAGEAL DILATION N/A 04/13/2017   Procedure: ESOPHAGEAL DILATION;  Surgeon: Rivet Claudis RAYMOND, MD;  [facility]: [facility];  Service: Endoscopy;  Laterality: N/A;   ESOPHAGEAL DILATION N/A 01/21/2022   Procedure: ESOPHAGEAL DILATION;  Surgeon: Rivet Claudis RAYMOND, MD;  [facility]: [facility];  Service:  Endoscopy;  Laterality: N/A;   ESOPHAGOGASTRODUODENOSCOPY  02/04/2012   Procedure: ESOPHAGOGASTRODUODENOSCOPY (EGD);  Surgeon: Claudis RAYMOND Rivet, MD;  [facility]: [facility];  Service: Endoscopy;  Laterality: N/A;  320   ESOPHAGOGASTRODUODENOSCOPY  06/02/2012   Procedure: ESOPHAGOGASTRODUODENOSCOPY (EGD);  Surgeon: Claudis RAYMOND Rivet, MD;  [facility]: [facility];  Service: Endoscopy;  Laterality: N/A;  1050   ESOPHAGOGASTRODUODENOSCOPY N/A 06/07/2014   Procedure: ESOPHAGOGASTRODUODENOSCOPY (EGD);  Surgeon: Claudis RAYMOND Rivet, MD;  [facility]: [facility];  Service: Endoscopy;  Laterality: N/A;  1035-rescheduled 9/25 @ 8:30 Ann to notify pt   ESOPHAGOGASTRODUODENOSCOPY  N/A 02/17/2015   Procedure: ESOPHAGOGASTRODUODENOSCOPY (EGD);  Surgeon: Claudis RAYMOND Rivet, MD;  [facility]: [facility];  Service: Endoscopy;  Laterality: N/A;  730   ESOPHAGOGASTRODUODENOSCOPY N/A 08/27/2015   Procedure: ESOPHAGOGASTRODUODENOSCOPY (EGD);  Surgeon: Claudis RAYMOND Rivet, MD;  [facility]: [facility];  Service: Endoscopy;  Laterality: N/A;  1155   ESOPHAGOGASTRODUODENOSCOPY N/A 08/04/2016   Procedure: ESOPHAGOGASTRODUODENOSCOPY (EGD);  Surgeon: Claudis RAYMOND Rivet, MD;  [facility]: [facility];  Service: Endoscopy;  Laterality: N/A;  225   ESOPHAGOGASTRODUODENOSCOPY N/A 04/13/2017   Procedure: ESOPHAGOGASTRODUODENOSCOPY (EGD);  Surgeon: Rivet Claudis RAYMOND, MD;  [facility]: [facility];  Service: Endoscopy;  Laterality: N/A;   ESOPHAGOGASTRODUODENOSCOPY (EGD) WITH ESOPHAGEAL DILATION N/A 05/31/2013   Procedure: ESOPHAGOGASTRODUODENOSCOPY (EGD) WITH ESOPHAGEAL DILATION;  Surgeon: Claudis RAYMOND Rivet, MD;  [facility]: [facility];  Service: Endoscopy;  Laterality: N/A;   ESOPHAGOGASTRODUODENOSCOPY (EGD) WITH PROPOFOL  N/A 01/14/2021   Procedure: ESOPHAGOGASTRODUODENOSCOPY (EGD) WITH PROPOFOL ;  Surgeon: Rivet Claudis RAYMOND, MD;  [facility]: [facility];  Service: Endoscopy;  Laterality: N/A;  11:00 Am   ESOPHAGOGASTRODUODENOSCOPY (EGD) WITH PROPOFOL  N/A  01/21/2022   Procedure: ESOPHAGOGASTRODUODENOSCOPY (EGD) WITH PROPOFOL ;  Surgeon: Rivet Claudis RAYMOND, MD;  [facility]: [facility];  Service: Endoscopy;  Laterality: N/A;  830   FLEXIBLE SIGMOIDOSCOPY N/A 05/31/2013   Procedure: FLEXIBLE SIGMOIDOSCOPY;  Surgeon: Claudis RAYMOND Rivet, MD;  [facility]: [facility];  Service: Endoscopy;  Laterality: N/A;  250   FLEXIBLE SIGMOIDOSCOPY N/A 06/07/2014   Procedure: FLEXIBLE SIGMOIDOSCOPY;  Surgeon: Claudis RAYMOND Rivet, MD;  [facility]: [facility];  Service: Endoscopy;  Laterality: N/A;   GIVENS CAPSULE STUDY  05/03/2011   Procedure: GIVENS CAPSULE STUDY;  Surgeon: Claudis RAYMOND Rivet, MD;  Location: [facility];  Service: Endoscopy;  Laterality: N/A;  7:30 am   STOMACH SURGERY     2015- ulcer surgery.   UPPER GASTROINTESTINAL ENDOSCOPY       Allergies  Allergen Reactions   Aspirin Other (See Comments)    Ulcer, Reflux   Codeine Nausea And Vomiting    Speeds heart up, dizziness   Fentanyl Nausea And Vomiting    Confusion, weakness   Nsaids Other (See Comments)    reflux, ulcer   Propoxyphene N-Acetaminophen  Nausea And Vomiting    Speeds heart up, dizziness      Family History  Problem Relation Age of Onset   Healthy Mother    Heart attack Father    Healthy Sister    Colon cancer Neg Hx      Social History Ms. Luckett reports that she has never smoked. She has never been exposed to tobacco smoke. She has never used smokeless tobacco. Ms. Kent reports no history of alcohol use.    Physical Examination Today's Vitals   06/12/24 1559  BP: 118/75  Pulse: 66  SpO2: 97%  Weight: 122 lb 6.4 oz (55.5 kg)  Height: 5' 4 (1.626 m)   Body mass index is 21.01 kg/m.  Gen: resting comfortably, no acute distress HEENT: no scleral icterus, pupils equal round and reactive, no palptable cervical adenopathy,  CV: RRR, no m/rg, no jvd Resp: Clear to auscultation bilaterally GI: abdomen is soft, non-tender, non-distended, normal bowel sounds, no  hepatosplenomegaly MSK: extremities are warm, no edema.  Skin: warm, no rash Neuro:  no focal deficits Psych: appropriate affect     Assessment and Plan   1. PSVT/Palpitations -mild tolerable symptoms, she reports not to the level she favors adjusting metoprolol  - continue current mds  2. HLD -  at goal, continue current meds  3. Mitral regurgitation - mild MR by pcp echo in 2016, she has no murmur and no symptoms, would not pursue repeat echo at this time   F/u 6 months     Dorn PHEBE Ross, M.D.

## 2024-06-27 ENCOUNTER — Encounter (INDEPENDENT_AMBULATORY_CARE_PROVIDER_SITE_OTHER): Payer: Self-pay | Admitting: Gastroenterology

## 2024-07-16 ENCOUNTER — Other Ambulatory Visit (INDEPENDENT_AMBULATORY_CARE_PROVIDER_SITE_OTHER): Payer: Self-pay | Admitting: Gastroenterology

## 2024-07-16 DIAGNOSIS — K219 Gastro-esophageal reflux disease without esophagitis: Secondary | ICD-10-CM

## 2024-07-23 ENCOUNTER — Ambulatory Visit (INDEPENDENT_AMBULATORY_CARE_PROVIDER_SITE_OTHER): Payer: BC Managed Care – PPO | Admitting: Gastroenterology

## 2024-08-02 ENCOUNTER — Ambulatory Visit (INDEPENDENT_AMBULATORY_CARE_PROVIDER_SITE_OTHER): Admitting: Gastroenterology

## 2024-08-02 ENCOUNTER — Encounter (INDEPENDENT_AMBULATORY_CARE_PROVIDER_SITE_OTHER): Payer: Self-pay | Admitting: Gastroenterology

## 2024-08-02 VITALS — BP 103/68 | HR 73 | Temp 97.8°F | Ht 64.0 in | Wt 118.0 lb

## 2024-08-02 DIAGNOSIS — K3184 Gastroparesis: Secondary | ICD-10-CM

## 2024-08-02 DIAGNOSIS — K580 Irritable bowel syndrome with diarrhea: Secondary | ICD-10-CM

## 2024-08-02 DIAGNOSIS — K219 Gastro-esophageal reflux disease without esophagitis: Secondary | ICD-10-CM | POA: Diagnosis not present

## 2024-08-02 NOTE — Patient Instructions (Signed)
 Continue with dicyclomine  every 8-12 hours for abdominal pain episodes Eat small portions every day, 5-6 times per day Continue with Lomotil  as needed for diarrhea

## 2024-08-02 NOTE — Progress Notes (Signed)
 Toribio Fortune, M.D. Gastroenterology & Hepatology [facility]enter[facility] [address] [place], [place] [address]  Primary Care Physician: Rosamond Leta NOVAK, MD [address] [place] [address]  I will communicate my assessment and recommendations to the referring MD via EMR.  Problems: Gastroparesis due to partial gastrectomy for large gastric ulcers IBS-D Chronic dysphagia   History of Present Illness: Lindsay Lawrence is a 68 y.o. female with IBS-D, gastrectomy induced gastroparesis,GERD, anxiety, who presents for follow up of IBS-D , dysphagia and gastroparesis.   The patient was last seen on 07/21/2023. At that time, the patient was advised to continue taking dicyclomine  as needed for abdominal pain and to eat small portions daily.  A CT of the abdomen and pelvis with IV contrast was performed on 07/30/2023 showing possible thickening of the urinary bladder but no other abnormalities.  Most recent iron stores from 07/21/2023 showed mildly increased iron saturation of 17%, ferritin 23, iron 78 and hemoglobin of 12.4.  Patient has been taking Lomotil  as needed for diarrhea episodes chronically. She is having watery to soft Bms per day. States may have a 1-4 Bms qday, but only takes Lomotil  if she ahs multiple Bms. States has some abdominal pain issues chronically as well, but not any worsening - sometimes takes Bentyl  if needed.  States she feels sometimes when eating food hangs in the throat. States she has had these symptoms for long time.   The patient denies having any nausea, vomiting, fever, chills, hematochezia, melena, hematemesis, jaundice, pruritus or weight loss.  Has been stressed as mother passed away a week and a half.  Last EGD: 01/21/2022 - Normal hypopharynx. - Normal esophagus. - Z-line irregular, 40 cm from the incisors. - No endoscopic esophageal abnormality to explain patient's dysphagia. Esophagus dilated. Dilated. - Patent  Billroth I gastroduodenostomy was found, characterized by edema, erythema and friable mucosa. Biopsied. - Normal second portion of the duodenum, third portion of the duodenum and fourth portion of the duodenum. Comment: ? Bile induced gastritis   Path: FINAL MICROSCOPIC DIAGNOSIS:   A. STOMACH, BODY, BIOPSY:  - Gastric oxyntic mucosa with prominent nonspecific reactive gastropathy  - Helicobacter pylori-like organisms are not identified on routine HE  stain    Last Colonoscopy:02/2023 Diverticulosis.  Past Medical History: Past Medical History:  Diagnosis Date   Anemia    Anxiety    Chest pain    Nuclear, normal, 2010,  //   hospital August, 2013 no evidence of injury   Ejection fraction    EF 65%, echo, April 19, 2012   Gastric ulcer    GERD (gastroesophageal reflux disease)    History of palpitations    Irritable bowel syndrome    Mitral regurgitation    Mild, echo, August, 2013   Palpitations    Reactive airway disease    Pulmonary function studies from June, 2013, show the patient does have significant response to bronchodilators.   Syncope    Vasovagal    Past Surgical History: Past Surgical History:  Procedure Laterality Date   ABDOMINAL HYSTERECTOMY     BIOPSY  08/04/2016   Procedure: BIOPSY;  Surgeon: Claudis RAYMOND Rivet, MD;  [facility];  Service: Endoscopy;;  gastric   BIOPSY  01/14/2021   Procedure: BIOPSY;  Surgeon: Rivet Claudis RAYMOND, MD;  [facility];  Service: Endoscopy;;  gastric esophageal   BIOPSY  01/21/2022   Procedure: BIOPSY;  Surgeon: Rivet Claudis RAYMOND, MD;  [facility];  Service: Endoscopy;;   CATARACT EXTRACTION W/PHACO Left 09/11/2021   Procedure: CATARACT EXTRACTION PHACO AND INTRAOCULAR LENS PLACEMENT (IOC);  Surgeon: Harrie Agent, MD;  Location: [facility];  Service: Ophthalmology;  Laterality: Left;  CDE: 5.25   CATARACT EXTRACTION W/PHACO Right 10/26/2021   Procedure: CATARACT EXTRACTION PHACO AND INTRAOCULAR  LENS PLACEMENT (IOC);  Surgeon: Harrie Agent, MD;  [facility]: [facility];  Service: Ophthalmology;  Laterality: Right;  CDE: 5.08   CHOLECYSTECTOMY     COLONOSCOPY     COLONOSCOPY WITH PROPOFOL  N/A 03/04/2017   Procedure: COLONOSCOPY WITH PROPOFOL ;  Surgeon: Golda Claudis PENNER, MD;  [facility];  Service: Endoscopy;  Laterality: N/A;  10:10   COLONOSCOPY WITH PROPOFOL  N/A 02/24/2023   Procedure: COLONOSCOPY WITH PROPOFOL ;  Surgeon: Eartha Angelia Sieving, MD;  [facility];  Service: Gastroenterology;  Laterality: N/A;  11:00AM;ASA 1-2   ESOPHAGEAL DILATION  06/07/2014   Procedure: ESOPHAGEAL DILATION;  Surgeon: Claudis PENNER Golda, MD;  [facility];  Service: Endoscopy;;   ESOPHAGEAL DILATION N/A 08/27/2015   Procedure: ESOPHAGEAL DILATION;  Surgeon: Claudis PENNER Golda, MD;  [facility];  Service: Endoscopy;  Laterality: N/A;   ESOPHAGEAL DILATION  08/04/2016   Procedure: ESOPHAGEAL DILATION;  Surgeon: Claudis PENNER Golda, MD;  [facility];  Service: Endoscopy;;   ESOPHAGEAL DILATION N/A 04/13/2017   Procedure: ESOPHAGEAL DILATION;  Surgeon: Golda Claudis PENNER, MD;  [facility];  Service: Endoscopy;  Laterality: N/A;   ESOPHAGEAL DILATION N/A 01/21/2022   Procedure: ESOPHAGEAL DILATION;  Surgeon: Golda Claudis PENNER, MD;  [facility];  Service: Endoscopy;  Laterality: N/A;   ESOPHAGOGASTRODUODENOSCOPY  02/04/2012   Procedure: ESOPHAGOGASTRODUODENOSCOPY (EGD);  Surgeon: Claudis PENNER Golda, MD;  [facility];  Service: Endoscopy;  Laterality: N/A;  320   ESOPHAGOGASTRODUODENOSCOPY  06/02/2012   Procedure: ESOPHAGOGASTRODUODENOSCOPY (EGD);  Surgeon: Claudis PENNER Golda, MD;  [facility];  Service: Endoscopy;  Laterality: N/A;  1050   ESOPHAGOGASTRODUODENOSCOPY N/A 06/07/2014   Procedure: ESOPHAGOGASTRODUODENOSCOPY (EGD);  Surgeon: Claudis PENNER Golda, MD;  [facility];  Service: Endoscopy;  Laterality: N/A;   1035-rescheduled 9/25 @ 8:30 Ann to notify pt   ESOPHAGOGASTRODUODENOSCOPY N/A 02/17/2015   Procedure: ESOPHAGOGASTRODUODENOSCOPY (EGD);  Surgeon: Claudis PENNER Golda, MD;  [facility];  Service: Endoscopy;  Laterality: N/A;  730   ESOPHAGOGASTRODUODENOSCOPY N/A 08/27/2015   Procedure: ESOPHAGOGASTRODUODENOSCOPY (EGD);  Surgeon: Claudis PENNER Golda, MD;  [facility];  Service: Endoscopy;  Laterality: N/A;  1155   ESOPHAGOGASTRODUODENOSCOPY N/A 08/04/2016   Procedure: ESOPHAGOGASTRODUODENOSCOPY (EGD);  Surgeon: Claudis PENNER Golda, MD;  [facility];  Service: Endoscopy;  Laterality: N/A;  225   ESOPHAGOGASTRODUODENOSCOPY N/A 04/13/2017   Procedure: ESOPHAGOGASTRODUODENOSCOPY (EGD);  Surgeon: Golda Claudis PENNER, MD;  [facility];  Service: Endoscopy;  Laterality: N/A;   ESOPHAGOGASTRODUODENOSCOPY (EGD) WITH ESOPHAGEAL DILATION N/A 05/31/2013   Procedure: ESOPHAGOGASTRODUODENOSCOPY (EGD) WITH ESOPHAGEAL DILATION;  Surgeon: Claudis PENNER Golda, MD;  [facility];  Service: Endoscopy;  Laterality: N/A;   ESOPHAGOGASTRODUODENOSCOPY (EGD) WITH PROPOFOL  N/A 01/14/2021   Procedure: ESOPHAGOGASTRODUODENOSCOPY (EGD) WITH PROPOFOL ;  Surgeon: Golda Claudis PENNER, MD;  [facility];  Service: Endoscopy;  Laterality: N/A;  11:00 Am   ESOPHAGOGASTRODUODENOSCOPY (EGD) WITH PROPOFOL  N/A 01/21/2022   Procedure: ESOPHAGOGASTRODUODENOSCOPY (EGD) WITH PROPOFOL ;  Surgeon: Golda Claudis PENNER, MD;  [facility];  Service: Endoscopy;  Laterality: N/A;  830  FLEXIBLE SIGMOIDOSCOPY N/A 05/31/2013   Procedure: FLEXIBLE SIGMOIDOSCOPY;  Surgeon: Claudis RAYMOND Rivet, MD;  [facility];  Service: Endoscopy;  Laterality: N/A;  250   FLEXIBLE SIGMOIDOSCOPY N/A 06/07/2014   Procedure: FLEXIBLE SIGMOIDOSCOPY;  Surgeon: Claudis RAYMOND Rivet, MD;  [facility];  Service: Endoscopy;  Laterality: N/A;   GIVENS CAPSULE STUDY  05/03/2011   Procedure: GIVENS CAPSULE STUDY;   Surgeon: Claudis RAYMOND Rivet, MD;  [facility];  Service: Endoscopy;  Laterality: N/A;  7:30 am   STOMACH SURGERY     2015- ulcer surgery.   UPPER GASTROINTESTINAL ENDOSCOPY      Family History: Family History  Problem Relation Age of Onset   Healthy Mother    Heart attack Father    Healthy Sister    Colon cancer Neg Hx     Social History: Social History   Tobacco Use  Smoking Status Never   Passive exposure: Never  Smokeless Tobacco Never   Social History   Substance and Sexual Activity  Alcohol Use No   Alcohol/week: 0.0 standard drinks of alcohol   Social History   Substance and Sexual Activity  Drug Use No    Allergies: Allergies  Allergen Reactions   Aspirin Other (See Comments)    Ulcer, Reflux   Codeine Nausea And Vomiting    Speeds heart up, dizziness   Fentanyl Nausea And Vomiting    Confusion, weakness   Nsaids Other (See Comments)    reflux, ulcer   Propoxyphene N-Acetaminophen  Nausea And Vomiting    Speeds heart up, dizziness    Medications: Current Outpatient Medications  Medication Sig Dispense Refill   acetaminophen  (TYLENOL ) 500 MG tablet Take 500 mg by mouth every 6 (six) hours as needed for headache.     ALPRAZolam  (XANAX ) 0.25 MG tablet Take 1 tablet (0.25 mg total) by mouth 3 (three) times daily as needed. 90 tablet 0   Ascorbic Acid (VITAMIN C WITH ROSE HIPS) 1000 MG tablet Take 1,000 mg by mouth daily.     Cholecalciferol (VITAMIN D3) 5000 UNITS CAPS Take 5,000 Units by mouth daily.      dicyclomine  (BENTYL ) 10 MG capsule Take 1 capsule (10 mg total) by mouth every 12 (twelve) hours as needed (abdominal pain). 180 capsule 0   diphenoxylate -atropine  (LOMOTIL ) 2.5-0.025 MG tablet Take 1 tablet by mouth 4 (four) times daily as needed for diarrhea or loose stools. 120 tablet 2   ferrous gluconate (FERGON) 324 MG tablet Take 324 mg by mouth 2 (two) times daily with a meal.     metoprolol  tartrate (LOPRESSOR ) 25 MG tablet Take 1  tablet (25 mg total) by mouth 2 (two) times daily. 180 tablet 3   Multiple Vitamin (MULTIVITAMIN WITH MINERALS) TABS tablet Take 1 tablet by mouth daily.     ondansetron  (ZOFRAN ) 4 MG tablet Take 1 tablet (4 mg total) by mouth 2 (two) times daily as needed for nausea or vomiting. 60 tablet 1   pantoprazole  (PROTONIX ) 40 MG tablet take (1) tablet daily before breakfast. 90 tablet 0   promethazine  (PHENADOZ) 25 MG suppository Place 1 suppository (25 mg total) rectally 2 (two) times daily as needed for nausea or vomiting. 12 each 1   promethazine  (PHENERGAN ) 25 MG tablet Take 1 tablet (25 mg total) by mouth 2 (two) times daily as needed for nausea or vomiting. 20 tablet 0   raloxifene (EVISTA) 60 MG tablet Take 60 mg by mouth daily.     rosuvastatin (CRESTOR)  5 MG tablet Take 5 mg by mouth daily.     Simethicone  (PHAZYME) 180 MG CAPS Take 1 capsule (180 mg total) by mouth 2 (two) times daily as needed.  0   sucralfate  (CARAFATE ) 1 g tablet Take 1 tablet (1 g total) by mouth 4 (four) times daily -  with meals and at bedtime. As needed 180 tablet 1   vitamin B-12 (CYANOCOBALAMIN) 1000 MCG tablet Take 1,000 mcg by mouth daily.     zinc gluconate 50 MG tablet Take 50 mg by mouth daily.     No current facility-administered medications for this visit.    Review of Systems: GENERAL: negative for malaise, night sweats HEENT: No changes in hearing or vision, no nose bleeds or other nasal problems. NECK: Negative for lumps, goiter, pain and significant neck swelling RESPIRATORY: Negative for cough, wheezing CARDIOVASCULAR: Negative for chest pain, leg swelling, palpitations, orthopnea GI: SEE HPI MUSCULOSKELETAL: Negative for joint pain or swelling, back pain, and muscle pain. SKIN: Negative for lesions, rash PSYCH: Negative for sleep disturbance, mood disorder and recent psychosocial stressors. HEMATOLOGY Negative for prolonged bleeding, bruising easily, and swollen nodes. ENDOCRINE: Negative for cold  or heat intolerance, polyuria, polydipsia and goiter. NEURO: negative for tremor, gait imbalance, syncope and seizures. The remainder of the review of systems is noncontributory.   Physical Exam: BP 103/68 (BP Location: Left Arm, Patient Position: Sitting, Cuff Size: Normal)   Pulse 73   Temp 97.8 F (36.6 C) (Temporal)   Ht 5' 4 (1.626 m)   Wt 118 lb (53.5 kg)   BMI 20.25 kg/m  GENERAL: The patient is AO x3, in no acute distress. HEENT: Head is normocephalic and atraumatic. EOMI are intact. Mouth is well hydrated and without lesions. NECK: Supple. No masses LUNGS: Clear to auscultation. No presence of rhonchi/wheezing/rales. Adequate chest expansion HEART: RRR, normal s1 and s2. ABDOMEN: Soft, nontender, no guarding, no peritoneal signs, and nondistended. BS +. No masses. EXTREMITIES: Without any cyanosis, clubbing, rash, lesions or edema. NEUROLOGIC: AOx3, no focal motor deficit. SKIN: no jaundice, no rashes  Imaging/Labs: as above  I personally reviewed and interpreted the available labs, imaging and endoscopic files.  Impression and Plan: Lindsay Lawrence is a 68 y.o. female with IBS-D, gastrectomy induced gastroparesis,GERD, anxiety, who presents for follow up of IBS-D , dysphagia and gastroparesis.  Patient has presented relatively stable symptoms has her diarrhea has not worsened and has been controlled with the use of dicyclomine  and Lomotil  as needed.  She should continue with this regimen for now.  She will also need to continue eating small portions to relieve her surgical related gastroparesis symptoms.  -Continue with dicyclomine  every 8-12 hours for abdominal pain episodes -Eat small portions every day, 5-6 times per day -Continue with Lomotil  as needed for diarrhea  All questions were answered.      Toribio Fortune, MD Gastroenterology and Hepatology [facility]

## 2024-09-14 ENCOUNTER — Other Ambulatory Visit: Payer: Self-pay | Admitting: Nurse Practitioner

## 2024-10-19 ENCOUNTER — Telehealth (INDEPENDENT_AMBULATORY_CARE_PROVIDER_SITE_OTHER): Payer: Self-pay | Admitting: Gastroenterology

## 2024-10-19 NOTE — Telephone Encounter (Signed)
 Fax from [facility] requesting refill on Pantoprazole  40 mg tablets. Take one tablet before breakfast. Pt has only seen Dr.Castaneda in the past. Last seen in November. Can we refill in your name or should I send to Dr.Ahmed? No upcoming appts at this time. Please advise. Thank you

## 2024-11-14 ENCOUNTER — Ambulatory Visit: Admitting: Cardiology
